# Patient Record
Sex: Female | Born: 1935 | Race: White | Hispanic: No | Smoking: Former smoker
Health system: Southern US, Community
[De-identification: ages and names within clinical notes are randomized; demographics above are authoritative.]

## PROBLEM LIST (undated history)

## (undated) DIAGNOSIS — N189 Chronic kidney disease, unspecified: Secondary | ICD-10-CM

## (undated) DIAGNOSIS — E0590 Thyrotoxicosis, unspecified without thyrotoxic crisis or storm: Secondary | ICD-10-CM

## (undated) DIAGNOSIS — J449 Chronic obstructive pulmonary disease, unspecified: Secondary | ICD-10-CM

## (undated) DIAGNOSIS — K275 Chronic or unspecified peptic ulcer, site unspecified, with perforation: Secondary | ICD-10-CM

## (undated) DIAGNOSIS — G4700 Insomnia, unspecified: Secondary | ICD-10-CM

## (undated) DIAGNOSIS — I10 Essential (primary) hypertension: Secondary | ICD-10-CM

## (undated) HISTORY — DX: Chronic or unspecified peptic ulcer, site unspecified, with perforation: K27.5

## (undated) HISTORY — DX: Chronic kidney disease, unspecified: N18.9

## (undated) HISTORY — PX: TUMOR REMOVAL: SHX12

---

## 2003-03-06 ENCOUNTER — Other Ambulatory Visit: Admission: RE | Admit: 2003-03-06 | Discharge: 2003-03-06 | Payer: Self-pay | Admitting: Family Medicine

## 2003-03-11 ENCOUNTER — Encounter: Payer: Self-pay | Admitting: Family Medicine

## 2003-03-11 ENCOUNTER — Encounter: Admission: RE | Admit: 2003-03-11 | Discharge: 2003-03-11 | Payer: Self-pay | Admitting: Family Medicine

## 2003-05-21 ENCOUNTER — Ambulatory Visit (HOSPITAL_COMMUNITY): Admission: RE | Admit: 2003-05-21 | Discharge: 2003-05-21 | Payer: Self-pay | Admitting: Gastroenterology

## 2003-05-21 ENCOUNTER — Encounter (INDEPENDENT_AMBULATORY_CARE_PROVIDER_SITE_OTHER): Payer: Self-pay

## 2004-07-25 ENCOUNTER — Encounter: Admission: RE | Admit: 2004-07-25 | Discharge: 2004-07-25 | Payer: Self-pay | Admitting: Neurology

## 2004-08-17 ENCOUNTER — Encounter: Admission: RE | Admit: 2004-08-17 | Discharge: 2004-08-17 | Payer: Self-pay | Admitting: Neurology

## 2004-11-06 DIAGNOSIS — D352 Benign neoplasm of pituitary gland: Secondary | ICD-10-CM | POA: Insufficient documentation

## 2004-11-08 ENCOUNTER — Ambulatory Visit: Payer: Self-pay | Admitting: *Deleted

## 2004-11-17 ENCOUNTER — Ambulatory Visit: Payer: Self-pay

## 2004-12-19 ENCOUNTER — Inpatient Hospital Stay (HOSPITAL_COMMUNITY): Admission: RE | Admit: 2004-12-19 | Discharge: 2004-12-23 | Payer: Self-pay | Admitting: Neurosurgery

## 2004-12-19 ENCOUNTER — Encounter (INDEPENDENT_AMBULATORY_CARE_PROVIDER_SITE_OTHER): Payer: Self-pay | Admitting: Specialist

## 2004-12-21 ENCOUNTER — Ambulatory Visit: Payer: Self-pay | Admitting: Internal Medicine

## 2004-12-21 ENCOUNTER — Encounter: Payer: Self-pay | Admitting: Cardiology

## 2005-03-08 ENCOUNTER — Ambulatory Visit (HOSPITAL_COMMUNITY): Admission: RE | Admit: 2005-03-08 | Discharge: 2005-03-08 | Payer: Self-pay | Admitting: Neurosurgery

## 2005-06-11 ENCOUNTER — Ambulatory Visit (HOSPITAL_COMMUNITY): Admission: RE | Admit: 2005-06-11 | Discharge: 2005-06-11 | Payer: Self-pay | Admitting: Neurosurgery

## 2005-08-18 ENCOUNTER — Other Ambulatory Visit: Admission: RE | Admit: 2005-08-18 | Discharge: 2005-08-18 | Payer: Self-pay | Admitting: Family Medicine

## 2005-12-12 ENCOUNTER — Ambulatory Visit (HOSPITAL_COMMUNITY): Admission: RE | Admit: 2005-12-12 | Discharge: 2005-12-12 | Payer: Self-pay | Admitting: Neurosurgery

## 2006-02-27 ENCOUNTER — Encounter: Admission: RE | Admit: 2006-02-27 | Discharge: 2006-02-27 | Payer: Self-pay | Admitting: Family Medicine

## 2006-08-25 ENCOUNTER — Encounter: Admission: RE | Admit: 2006-08-25 | Discharge: 2006-08-25 | Payer: Self-pay | Admitting: Neurosurgery

## 2006-12-11 ENCOUNTER — Encounter: Admission: RE | Admit: 2006-12-11 | Discharge: 2006-12-11 | Payer: Self-pay | Admitting: Endocrinology

## 2007-10-14 ENCOUNTER — Encounter: Admission: RE | Admit: 2007-10-14 | Discharge: 2007-10-14 | Payer: Self-pay | Admitting: Neurosurgery

## 2008-10-18 ENCOUNTER — Encounter: Admission: RE | Admit: 2008-10-18 | Discharge: 2008-10-18 | Payer: Self-pay | Admitting: Neurosurgery

## 2009-07-08 ENCOUNTER — Encounter: Admission: RE | Admit: 2009-07-08 | Discharge: 2009-10-06 | Payer: Self-pay | Admitting: Neurology

## 2010-02-26 ENCOUNTER — Encounter: Admission: RE | Admit: 2010-02-26 | Discharge: 2010-02-26 | Payer: Self-pay | Admitting: Neurosurgery

## 2010-08-30 ENCOUNTER — Encounter: Admission: RE | Admit: 2010-08-30 | Discharge: 2010-08-30 | Payer: Self-pay | Admitting: Neurosurgery

## 2010-11-28 ENCOUNTER — Encounter: Payer: Self-pay | Admitting: Family Medicine

## 2010-12-12 LAB — HEMOGLOBIN A1C: Hgb A1c MFr Bld: 5.3 % (ref 4.0–6.0)

## 2011-01-21 ENCOUNTER — Encounter: Payer: Self-pay | Admitting: Nurse Practitioner

## 2011-01-21 DIAGNOSIS — K635 Polyp of colon: Secondary | ICD-10-CM

## 2011-01-21 DIAGNOSIS — E210 Primary hyperparathyroidism: Secondary | ICD-10-CM

## 2011-01-21 DIAGNOSIS — D352 Benign neoplasm of pituitary gland: Secondary | ICD-10-CM

## 2011-01-21 DIAGNOSIS — E785 Hyperlipidemia, unspecified: Secondary | ICD-10-CM | POA: Insufficient documentation

## 2011-01-21 DIAGNOSIS — I10 Essential (primary) hypertension: Secondary | ICD-10-CM

## 2011-01-21 DIAGNOSIS — E119 Type 2 diabetes mellitus without complications: Secondary | ICD-10-CM | POA: Insufficient documentation

## 2011-03-24 NOTE — Op Note (Signed)
April [name], April [name]                           ACCOUNT NO.:  [id number]   MEDICAL RECORD NO.:  [id number]                   PATIENT TYPE:  AMB   LOCATION:  ENDO                                 FACILITY:  [facility]   PHYSICIAN:  Petra Kuba, M.D.                 DATE OF BIRTH:  04/28/1935   DATE OF PROCEDURE:  05/21/2003  DATE OF DISCHARGE:                                 OPERATIVE REPORT   PROCEDURE PERFORMED:  Colonoscopy.   INDICATIONS FOR PROCEDURE:  A patient with constipation due for colonic  screening.  Consent was signed after risks and benefits, methods and options  were thoroughly discussed in the office.   MEDICINES USED:  Demerol 50, Versed 5.   DESCRIPTION OF PROCEDURE:  The rectal inspection was pertinent for external  hemorrhoids, small.  Digital exam was negative.  The video pediatric  adjustable colonoscope was inserted.  There was some difficulty due to  looping.  It was able to be advanced to a level of the ileocecal valve.  This required rolling her on her back and some abdominal pressure.  Unfortunately, we could not advance around the ileocecal valve since it took  a sharp turn to the cecal pole.  We did try rolling her in all three  positions and various abdominal pressures.  At that point, we elected to  withdraw.  On slow withdrawal through the colon, the ascending, transverse  and descending and a majority of the sigmoid were all normal.  In the distal  sigmoid and rectum, some tiny hyperplastic-appearing polyps were seen and  they were cold biopsied.  Anorectum both forward and retroflexion confirmed  some hemorrhoids.  The air was suctioned.  The scope removed.  The patient  tolerated the procedure well.  There was no obvious immediate complication.   ENDOSCOPIC DIAGNOSES:  1. Internal and external hemorrhoids.  2. Hyperplastic-appearing rectal distal sigmoid polyp, cold biopsied.  3. Otherwise within normal limits to the ileocecal valve.  Unable to   advanced in the cecum.    PLAN:  Await pathology to determine future colonic screening.  Consider an  air-contrast barium enema p.r.n. or if repeat colonoscopy in the future is  due, is unsuccessful.  Will be happy to see back p.r.n. otherwise for her  chronic constipation, would leave the following suggestions with Dr. Reola Calkins to  include MiraLax, Crystallose or even a trial of Zelnorm and will be happy to  see back as above.                                                 Petra Kuba, M.D.    MEM/MEDQ  D:  05/21/2003  T:  05/21/2003  Job:  [id number]   cc:   Tawanna Cooler  Reola Calkins, MD  [phone] [address]  [place]  [place] [address]  Fax: [phone]

## 2011-03-24 NOTE — Consult Note (Signed)
NAMEOPLE, GIRGIS                 ACCOUNT NO.:  [id number]   MEDICAL RECORD NO.:  [id number]          PATIENT TYPE:  INP   LOCATION:  3113                         FACILITY:  [facility]   PHYSICIAN:  Arvilla Meres, M.D. [facility] OF BIRTH:  [birth year]   DATE OF CONSULTATION:  12/21/2004  DATE OF DISCHARGE:                                   CONSULTATION   CONSULTING PHYSICIAN:  Arvilla Meres, M.D. [facility]   REQUESTING PHYSICIAN:  Hewitt Shorts, M.D.   PRIMARY CARE PHYSICIAN:  Dr. Lindaann Pascal, P.A.C.   CARDIOLOGIST:  Cecil Cranker, M.D.   PATIENT IDENTIFICATION:  Ms. Duncombe is a very pleasant 76 year old smoker  with no known history of CAD who we are asked to evaluate for asymptomatic  bradycardia in the setting of a recent transsphenoidal resection of a  pituitary adenoma.   Mr. Stanard denies any history of coronary disease or conduction disorders.  She was seen in our office, in January 2006, for preoperative cardiac  clearance prior to her resection of her adenoma.  At that time she underwent  an adenosine Cardiolite which showed an EF of 77% with no evidence of  infarct or ischemia and thus she was cleared for surgery.  She underwent her  pituitary adenoma resection yesterday without complication.  Postoperatively, she developed significant bradycardia with heart rates in  the 40-50 range, dropping down to the mid 30s while she was sleeping.  These  were totally asymptomatic.  There was no evidence of associated apnea.  She  denies any previous history of syncope or presyncope.  She is not receiving  any A-V nodal blockers.   PAST MEDICAL HISTORY:  1.  Pituitary adenoma, status post resection on December 19, 2004.  2.  Hypertension.  3.  Hyperlipidemia.  4.  Gallstones, status post cholecystectomy.   CURRENT MEDICATIONS:  1.  Lisinopril 10 mg a day.  2.  Hydrochlorothiazide 12.5 mg a day.  3.  Advicor.  4.  Pepcid IV q.12h.  5.  Rocephin.  6.  IV fluids.   She  has no known drug allergies.   SOCIAL HISTORY:  She lives in [place] alone.  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  She has  a history of tobacco, one pack per day x 45 years, quit last week.  No  alcohol.   She does have a significant family history of coronary disease.   REVIEW OF SYSTEMS:  Essentially negative.  She does have occasional  headaches.  She does snore but denies any history of sleep apnea.   PHYSICAL EXAMINATION:  GENERAL:  She is lying in bed in no acute distress.  VITAL SIGNS:  Temperature is 97.3, blood pressure is 124/36, her heart rate  is 48 and ranges between about 37 and 70.  HEENT:  Sclerae are anicteric.  EOMI.  She has nasal packing.  NECK:  Supple.  There is no JVD.  She has soft carotid bruits bilaterally  which are likely radiated form her aortic valve.  CARDIAC:  She has a regular rate and rhythm with a soft 2/6 early peaking  murmur at the right sternal border.  Her ST is well preserved.  There is no  rub or gallop.  LUNGS:  Clear to auscultation.  ABDOMEN:  Soft, nontender, nondistended.  EXTREMITIES:  Warm with no cyanosis, clubbing, or edema.  Her distal pulses  are 1+ bilaterally.  MENTAL STATUS:  She is alert and oriented  x 3.  She is otherwise intact.   LABS:  Show a white count of 10.2, hemoglobin 13.6, hematocrit 40%,  platelets are 226.  Sodium is 141, potassium 4.4, chloride is 112, bicarb is  26, BUN is 10, creatinine is 1.0, and glucose is 162.  EKG, from November 08, 2004, shows a normal sinus rhythm at a rate of 64 with normal intervals.  There is no ST-T wave changes.   ASSESSMENT/PLAN:  Ms. Glendenning is a very pleasant 76 year old woman as above  with no known coronary disease or a history of conduction disorders.  She is  now experiencing some asymptomatic bradycardia with heart rates down into  the 30s, status post a recent resection of a pituitary adenoma.  At this  point, I think her bradycardia is likely benign.  However, I would watch  very  closely for other signs of increased intracranial pressure as this may  be an early warning sign.  Otherwise I would just continue to follow her  conservatively on telemetry.  I would be sure to hold all A-V nodal blockers  such as beta-blockers and calcium channel blockers.  I would also recommend  checking an echocardiogram as well as carotid Doppler studies to evaluate  her bruits and check a TSH.  There is no indication for a pacemaker at this  time.  Finally, I would keep her on telemetry and keep Zoll pads and  atropine at the bedside.   RECOMMENDATIONS:  1.  Continue to monitor on telemetry.  2.  Keep Zoll pads and atropine at the bedside.  3.  Watch closely for other signs of increased intracranial pressure.  4.  Check echocardiogram, carotid ultrasound, and TSH.  5.  Hold all A-V nodal blockers.  6.  We will continue to follow with you.      DB/MEDQ  D:  12/21/2004  T:  12/21/2004  Job:  [id number]   cc:   Hewitt Shorts, M.D.  [address]  [place]  [place] [address]  Fax: [phone]   Lindaann Pascal, P.A.C.   Cecil Cranker, M.D.

## 2011-03-24 NOTE — Op Note (Signed)
NAMELAVELLE, BERLAND                 ACCOUNT NO.:  [id number]   MEDICAL RECORD NO.:  [id number]          PATIENT TYPE:  INP   LOCATION:  2899                         FACILITY:  [facility]   PHYSICIAN:  Zola Button T. Lazarus Salines, M.D. DATE OF BIRTH:  Oct 12, 1935   DATE OF PROCEDURE:  12/19/2004  DATE OF DISCHARGE:                                 OPERATIVE REPORT   PREOPERATIVE DIAGNOSIS:  Pituitary tumor.   POSTOPERATIVE DIAGNOSIS:  Pituitary tumor.   PROCEDURE PERFORMED:  Otolaryngology: Transnasal, transseptal,  transsphenoidal approach to a pituitary tumor.   SURGEON:  Gloris Manchester. Lazarus Salines, M.D.   ANESTHESIA:  General orotracheal.   BLOOD LOSS:  Minimal.   COMPLICATIONS:  None.   FINDINGS:  An overall relatively small fleshy nose. Straight septum. A small  sphenoid sinus with a straight midline intrasinus septum.  An enlarged sella  with bone breakdown on the anterior and inferior faces of the sella on both  sides of the midline.   PROCEDURE:  With the patient in the comfortable supine position, general  orotracheal anesthesia was induced without difficulty.  At an appropriate  level, the table was turned 90 degrees. The patient placed a slight sitting  position. The patient was appropriate position and the C-arm apparatus  placed and aimed.   A saline moistened throat pack was placed. Nasal vibrissae were trimmed.  Cocaine crystals, 200 milligrams total were applied on cotton carriers to  the anterior ethmoid and sphenopalatine ganglion regions on both sides.  Cocaine solution, 160 milligrams total was applied on 1/2 x 3 inch  cottonoids to the nasal septum on both sides. 1% Xylocaine with 1:100,000  epinephrine, 12 cc total infiltrated into the anterior floor of the nose,  into the nasal spine region, into the membranous columella, and into the sub  mucoperichondrial plane of the septum on both sides. Several minutes were  allowed to this to take effect. A sterile preparation and draping  of the  midface, and the right lower quadrant of the abdomen were prepared.   The materials were removed from the nose and observed to be intact and  correct in number. Findings were as described above. A left-sided  hemitransfixion incision was sharply executed and carried down to a floor  tunnel. This was carried to the caudal ends of the quadrangular cartilage. A  small right floor incision was executed. Floor tunnels were generated on  both sides, brought back to the vomer and brought forward. The sub  mucoperichondrial plane of the left septum was elevated back onto the  perpendicular plate and brought down and communicated with the floor tunnel  and brought forward along the maxillary crest. The left septal flap was  elevated intact. The chondroethmoid junction was identified and opened with  the Cottle elevator and the opposite submucoperiosteal plane of the septum  was developed. The superior perpendicular plate was lysed with an open  Jansen-Middleton forceps. Inferior portion was submucosally dissected,  rocked free with a closed Jansen-Middleton forceps and delivered. At this  point, the septum was dislocated from the maxillary crest. The floor tunnel  on  the left from the right side was communicated with the septal  dislocation. Septum was replaced.   The columella was cleaned and using a preexisting skin wrinkle, an incision  was planned at the root of the columella and then sharply executed. This was  communicated in the left nasal vestibule with the floor incision and on the  right side with a hemitransfixion incision. The columella was amputated  across the lower lateral cartilages. The area in the nasal spine was  dissected and at this point the columella and septum were swung to the right  and the septal flap was swung to the left. Small amount of cautery rendered  this region hemostatic.   The short Hardy retractor was passed into the nose and the mucosa was   dissected away from the bony face of the sphenoid sinus. The natural ostia  were identified. Using mallet and osteotome, the rostrum of the sphenoid was  opened and delivered in one piece. It was saved for later use. The bony  aperture into the sphenoid sinus was enlarged using Kerrison forceps in  various directions. The mucosa was dissected off of the midline septum and  then back onto the face of the sella. The bone was dehiscent and the mucosa  was adherent to the dura. The midline intrasinus septum was broken down with  a pituitary forceps. Upon stripping the mucosa into the lateral recesses as  much as possible, the procedure was turned over to neurosurgery for actual  opening of the sella and evacuation of the tumor.   After completion of the neurosurgical portion of the procedure, including  packing of the sella with fat and securing this with a piece of septal  cartilage, the procedure was returned to otolaryngology. The sphenoid sinus  was suctioned clear and additional fat was packed into both sides. The  rostrum was replaced in the midline. Septal tunnels were suctioned clear.  The Hardy retractor was relaxed and removed. The septal flaps were brought  back down. The septum was secured to the nasal spine with a figure-of-eight  4-0 PDS suture.   The columella was carefully cleaned and the base was reapproximated with  buried 4-0 chromic sutures and finally 6-0 Ethilon sutures externally for  cosmetic purposes and 4-0 chromic sutures into the nasal vestibule. A small  incision was made in the posterior left septal flap to allow drainage of  small amounts of blood. At this point the intranasal incisions were closed  with interrupted 4-0 chromic suture. The septum was straight and in the  midline. Hemostasis was observed.   A triple thickness Telfa pack impregnated with bacitracin ointment and a 2-0 silk tag was placed high in the nose on each side between the middle  turbinate  and septum. The same material was used to make a larger pack  inferiorly on both sides.   Note that before placing the nasal packs, a piece of 0.040 reinforced  Silastic was fashioned and placed against the septum on both sides and  secured thereto with a 3-0 Ethilon stitch.   At this point the procedure was completed. The pharynx was suctioned free  and the throat pack was removed. The patient was returned to Anesthesia,  awakened, extubated, and transferred to recovery in stable condition.   COMMENT:  A 76 year old white female found to have a large pituitary tumor  while undergoing workup for possible early Parkinsons disease hence the  indication for today's procedure. Anticipate a routine postoperative  recovery  with attention to ice, elevation, analgesia, and antistaphylococcal  antibiosis. Will leave the sutures and nasal packs in place for several days  and the septal splints for 10 days.      KTW/MEDQ  D:  12/19/2004  T:  12/19/2004  Job:  [id number]   cc:   Hewitt Shorts, M.D.  [address]  [place]  [place] [address]  Fax: [phone]   Dr. Annamary Rummage, M.D.  [address]  [place]  [place] [address]  Fax: [phone]   Pramod P. Pearlean Brownie, MD  Fax: [phone]   Tyrone Apple. Karleen Hampshire, M.D.  [address]  [place]  [place] [address]  Fax: [phone]

## 2011-03-24 NOTE — Discharge Summary (Signed)
NAMEMARGHERITA, [name]                 ACCOUNT NO.:  [id number]   MEDICAL RECORD NO.:  [id number]          PATIENT TYPE:  INP   LOCATION:  3020                         FACILITY:  [facility]   PHYSICIAN:  Hewitt Shorts, M.D.DATE OF BIRTH:  12-Mar-1935   DATE OF ADMISSION:  12/19/2004  DATE OF DISCHARGE:  12/23/2004                                 DISCHARGE SUMMARY   Patient is a 76 year old woman who presents with a pituitary macroadenoma.  This was found while she was undergoing a work-up for early parkinsonism.  The lesion was 2 x 3 x 2 cm extending with mostly growth caudally, although  extending into the suprasellar cistern and abutted, but did not compress the  optic apparatus.  Patient was seen in new ophthalmologic evaluation by Dr.  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  Dr. Lucianne Muss evaluated  her and found on evidence of hypersecretion or hyposecretion.  She underwent  preoperative cardiology clearance with Dr. Graceann Congress and was cleared  for surgery from a cardiac perspective.  Her history is notable for  hypertension, hypercholesterolemia, parkinsonism, and her pituitary tumor.   PREOPERATIVE MEDICATIONS:  1.  Zestoretic 10/12.5 daily.  2.  Advicor 500/20 q.h.s.   PHYSICAL EXAMINATION:  GENERAL:  Unremarkable.  NEUROLOGIC:  Patient neurologically intact other than for some peripheral  visual field encroachment.   HOSPITAL COURSE:  Patient was admitted, underwent transsphenoidal resection  of pituitary tumor.  This procedure was performed in a combined fashion  between the neurosurgical and ENT services, specifically Dr. Newell Coral and  Dr. Lazarus Salines.  Procedure went well.  Postoperatively patient was cared for in  the intensive care unit.  She had no evidence of diabetes insipidus and her  electrolytes remained stable through the hospital course.  However, she did  have some asymptomatic bradycardia with heart rates into the 40s and [facility] was  reconsulted and she was seen by Dr. Gala Romney.  She underwent  a number of laboratories including bilateral carotid Dopplers which showed  no evidence of stenosis.  She had normal TSH.  She had a cardiac  echocardiogram which showed ejection fraction of 55-65 and it is felt that  she had asymptomatic bradycardia which particular slowing during sleep but  no further intervention was recommended by her cardiologist.  She has had  little in the way of drainage onto the nasal drip pad.  She has been up and  actively walking in the halls.  She is afebrile.  Dr. Lazarus Salines wants to  remove her packings on February 20, that is one week following surgery and  she has been instructed to return to his office that day for removal of the  packings.  He has asked that antibiotics be administered until the packings  were removed and she has been given a prescription for Keflex 500 mg t.i.d.  15 tablets and no refills.  She has little in the way of discomfort and we  have advised that she Tylenol or Advil for discomfort.  We have discussed  activities following discharge.   DISCHARGE DIAGNOSES:  1.  Pituitary macroadenoma.  2.  Asymptomatic bradycardia.      RWN/MEDQ  D:  12/23/2004  T:  12/23/2004  Job:  [id number]   cc:   Zola Button T. Lazarus Salines, M.D.  [address] [place]  [place] [address]  Fax: [phone]   Cecil Cranker, M.D.

## 2011-03-24 NOTE — H&P (Signed)
NAMEABBEY, VEITH                 ACCOUNT NO.:  [id number]   MEDICAL RECORD NO.:  [id number]          PATIENT TYPE:  INP   LOCATION:  NA                           FACILITY:  [facility]   PHYSICIAN:  Hewitt Shorts, M.D.DATE OF BIRTH:  [birth year]   DATE OF ADMISSION:  DATE OF DISCHARGE:                                HISTORY & PHYSICAL   HISTORY OF PRESENT ILLNESS:  The patient is a 76 year old right-handed white  female who was evaluated for a pituitary macroadenoma.  She had been  undergoing a workup for early parkinsonism, including MRI of the brain,  which revealed the pituitary macroadenoma.  A dedicated MRI of the sella was  then performed, which demonstrated a lesion larger than 2 x 3 x 2 cm.  Most  of the growth extended caudally, although it certainly extended into the  suprasellar cistern and abuts into the space of the optic apparatus,  especially on the left side.  The patient describes some visual blurring  that bothers her near vision more than her distant vision that has been  going on for a year or two.  She describes some headaches, which may be just  related to tension due to the recent diagnosis and need for medical  attention.  Her weight is stable.  She denies any breast discharge or cold  intolerance.   The patient has undergone evaluation with Dr. Lazarus Salines for preoperative ENT  evaluation as well as with Dr. Lucianne Muss for endocrinologic evaluation and has  undergone neurophthalmologic evaluation.  She is admitted now for surgical  resection of her tumor.   PAST MEDICAL HISTORY:  Notable for a history of hypertension and  hypercholesterolemia, parkinsonism and a pituitary tumor, but no history of  myocardial infarction, cancer, stroke, diabetes, peptic ulcer disease or  lung disease.   Previous surgery includes a cholecystectomy in 1985.   She denies allergies to medications.   CURRENT MEDICATIONS:  1.  Zestoretic 10/12.5 mg one tablet daily.  2.  Advicor  500/20 mg one q.h.s.   FAMILY HISTORY:  Her mother died at age 13.  She had a stroke, heart attack  and diabetes.  Father died in his 77s of heart attack.   SOCIAL HISTORY:  The patient is divorced.  She smokes a pack a day.  She has  been smoking over 25 years.  She does not drink alcohol.  She denies a  history of substance abuse.   REVIEW OF SYSTEMS:  Notable for those as described in history of present  illness and past medical history, but her 14-point review of system sheet is  otherwise unremarkable.   PHYSICAL EXAMINATION:  GENERAL:  The patient is a well-developed, well-  nourished white female in no acute distress.  VITAL SIGNS:  Her height is 5 feet, weight 155 pounds.  Temperature is 98.1,  pulse 69, blood pressure 122/72, respiratory rate 18.  CHEST:  Lungs are clear to auscultation.  She has symmetrical respiratory  excursion.  CARDIAC:  Regular rate and rhythm, S1, S2, no murmur.  ABDOMEN:  Soft, nondistended, bowel sounds are  present.  EXTREMITIES:  No clubbing, cyanosis, or edema.  NEUROLOGIC:  Mental status:  The patient is awake, alert, fully oriented.  Speech is fluent, and she has good comprehension.  Cranial nerves reveal her  pupils to be equal, round and reactive to light and about 2.5 mm  bilaterally.  Extraocular movements are intact.  Visual fields may have some  peripheral encroachment, more so on the right than the left.  Facial  movement is symmetrical.  Hearing is present bilaterally.  The palate  movement is symmetrical.  Tongue is midline.  Motor examination shows 5/5  strength.  There is no drift in the upper extremities.  She has normal gait.  She has moderate instability on tandem gait.  Sensation is intact to  pinprick throughout.  Reflexes are symmetrical.   DIAGNOSTIC EVALUATION:  Dr. Aura Camps, the ophthalmologist, found  superior quadrant anopsia.  Dr. Lucianne Muss found no evidence of hypersecretion or  hyposecretion.  Dr. Lazarus Salines felt the  patient was a good candidate for  transsphenoidal resection of the pituitary tumor.   IMPRESSION:  Large pituitary macroadenoma, which extends laterally toward  the cavernous sinus and inferiorly toward the floor of the sphenoid sinus  but superiorly as well into the suprasellar cistern and abutting and  compressing the optic apparatus, more on the left than the right.   PLAN:  The patient will be admitted for transsphenoidal resection of  pituitary tumor done in conjunction with Dr. Lazarus Salines from the ENT service.  She understands that this may well be a staged procedure depending on our  ability to resect the most superior aspect of the tumor and if significant  residual tumor remains in the suprasellar region, a transcranial approach  through a left pterional craniotomy may be necessary.  The patient has  undergone preoperative cardiology clearance with [facility].  We  discussed the nature of surgery, typical surgery, hospitalization, overall  recuperation and limitations postoperatively, the need for postoperative  care in the intensive care unit, and risks of surgery including risks of  infection, bleeding, the possible need of transfusion, the risk of  neurologic dysfunction with loss of vision, paralysis and coma, the risk of  endocrinopathy with possible need for hormone replacement, and the risk of  CSF leak and possible need for further surgery, including the possibility of  craniotomy, the possible need for adjuvant therapy with radiation therapy,  and anesthetic risks of myocardial infarction, stroke, pneumonia and death.   After discussing all risks, she would like to ahead with surgery and is  admitted for such.      RWN/MEDQ  D:  12/19/2004  T:  12/19/2004  Job:  [id number]   cc:   Zola Button T. Lazarus Salines, M.D.  [address] [place]  [place] [address]  Fax: [phone]

## 2011-03-24 NOTE — Op Note (Signed)
NAMEODEAN, MCELWAIN                 ACCOUNT NO.:  [id number]   MEDICAL RECORD NO.:  [id number]          PATIENT TYPE:  INP   LOCATION:  2899                         FACILITY:  [facility]   PHYSICIAN:  Hewitt Shorts, M.D.DATE OF BIRTH:  18-Nov-1934   DATE OF PROCEDURE:  12/19/2004  DATE OF DISCHARGE:                                 OPERATIVE REPORT   PREOPERATIVE DIAGNOSIS:  Pituitary adenoma.   POSTOPERATIVE DIAGNOSIS:  Pituitary adenoma.   PROCEDURE:  Transsphenoidal resection of pituitary tumor.   SURGEONS:  Hewitt Shorts, M.D.  Gloris Manchester. Lazarus Salines, M.D.   ANESTHESIA:  General endotracheal.   INDICATIONS:  This is a 76 year old woman who was found to have a large  pituitary macroadenoma, with tumor extending caudally into the pseudo sinus  rostrally into the suprasellar cistern, and then laterally into the  cavernous sinuses.  Decision made to proceed with elective transsphenoidal  resection of pituitary tumor.   DESCRIPTION OF PROCEDURE:  The procedure was done in the combined fashion  between the ENT and neurosurgical services; specifically, Drs. Flo Shanks  and Shirlean Kelly.  This is a dictation of the neurosurgical portion of  the procedure.   The patient was brought to the operating room and placed under general  endotracheal anesthesia.  The patient was positioned on a [place] horseshoe  headrest, with the head gently tilted to the left.  Dr. Lazarus Salines prepped the  face and nose, and the staff prepped the patient's abdomen with Betadine  scrubbing solution; all of this was draped in a sterile fashion.  Dr.  Lazarus Salines performed the exposure into the sphenoid sinus, and identified the  expanded sella.  This portion of the procedure is dictated by Dr. Lazarus Salines.  However, he found that the sella itself was markedly expanded and that only  wall of the sella had been eroded and the mucosa at points was adherent to  the dura, and could not be fully removed.   The  microscope had been draped and brought into the field, and provided  additional magnification, illumination and visualization.  Once the exposure  to the sella was completed, I took over as the Careers adviser.  We made a  horizontal incision in the right side of the abdomen, and then dissection  was carried down through the subcutaneous tissue.  Subcutaneous fat was  harvested, to later be used as a graft.  Then hemostasis was established  with the use of electrocautery and then the incision was closed with  interrupted inverted 2-0 Vicryl sutures, closing the subcutaneous tissues  and subcuticular layer.  Dermabond closed the skin edges.   We examined the wall of the sella, much of it had been eroded.  It appeared  that the dura, although expanded, was intact.  The dura was opened in a  cruciate fashion, and tumor immediately extruded into the field.  Specimens  of tumor were collected and sent to pathology for permanent examination.  Then, using a variety of small ring curets, the tumor was resected.  We  first removed tumor caudally to the floor of the sella,  and then gradually  removed tumor laterally to the lateral walls of the sella.  Lastly, removed  tumor superiorly.   It appeared that the thickened arachnoid or diaphragm sellae gently billowed  down, and it is felt that a gross total resection of the visible tumor had  been achieved.  On several occasions, we allowed the cranial pulsations to  gently push down tumor; as this was done the more complete resection of the  tumor was achieved.  We then used some Gelfoam soaked in thrombin to help  establish hemostasis within the tumor cavity.  The Gelfoam was then removed  and replaced some of the subcutaneous fat from the abdomen in the tumor  resection cavity.  Then, a small piece of nasal cartilage was placed inside  of the dural opening to help buttress the subcutaneous fat that had been  implanted within the tumor resection cavity.    Then, the surgical procedure was turned back over to Dr. Lazarus Salines to perform  the closure from this sphenoid sinus exteriorly.   The procedure was tolerated well.   ESTIMATED BLOOD LOSS:  200 cc.   COUNTS:  Sponge and instrument count correct.   DISPOSITION:  Following surgery, the patient was reversed from anesthetic,  extubated and transferred to the recovery room for further care; which she  was drowsy but following commands and moving all four extremities.   SPECIMENS:  Tumor sent to pathology for permanent examination.      RWN/MEDQ  D:  12/19/2004  T:  12/19/2004  Job:  [id number]

## 2012-03-22 ENCOUNTER — Other Ambulatory Visit: Payer: Self-pay | Admitting: Nephrology

## 2012-03-26 ENCOUNTER — Other Ambulatory Visit: Payer: Self-pay

## 2012-03-27 ENCOUNTER — Other Ambulatory Visit (HOSPITAL_COMMUNITY): Payer: Self-pay | Admitting: Nephrology

## 2012-03-27 DIAGNOSIS — D369 Benign neoplasm, unspecified site: Secondary | ICD-10-CM

## 2012-03-28 ENCOUNTER — Ambulatory Visit
Admission: RE | Admit: 2012-03-28 | Discharge: 2012-03-28 | Disposition: A | Discharge status: Home / Self Care | Payer: PRIVATE HEALTH INSURANCE | Source: Ambulatory Visit | Attending: Nephrology | Admitting: Nephrology

## 2012-04-04 ENCOUNTER — Ambulatory Visit (HOSPITAL_COMMUNITY): Payer: Self-pay

## 2012-04-04 ENCOUNTER — Encounter (HOSPITAL_COMMUNITY)
Admission: RE | Admit: 2012-04-04 | Discharge: 2012-04-04 | Disposition: A | Discharge status: Home / Self Care | Payer: PRIVATE HEALTH INSURANCE | Source: Ambulatory Visit | Attending: Nephrology | Admitting: Nephrology

## 2012-04-04 DIAGNOSIS — E213 Hyperparathyroidism, unspecified: Secondary | ICD-10-CM | POA: Insufficient documentation

## 2012-04-04 DIAGNOSIS — D369 Benign neoplasm, unspecified site: Secondary | ICD-10-CM

## 2012-04-04 MED ORDER — TECHNETIUM TC 99M SESTAMIBI - CARDIOLITE
25.0000 | Freq: Once | INTRAVENOUS | Status: AC | PRN
  Administered 2012-04-04: 25 via INTRAVENOUS

## 2012-06-10 ENCOUNTER — Emergency Department (HOSPITAL_COMMUNITY): Payer: PRIVATE HEALTH INSURANCE

## 2012-06-10 ENCOUNTER — Inpatient Hospital Stay (HOSPITAL_COMMUNITY)
Admission: EM | Admit: 2012-06-10 | Discharge: 2012-06-24 | DRG: 326 | Disposition: A | Discharge status: Skilled Nursing Facility | Payer: PRIVATE HEALTH INSURANCE | Attending: General Surgery | Admitting: General Surgery

## 2012-06-10 ENCOUNTER — Encounter (HOSPITAL_COMMUNITY): Payer: Self-pay | Admitting: Anesthesiology

## 2012-06-10 ENCOUNTER — Encounter (HOSPITAL_COMMUNITY): Admission: EM | Disposition: A | Discharge status: Skilled Nursing Facility | Payer: Self-pay | Source: Home / Self Care

## 2012-06-10 ENCOUNTER — Emergency Department (HOSPITAL_COMMUNITY): Payer: PRIVATE HEALTH INSURANCE | Admitting: Anesthesiology

## 2012-06-10 ENCOUNTER — Encounter (HOSPITAL_COMMUNITY): Payer: Self-pay

## 2012-06-10 DIAGNOSIS — R578 Other shock: Secondary | ICD-10-CM | POA: Diagnosis not present

## 2012-06-10 DIAGNOSIS — R911 Solitary pulmonary nodule: Secondary | ICD-10-CM | POA: Diagnosis present

## 2012-06-10 DIAGNOSIS — J449 Chronic obstructive pulmonary disease, unspecified: Secondary | ICD-10-CM

## 2012-06-10 DIAGNOSIS — D352 Benign neoplasm of pituitary gland: Secondary | ICD-10-CM | POA: Diagnosis present

## 2012-06-10 DIAGNOSIS — J9601 Acute respiratory failure with hypoxia: Secondary | ICD-10-CM

## 2012-06-10 DIAGNOSIS — J95821 Acute postprocedural respiratory failure: Secondary | ICD-10-CM | POA: Diagnosis not present

## 2012-06-10 DIAGNOSIS — N179 Acute kidney failure, unspecified: Secondary | ICD-10-CM | POA: Clinically undetermined

## 2012-06-10 DIAGNOSIS — E213 Hyperparathyroidism, unspecified: Secondary | ICD-10-CM | POA: Diagnosis present

## 2012-06-10 DIAGNOSIS — I10 Essential (primary) hypertension: Secondary | ICD-10-CM | POA: Diagnosis present

## 2012-06-10 DIAGNOSIS — D353 Benign neoplasm of craniopharyngeal duct: Secondary | ICD-10-CM | POA: Diagnosis present

## 2012-06-10 DIAGNOSIS — E46 Unspecified protein-calorie malnutrition: Secondary | ICD-10-CM | POA: Diagnosis not present

## 2012-06-10 DIAGNOSIS — E0590 Thyrotoxicosis, unspecified without thyrotoxic crisis or storm: Secondary | ICD-10-CM | POA: Diagnosis present

## 2012-06-10 DIAGNOSIS — G9340 Encephalopathy, unspecified: Secondary | ICD-10-CM

## 2012-06-10 DIAGNOSIS — G9349 Other encephalopathy: Secondary | ICD-10-CM | POA: Diagnosis not present

## 2012-06-10 DIAGNOSIS — J690 Pneumonitis due to inhalation of food and vomit: Secondary | ICD-10-CM

## 2012-06-10 DIAGNOSIS — N189 Chronic kidney disease, unspecified: Secondary | ICD-10-CM | POA: Diagnosis present

## 2012-06-10 DIAGNOSIS — D126 Benign neoplasm of colon, unspecified: Secondary | ICD-10-CM | POA: Diagnosis present

## 2012-06-10 DIAGNOSIS — D72829 Elevated white blood cell count, unspecified: Secondary | ICD-10-CM | POA: Diagnosis present

## 2012-06-10 DIAGNOSIS — K668 Other specified disorders of peritoneum: Secondary | ICD-10-CM | POA: Diagnosis present

## 2012-06-10 DIAGNOSIS — IMO0002 Reserved for concepts with insufficient information to code with codable children: Secondary | ICD-10-CM

## 2012-06-10 DIAGNOSIS — E119 Type 2 diabetes mellitus without complications: Secondary | ICD-10-CM | POA: Diagnosis present

## 2012-06-10 DIAGNOSIS — K659 Peritonitis, unspecified: Secondary | ICD-10-CM | POA: Diagnosis present

## 2012-06-10 DIAGNOSIS — D6489 Other specified anemias: Secondary | ICD-10-CM | POA: Diagnosis not present

## 2012-06-10 DIAGNOSIS — I519 Heart disease, unspecified: Secondary | ICD-10-CM | POA: Diagnosis present

## 2012-06-10 DIAGNOSIS — K251 Acute gastric ulcer with perforation: Secondary | ICD-10-CM

## 2012-06-10 DIAGNOSIS — E876 Hypokalemia: Secondary | ICD-10-CM | POA: Diagnosis not present

## 2012-06-10 DIAGNOSIS — N183 Chronic kidney disease, stage 3 unspecified: Secondary | ICD-10-CM | POA: Diagnosis present

## 2012-06-10 DIAGNOSIS — Z87891 Personal history of nicotine dependence: Secondary | ICD-10-CM | POA: Diagnosis present

## 2012-06-10 DIAGNOSIS — Z79899 Other long term (current) drug therapy: Secondary | ICD-10-CM

## 2012-06-10 DIAGNOSIS — J4489 Other specified chronic obstructive pulmonary disease: Secondary | ICD-10-CM | POA: Diagnosis present

## 2012-06-10 DIAGNOSIS — E860 Dehydration: Secondary | ICD-10-CM

## 2012-06-10 DIAGNOSIS — R0902 Hypoxemia: Secondary | ICD-10-CM | POA: Diagnosis not present

## 2012-06-10 DIAGNOSIS — I129 Hypertensive chronic kidney disease with stage 1 through stage 4 chronic kidney disease, or unspecified chronic kidney disease: Secondary | ICD-10-CM | POA: Diagnosis present

## 2012-06-10 DIAGNOSIS — I9589 Other hypotension: Secondary | ICD-10-CM | POA: Diagnosis not present

## 2012-06-10 DIAGNOSIS — K255 Chronic or unspecified gastric ulcer with perforation: Principal | ICD-10-CM | POA: Diagnosis present

## 2012-06-10 DIAGNOSIS — G8918 Other acute postprocedural pain: Secondary | ICD-10-CM | POA: Diagnosis not present

## 2012-06-10 DIAGNOSIS — E210 Primary hyperparathyroidism: Secondary | ICD-10-CM

## 2012-06-10 DIAGNOSIS — E785 Hyperlipidemia, unspecified: Secondary | ICD-10-CM | POA: Diagnosis present

## 2012-06-10 DIAGNOSIS — R5381 Other malaise: Secondary | ICD-10-CM | POA: Diagnosis not present

## 2012-06-10 HISTORY — DX: Thyrotoxicosis, unspecified without thyrotoxic crisis or storm: E05.90

## 2012-06-10 HISTORY — PX: LAPAROTOMY: SHX154

## 2012-06-10 HISTORY — DX: Essential (primary) hypertension: I10

## 2012-06-10 LAB — CBC WITH DIFFERENTIAL/PLATELET
Basophils Absolute: 0 10*3/uL (ref 0.0–0.1)
Basophils Relative: 0 % (ref 0–1)
MCHC: 33.8 g/dL (ref 30.0–36.0)
Monocytes Absolute: 0.5 10*3/uL (ref 0.1–1.0)
Neutro Abs: 5.1 10*3/uL (ref 1.7–7.7)
Neutrophils Relative %: 60 % (ref 43–77)
Platelets: 275 10*3/uL (ref 150–400)
RDW: 13.4 % (ref 11.5–15.5)

## 2012-06-10 LAB — URINALYSIS, ROUTINE W REFLEX MICROSCOPIC
Leukocytes, UA: NEGATIVE
Nitrite: NEGATIVE
Specific Gravity, Urine: 1.02 (ref 1.005–1.030)
pH: 5.5 (ref 5.0–8.0)

## 2012-06-10 LAB — POCT I-STAT, CHEM 8
BUN: 30 mg/dL — ABNORMAL HIGH (ref 6–23)
Calcium, Ion: 1.56 mmol/L — ABNORMAL HIGH (ref 1.13–1.30)
Creatinine, Ser: 1.7 mg/dL — ABNORMAL HIGH (ref 0.50–1.10)
TCO2: 21 mmol/L (ref 0–100)

## 2012-06-10 LAB — PROTIME-INR: INR: 0.93 (ref 0.00–1.49)

## 2012-06-10 LAB — GLUCOSE, CAPILLARY
Glucose-Capillary: 131 mg/dL — ABNORMAL HIGH (ref 70–99)
Glucose-Capillary: 189 mg/dL — ABNORMAL HIGH (ref 70–99)

## 2012-06-10 LAB — LACTIC ACID, PLASMA: Lactic Acid, Venous: 4 mmol/L — ABNORMAL HIGH (ref 0.5–2.2)

## 2012-06-10 LAB — COMPREHENSIVE METABOLIC PANEL
AST: 13 U/L (ref 0–37)
Albumin: 3.4 g/dL — ABNORMAL LOW (ref 3.5–5.2)
Chloride: 104 mEq/L (ref 96–112)
Creatinine, Ser: 1.95 mg/dL — ABNORMAL HIGH (ref 0.50–1.10)
Sodium: 140 mEq/L (ref 135–145)
Total Bilirubin: 0.3 mg/dL (ref 0.3–1.2)

## 2012-06-10 LAB — POCT I-STAT TROPONIN I: Troponin i, poc: 0.01 ng/mL (ref 0.00–0.08)

## 2012-06-10 SURGERY — LAPAROTOMY, EXPLORATORY
Anesthesia: General | Site: Abdomen | Wound class: Dirty or Infected

## 2012-06-10 MED ORDER — IOHEXOL 350 MG/ML SOLN
100.0000 mL | Freq: Once | INTRAVENOUS | Status: AC | PRN
Start: 2012-06-10 — End: 2012-06-10
  Administered 2012-06-10: 75 mL via INTRAVENOUS

## 2012-06-10 MED ORDER — GLYCOPYRROLATE 0.2 MG/ML IJ SOLN
INTRAMUSCULAR | Status: DC | PRN
  Administered 2012-06-10: .4 mg via INTRAVENOUS

## 2012-06-10 MED ORDER — SODIUM CHLORIDE 0.9 % IV SOLN
1000.0000 mL | Freq: Once | INTRAVENOUS | Status: AC
  Administered 2012-06-10: 1000 mL via INTRAVENOUS

## 2012-06-10 MED ORDER — PROMETHAZINE HCL 25 MG/ML IJ SOLN: 6.2500 mg | INTRAMUSCULAR | Status: DC | PRN

## 2012-06-10 MED ORDER — FENTANYL CITRATE 0.05 MG/ML IJ SOLN
INTRAMUSCULAR | Status: DC | PRN
  Administered 2012-06-10: 25 ug via INTRAVENOUS
  Administered 2012-06-10: 50 ug via INTRAVENOUS
  Administered 2012-06-10: 25 ug via INTRAVENOUS
  Administered 2012-06-10 (×4): 50 ug via INTRAVENOUS

## 2012-06-10 MED ORDER — ONDANSETRON HCL 4 MG/2ML IJ SOLN
INTRAMUSCULAR | Status: DC | PRN
  Administered 2012-06-10: 4 mg via INTRAVENOUS

## 2012-06-10 MED ORDER — CISATRACURIUM BESYLATE (PF) 10 MG/5ML IV SOLN
INTRAVENOUS | Status: DC | PRN
  Administered 2012-06-10: 4 mg via INTRAVENOUS

## 2012-06-10 MED ORDER — INSULIN ASPART 100 UNIT/ML ~~LOC~~ SOLN
0.0000 [IU] | SUBCUTANEOUS | Status: DC
  Administered 2012-06-11 – 2012-06-12 (×3): 2 [IU] via SUBCUTANEOUS
  Administered 2012-06-13: 3 [IU] via SUBCUTANEOUS
  Administered 2012-06-13: 2 [IU] via SUBCUTANEOUS
  Administered 2012-06-14: 5 [IU] via SUBCUTANEOUS
  Administered 2012-06-14: 3 [IU] via SUBCUTANEOUS
  Administered 2012-06-14: 2 [IU] via SUBCUTANEOUS
  Administered 2012-06-14: 3 [IU] via SUBCUTANEOUS
  Administered 2012-06-14: 2 [IU] via SUBCUTANEOUS
  Administered 2012-06-15 (×4): 5 [IU] via SUBCUTANEOUS
  Administered 2012-06-15: 3 [IU] via SUBCUTANEOUS
  Administered 2012-06-15 – 2012-06-16 (×2): 5 [IU] via SUBCUTANEOUS
  Administered 2012-06-16 (×2): 3 [IU] via SUBCUTANEOUS
  Administered 2012-06-16: 5 [IU] via SUBCUTANEOUS
  Administered 2012-06-16: 3 [IU] via SUBCUTANEOUS
  Administered 2012-06-16: 5 [IU] via SUBCUTANEOUS
  Administered 2012-06-17: 3 [IU] via SUBCUTANEOUS
  Administered 2012-06-17: 5 [IU] via SUBCUTANEOUS
  Administered 2012-06-17 (×3): 3 [IU] via SUBCUTANEOUS
  Administered 2012-06-17 – 2012-06-19 (×8): 2 [IU] via SUBCUTANEOUS
  Administered 2012-06-20 (×2): 3 [IU] via SUBCUTANEOUS
  Administered 2012-06-20 (×2): 2 [IU] via SUBCUTANEOUS
  Administered 2012-06-21: 3 [IU] via SUBCUTANEOUS
  Administered 2012-06-21: 1 [IU] via SUBCUTANEOUS
  Administered 2012-06-21 – 2012-06-23 (×7): 2 [IU] via SUBCUTANEOUS

## 2012-06-10 MED ORDER — NEOSTIGMINE METHYLSULFATE 1 MG/ML IJ SOLN
INTRAMUSCULAR | Status: DC | PRN
  Administered 2012-06-10: 3 mg via INTRAVENOUS

## 2012-06-10 MED ORDER — FENTANYL CITRATE 0.05 MG/ML IJ SOLN
INTRAMUSCULAR | Status: AC
  Filled 2012-06-10: qty 2

## 2012-06-10 MED ORDER — LACTATED RINGERS IV SOLN
INTRAVENOUS | Status: DC | PRN
  Administered 2012-06-10 (×2): via INTRAVENOUS

## 2012-06-10 MED ORDER — FENTANYL CITRATE 0.05 MG/ML IJ SOLN
50.0000 ug | Freq: Once | INTRAMUSCULAR | Status: AC
  Administered 2012-06-10: 50 ug via INTRAVENOUS
  Filled 2012-06-10: qty 2

## 2012-06-10 MED ORDER — EPHEDRINE SULFATE 50 MG/ML IJ SOLN
INTRAMUSCULAR | Status: DC | PRN
  Administered 2012-06-10: 10 mg via INTRAVENOUS

## 2012-06-10 MED ORDER — SUCCINYLCHOLINE CHLORIDE 20 MG/ML IJ SOLN
INTRAMUSCULAR | Status: DC | PRN
  Administered 2012-06-10: 100 mg via INTRAVENOUS

## 2012-06-10 MED ORDER — HYDROMORPHONE HCL PF 1 MG/ML IJ SOLN
0.2500 mg | INTRAMUSCULAR | Status: DC | PRN
  Administered 2012-06-11: 0.5 mg via INTRAVENOUS

## 2012-06-10 MED ORDER — SODIUM CHLORIDE 0.9 % IV SOLN
1000.0000 mL | INTRAVENOUS | Status: DC
  Administered 2012-06-10: 1000 mL via INTRAVENOUS

## 2012-06-10 MED ORDER — ETOMIDATE 2 MG/ML IV SOLN
INTRAVENOUS | Status: DC | PRN
  Administered 2012-06-10: 14 mg via INTRAVENOUS

## 2012-06-10 MED ORDER — SODIUM CHLORIDE 0.9 % IV SOLN
1.0000 g | Freq: Once | INTRAVENOUS | Status: AC
  Administered 2012-06-10: 1 g via INTRAVENOUS
  Filled 2012-06-10: qty 1

## 2012-06-10 SURGICAL SUPPLY — 44 items
APPLICATOR COTTON TIP 6IN STRL (MISCELLANEOUS) ×6 IMPLANT
BANDAGE GAUZE ELAST BULKY 4 IN (GAUZE/BANDAGES/DRESSINGS) ×2 IMPLANT
BLADE EXTENDED COATED 6.5IN (ELECTRODE) ×2 IMPLANT
BLADE HEX COATED 2.75 (ELECTRODE) ×2 IMPLANT
CANISTER SUCTION 2500CC (MISCELLANEOUS) ×2 IMPLANT
CLOTH BEACON ORANGE TIMEOUT ST (SAFETY) ×2 IMPLANT
COVER MAYO STAND STRL (DRAPES) IMPLANT
DRAIN CHANNEL 19F RND (DRAIN) ×2 IMPLANT
DRAPE LAPAROSCOPIC ABDOMINAL (DRAPES) ×2 IMPLANT
DRAPE UTILITY XL STRL (DRAPES) ×2 IMPLANT
DRAPE WARM FLUID 44X44 (DRAPE) IMPLANT
DRSG PAD ABDOMINAL 8X10 ST (GAUZE/BANDAGES/DRESSINGS) ×4 IMPLANT
ELECT REM PT RETURN 9FT ADLT (ELECTROSURGICAL) ×2
ELECTRODE REM PT RTRN 9FT ADLT (ELECTROSURGICAL) ×1 IMPLANT
EVACUATOR SILICONE 100CC (DRAIN) ×2 IMPLANT
GLOVE BIOGEL PI IND STRL 7.0 (GLOVE) ×1 IMPLANT
GLOVE BIOGEL PI INDICATOR 7.0 (GLOVE) ×1
GLOVE ECLIPSE 8.0 STRL XLNG CF (GLOVE) ×2 IMPLANT
GLOVE INDICATOR 8.0 STRL GRN (GLOVE) ×4 IMPLANT
GOWN STRL NON-REIN LRG LVL3 (GOWN DISPOSABLE) ×2 IMPLANT
GOWN STRL REIN XL XLG (GOWN DISPOSABLE) ×4 IMPLANT
KIT BASIN OR (CUSTOM PROCEDURE TRAY) ×2 IMPLANT
NS IRRIG 1000ML POUR BTL (IV SOLUTION) ×4 IMPLANT
PACK GENERAL/GYN (CUSTOM PROCEDURE TRAY) ×2 IMPLANT
SPONGE GAUZE 4X4 12PLY (GAUZE/BANDAGES/DRESSINGS) ×2 IMPLANT
SPONGE LAP 18X18 X RAY DECT (DISPOSABLE) ×4 IMPLANT
STAPLER VISISTAT 35W (STAPLE) ×2 IMPLANT
SUCTION POOLE TIP (SUCTIONS) ×2 IMPLANT
SUT ETHILON 3 0 PS 1 (SUTURE) ×2 IMPLANT
SUT NOVA NAB DX-16 0-1 5-0 T12 (SUTURE) ×4 IMPLANT
SUT PDS AB 1 CTX 36 (SUTURE) IMPLANT
SUT PDS AB 1 TP1 96 (SUTURE) ×6 IMPLANT
SUT SILK 2 0 (SUTURE) ×1
SUT SILK 2 0 SH CR/8 (SUTURE) ×2 IMPLANT
SUT SILK 2-0 18XBRD TIE 12 (SUTURE) ×1 IMPLANT
SUT SILK 3 0 (SUTURE) ×1
SUT SILK 3 0 SH CR/8 (SUTURE) ×2 IMPLANT
SUT SILK 3-0 18XBRD TIE 12 (SUTURE) ×1 IMPLANT
SUT VICRYL 2 0 18  UND BR (SUTURE)
SUT VICRYL 2 0 18 UND BR (SUTURE) IMPLANT
TAPE CLOTH SURG 6X10 WHT LF (GAUZE/BANDAGES/DRESSINGS) ×2 IMPLANT
TOWEL OR 17X26 10 PK STRL BLUE (TOWEL DISPOSABLE) ×4 IMPLANT
TRAY FOLEY CATH 14FRSI W/METER (CATHETERS) IMPLANT
YANKAUER SUCT BULB TIP NO VENT (SUCTIONS) ×2 IMPLANT

## 2012-06-10 NOTE — ED Notes (Signed)
EDP Dr. Alto Denver notified and EDP walking to pt's room

## 2012-06-10 NOTE — H&P (Signed)
April [name] is an 77 y.o. female.   Chief Complaint:   Severe abdominal pain HPI:   She has 2 weeks of abdominal pain. The pain began in the suprapubic region and radiated up to the epigastric region. She was having some sweating with this. She coughed today and had severe diffuse pain and was brought to the emergency department where she was noted to be hypotensive and hypothermic. Her blood pressure normalized with IV fluid resuscitation. A CT scan demonstrated pneumoperitoneum had a lot of free fluid and I was asked to see her because of this. She denies a history of diverticulosis or diverticulitis. She denies peptic ulcer disease.  Her son is at the bedside.  Past Medical History  Diagnosis Date  . Diabetes mellitus   . Hyperthyroidism   . Hypertension     Hypercalcemia   Hyperparathyroidism  Past Surgical History  Procedure Date  . Tumor removal     of her sinus cavity   Open cholecystectomy  No family history on file. Social History:  reports that she has never smoked. She does not have any smokeless tobacco history on file. She reports that she does not drink alcohol or use illicit drugs.  Allergies: No Known Allergies  Prior to Admission medications   Medication Sig Start Date End Date Taking? Authorizing Provider  aspirin 81 MG tablet Take 81 mg by mouth daily.     Yes Historical Provider, MD  atorvastatin (LIPITOR) 20 MG tablet Take 20 mg by mouth daily.     Yes Historical Provider, MD  sitaGLIPtan-metformin (JANUMET) 50-500 MG per tablet Take 1 tablet by mouth 2 (two) times daily with a meal.     Yes Historical Provider, MD    (Not in a hospital admission)  Results for orders placed during the hospital encounter of 06/10/12 (from the past 48 hour(s))  GLUCOSE, CAPILLARY     Status: Abnormal   Collection Time   06/10/12  5:34 PM      Component Value Range Comment   Glucose-Capillary 189 (*) 70 - 99 mg/dL   CBC WITH DIFFERENTIAL     Status: Abnormal   Collection Time     06/10/12  5:45 PM      Component Value Range Comment   WBC 8.4  4.0 - 10.5 K/uL    RBC 4.85  3.87 - 5.11 MIL/uL    Hemoglobin 15.1 (*) 12.0 - 15.0 g/dL    HCT 16.1  09.6 - 04.5 %    MCV 92.2  78.0 - 100.0 fL    MCH 31.1  26.0 - 34.0 pg    MCHC 33.8  30.0 - 36.0 g/dL    RDW 40.9  81.1 - 91.4 %    Platelets 275  150 - 400 K/uL    Neutrophils Relative 60  43 - 77 %    Neutro Abs 5.1  1.7 - 7.7 K/uL    Lymphocytes Relative 33  12 - 46 %    Lymphs Abs 2.8  0.7 - 4.0 K/uL    Monocytes Relative 6  3 - 12 %    Monocytes Absolute 0.5  0.1 - 1.0 K/uL    Eosinophils Relative 1  0 - 5 %    Eosinophils Absolute 0.1  0.0 - 0.7 K/uL    Basophils Relative 0  0 - 1 %    Basophils Absolute 0.0  0.0 - 0.1 K/uL   COMPREHENSIVE METABOLIC PANEL     Status: Abnormal  Collection Time   06/10/12  5:45 PM      Component Value Range Comment   Sodium 140  135 - 145 mEq/L    Potassium 3.4 (*) 3.5 - 5.1 mEq/L    Chloride 104  96 - 112 mEq/L    CO2 22  19 - 32 mEq/L    Glucose, Bld 279 (*) 70 - 99 mg/dL    BUN 27 (*) 6 - 23 mg/dL    Creatinine, Ser 1.61 (*) 0.50 - 1.10 mg/dL    Calcium 09.6 (*) 8.4 - 10.5 mg/dL    Total Protein 6.4  6.0 - 8.3 g/dL    Albumin 3.4 (*) 3.5 - 5.2 g/dL    AST 13  0 - 37 U/L    ALT 9  0 - 35 U/L    Alkaline Phosphatase 91  39 - 117 U/L    Total Bilirubin 0.3  0.3 - 1.2 mg/dL    GFR calc non Af Amer 24 (*) >90 mL/min    GFR calc Af Amer 28 (*) >90 mL/min   PROTIME-INR     Status: Normal   Collection Time   06/10/12  5:45 PM      Component Value Range Comment   Prothrombin Time 12.7  11.6 - 15.2 seconds    INR 0.93  0.00 - 1.49   URINALYSIS, ROUTINE W REFLEX MICROSCOPIC     Status: Abnormal   Collection Time   06/10/12  5:50 PM      Component Value Range Comment   Color, Urine YELLOW  YELLOW    APPearance CLOUDY (*) CLEAR    Specific Gravity, Urine 1.020  1.005 - 1.030    pH 5.5  5.0 - 8.0    Glucose, UA NEGATIVE  NEGATIVE mg/dL    Hgb urine dipstick NEGATIVE  NEGATIVE     Bilirubin Urine NEGATIVE  NEGATIVE    Ketones, ur NEGATIVE  NEGATIVE mg/dL    Protein, ur NEGATIVE  NEGATIVE mg/dL    Urobilinogen, UA 0.2  0.0 - 1.0 mg/dL    Nitrite NEGATIVE  NEGATIVE    Leukocytes, UA NEGATIVE  NEGATIVE MICROSCOPIC NOT DONE ON URINES WITH NEGATIVE PROTEIN, BLOOD, LEUKOCYTES, NITRITE, OR GLUCOSE <1000 mg/dL.  POCT I-STAT TROPONIN I     Status: Normal   Collection Time   06/10/12  5:53 PM      Component Value Range Comment   Troponin i, poc 0.01  0.00 - 0.08 ng/mL    Comment 3            POCT I-STAT, CHEM 8     Status: Abnormal   Collection Time   06/10/12  5:55 PM      Component Value Range Comment   Sodium 145  135 - 145 mEq/L    Potassium 3.5  3.5 - 5.1 mEq/L    Chloride 112  96 - 112 mEq/L    BUN 30 (*) 6 - 23 mg/dL    Creatinine, Ser 0.45 (*) 0.50 - 1.10 mg/dL    Glucose, Bld 409 (*) 70 - 99 mg/dL    Calcium, Ion 8.11 (*) 1.13 - 1.30 mmol/L    TCO2 21  0 - 100 mmol/L    Hemoglobin 15.3 (*) 12.0 - 15.0 g/dL    HCT 91.4  78.2 - 95.6 %   LACTIC ACID, PLASMA     Status: Abnormal   Collection Time   06/10/12  6:10 PM      Component Value Range  Comment   Lactic Acid, Venous 4.0 (*) 0.5 - 2.2 mmol/L    Dg Chest Port 1v Same Day  06/10/2012  *RADIOLOGY REPORT*  Clinical Data: Hypertension  PORTABLE CHEST - 1 VIEW SAME DAY  Comparison: 03/28/2012  Findings: Normal heart size and vascularity.  Low lung volumes accentuate the basilar vascular markings.  Negative for CHF or pneumonia.  No large effusion or pneumothorax.  Atherosclerosis of the aorta.  IMPRESSION: Low volume exam without acute process  Original Report Authenticated By: Judie Petit. Ruel Favors, M.D.   Ct Angio Chest Aortic Dissect W &/or W/o  06/10/2012  *RADIOLOGY REPORT*  Clinical Data:  Abdominal pain, hypertension  CT ANGIOGRAPHY CHEST, ABDOMEN AND PELVIS  Technique:  Multidetector CT imaging through the chest, abdomen and pelvis was performed using the standard protocol during bolus administration of intravenous  contrast.  Multiplanar reconstructed images including MIPs were obtained and reviewed to evaluate the vascular anatomy.  Contrast: 75mL OMNIPAQUE IOHEXOL 350 MG/ML SOLN  Comparison:   None.  CTA CHEST  Findings:  Diffuse atherosclerotic changes of the aorta, most pronounced of the transverse aorta.  Brachiocephalic, left common carotid, left subclavian arteries all remain patent.  Negative for of thoracic dissection or significant aneurysm.  No mediastinal hemorrhage or hematoma.  Normal heart size.  No pericardial or pleural effusion.  Negative for adenopathy.  Lung windows demonstrate hyperinflation with centrilobular emphysema diffusely.  Minimal basilar atelectasis.  Anterior left upper lobe 7 mm nodule noted, image 42.  This remains indeterminate.  Negative for acute pneumonia or airspace process. No interstitial change, collapse or consolidation.  Trachea central airways patent.  Diffuse degenerative changes of the spine with mild scoliosis.   Review of the MIP images confirms the above findings.  IMPRESSION: Negative for thoracic aortic dissection.  Thoracic atherosclerosis evident.  COPD/emphysema  7 mm anterior left upper lobe indeterminate nodule. If the patient is at high risk for bronchogenic carcinoma, follow-up chest CT at 6- 12 months is recommended.  If the patient is at low risk for bronchogenic carcinoma, follow-up chest CT at 12 months is recommended.  This recommendation follows the consensus statement: Guidelines for Management of Small Pulmonary Nodules Detected on CT Scans: A Statement from the Fleischner Society as published in Radiology 2005; 237:395-400.  CTA ABDOMEN AND PELVIS  Findings:  Scattered pneumoperitoneum throughout the upper abdomen. Free air present in the left upper quadrant and over the liver as well.  Locules of free air present anterior to the stomach. Diffuse wall thickening of the stomach with mucosal enhancement and surrounding edema.  Findings could be related to  perforated gastric ulcer.  Also, there is diffuse upper abdominal ascites, mesenteric congestion, and bowel wall thickening involving the transverse colon and several loops of small bowel in the lower abdomen.  No associated pneumatosis.  Appearance is nonspecific but consistent with diffuse enterocolitis which could be ischemic, infectious, or inflammatory.  No associated obstruction pattern, dilatation, or ileus.  Atherosclerotic changes of the aorta.  Aortic lumen opacification is limited but no gross abdominal aortic dissection.  No retroperitoneal hemorrhage.  Heavy calcification of the SMA origin. SMA appears to be patent.  Celiac artery is patent.  Prior cholecystectomy evident.  No biliary dilatation.  No focal hepatic abnormality.  Portal vein is patent.  Biliary system, pancreas, spleen, adrenal glands within normal limits.  Pelvis:  Retained stool throughout entire colon.  Pelvic ascites also noted.  Foley catheter decompresses the bladder. No pelvic hemorrhage, hematoma, adenopathy, inguinal abnormality, or hernia.  Review of the MIP images confirms the above findings.  IMPRESSION: Pneumoperitoneum with wall thickening and edema as well as mucosal enhancement the stomach.  Perforated gastric ulcer could have this appearance versus other perforated bowel.  Wall thickening mucosal enhancement of the distal small bowel and portions of the colon compatible with enterocolitis without obstruction or pneumatosis.  Diffuse mesenteric congestion with abdominal and pelvic ascites  Critical Value/emergent results were called by telephone at the time of interpretation on 06/10/2012 at 7:00 p.m. to Dr. Alto Denver, who verbally acknowledged these results.  Original Report Authenticated By: Judie Petit. Ruel Favors, M.D.   Ct Angio Abd/pel W/ And/or W/o  06/10/2012  *RADIOLOGY REPORT*  Clinical Data:  Abdominal pain, hypertension  CT ANGIOGRAPHY CHEST, ABDOMEN AND PELVIS  Technique:  Multidetector CT imaging through the chest,  abdomen and pelvis was performed using the standard protocol during bolus administration of intravenous contrast.  Multiplanar reconstructed images including MIPs were obtained and reviewed to evaluate the vascular anatomy.  Contrast: 75mL OMNIPAQUE IOHEXOL 350 MG/ML SOLN  Comparison:   None.  CTA CHEST  Findings:  Diffuse atherosclerotic changes of the aorta, most pronounced of the transverse aorta.  Brachiocephalic, left common carotid, left subclavian arteries all remain patent.  Negative for of thoracic dissection or significant aneurysm.  No mediastinal hemorrhage or hematoma.  Normal heart size.  No pericardial or pleural effusion.  Negative for adenopathy.  Lung windows demonstrate hyperinflation with centrilobular emphysema diffusely.  Minimal basilar atelectasis.  Anterior left upper lobe 7 mm nodule noted, image 42.  This remains indeterminate.  Negative for acute pneumonia or airspace process. No interstitial change, collapse or consolidation.  Trachea central airways patent.  Diffuse degenerative changes of the spine with mild scoliosis.   Review of the MIP images confirms the above findings.  IMPRESSION: Negative for thoracic aortic dissection.  Thoracic atherosclerosis evident.  COPD/emphysema  7 mm anterior left upper lobe indeterminate nodule. If the patient is at high risk for bronchogenic carcinoma, follow-up chest CT at 6- 12 months is recommended.  If the patient is at low risk for bronchogenic carcinoma, follow-up chest CT at 12 months is recommended.  This recommendation follows the consensus statement: Guidelines for Management of Small Pulmonary Nodules Detected on CT Scans: A Statement from the Fleischner Society as published in Radiology 2005; 237:395-400.  CTA ABDOMEN AND PELVIS  Findings:  Scattered pneumoperitoneum throughout the upper abdomen. Free air present in the left upper quadrant and over the liver as well.  Locules of free air present anterior to the stomach. Diffuse wall  thickening of the stomach with mucosal enhancement and surrounding edema.  Findings could be related to perforated gastric ulcer.  Also, there is diffuse upper abdominal ascites, mesenteric congestion, and bowel wall thickening involving the transverse colon and several loops of small bowel in the lower abdomen.  No associated pneumatosis.  Appearance is nonspecific but consistent with diffuse enterocolitis which could be ischemic, infectious, or inflammatory.  No associated obstruction pattern, dilatation, or ileus.  Atherosclerotic changes of the aorta.  Aortic lumen opacification is limited but no gross abdominal aortic dissection.  No retroperitoneal hemorrhage.  Heavy calcification of the SMA origin. SMA appears to be patent.  Celiac artery is patent.  Prior cholecystectomy evident.  No biliary dilatation.  No focal hepatic abnormality.  Portal vein is patent.  Biliary system, pancreas, spleen, adrenal glands within normal limits.  Pelvis:  Retained stool throughout entire colon.  Pelvic ascites also noted.  Foley catheter decompresses the bladder.  No pelvic hemorrhage, hematoma, adenopathy, inguinal abnormality, or hernia.   Review of the MIP images confirms the above findings.  IMPRESSION: Pneumoperitoneum with wall thickening and edema as well as mucosal enhancement the stomach.  Perforated gastric ulcer could have this appearance versus other perforated bowel.  Wall thickening mucosal enhancement of the distal small bowel and portions of the colon compatible with enterocolitis without obstruction or pneumatosis.  Diffuse mesenteric congestion with abdominal and pelvic ascites  Critical Value/emergent results were called by telephone at the time of interpretation on 06/10/2012 at 7:00 p.m. to Dr. Alto Denver, who verbally acknowledged these results.  Original Report Authenticated By: Judie Petit. Ruel Favors, M.D.    Review of Systems  Constitutional: Positive for chills and diaphoresis. Negative for fever.  HENT:  Negative.   Respiratory: Positive for cough.   Cardiovascular: Negative.   Gastrointestinal: Positive for nausea and abdominal pain. Negative for heartburn, vomiting, diarrhea, constipation and blood in stool.  Genitourinary: Positive for flank pain. Negative for hematuria.  Neurological: Negative for seizures.  Endo/Heme/Allergies: Does not bruise/bleed easily.    Blood pressure 93/35, pulse 77, temperature 95.3 F (35.2 C), temperature source Core (Comment), resp. rate 32, SpO2 97.00%. Physical Exam  Constitutional:       Elderly female in NAD.  HENT:  Head: Normocephalic and atraumatic.  Eyes: EOM are normal. No scleral icterus.  Neck: Neck supple.  Cardiovascular: Normal rate and regular rhythm.   Respiratory:       Distant breath sounds that are clear.  GI: She exhibits distension. There is tenderness. There is guarding.       Firm, distended and diffusely tender to palpation and percussion.  RUQ scar.  Genitourinary:       Foley in.  Musculoskeletal: She exhibits no edema.  Lymphadenopathy:    She has no cervical adenopathy.  Neurological: She is alert.  Skin: Skin is warm and dry. No rash noted.     Assessment/Plan Acute abdomen secondary to gastrointestinal perforation. Differential diagnosis includes perforated ulcer, perforated diverticulitis, small intestinal perforation, perforated appendix. She currently has peritonitis on exam.  Plan: Emergency exploratory laparotomy with repair of gastrointestinal perforation, possible intestinal resection and colostomy.  I have explained the procedure and risks of emergency abdominal surgery.  Risks include but are not limited to bleeding, infection, wound problems, anesthesia, anastomotic or repair leak, need for colostomy, injury to intraabominal organs (such as intestine, spleen, kidney, bladder, ureter, etc.), need for more operations, and death.  She and her son seem to understand and agree to proceed.  Mita Vallo  J 06/10/2012, 8:13 PM

## 2012-06-10 NOTE — ED Notes (Signed)
rn notified 

## 2012-06-10 NOTE — ED Notes (Signed)
Phlebotomy at bedside.

## 2012-06-10 NOTE — ED Notes (Signed)
Pt c/o severe abdominal pain, Dr. Alto Denver aware

## 2012-06-10 NOTE — Op Note (Signed)
Operative Note  CAMIELLE SIZER female 77 y.o. 06/10/2012  PREOPERATIVE DX:    Gastrointestinal perforation  POSTOPERATIVE DX:  Perforated prepyloric gastric ulcer  PROCEDURE:  Exploratory laparotomy, primary repair of perforated anterior gastric prepyloric ulcer and omental patching         Surgeon: Adolph Pollack   Assistants: WAKEFIELD, MATTHEW  Anesthesia: General endotracheal anesthesia  Indications:   tHIS IS A 4-YEAR-OLD FEMALE WITH A TWO-WEEK HISTORY OF abdominal pain that became acutely worse. She presented to the emergency department with hypotension and peritonitis. A CT scan was consistent with pneumoperitoneum and a lot of free fluid. She is now brought to the operating room for emergency exploratory laparotomy.    Procedure Detail:   She was brought to the operating room placed supine on the operating table and general anesthetic was administered. The abdominal wall was widely sterilely prepped and draped. A midline incision was made beginning in the midepigastrium extending down below the umbilicus. The fascia and peritoneum were incised and the peritoneal cavity was entered. A significant amount of thin bilious fluid was encountered and evacuated. Inspection of the stomach demonstrated a anterior ulceration in the immediate prepyloric area. There is no evidence of colonic perforation or small bowel perforation.  I primarily closed the perforation with interrupted 2-0 silk sutures that were left long. I then placed piece of omentum over the primary closure and used to primary sutures to anchor the omentum over the closure. I used interrupted silk sutures to further anchor the omentum in a circumferential fashion.  This provided for good closure and good patching with adequate overlap.  I then copiously irrigated out the abdominal cavity with warm saline solution and evacuated. I made stab incision in the right lower quadrant a 19 Blake drain was then placed into the  abdominal cavity.  The drain was positioned lying underneath the left lobe of the liver near the area of primary repair. The drain was anchored to the skin with 3-0 nylon suture.  Following this I then closed the fascia with a running #1 PDS suture and interrupted #1 Novafil sutures. Needle sponge and a straight Kocher reportedly correct. The subcutaneous tissue was left open and packed with saline moistened gauze followed by dry dressing. The drain was hooked up to bulb suction.  She tolerated the procedure fairly well without any apparent complications and was taken to the recovery room in satisfactory condition.    Findings: Perforated prepyloric gastric ulcer  Estimated Blood Loss:  200 mL         Drains: JACKSON-PRATT (JP)          Blood Given: none          Specimens: none        Complications:  * No complications entered in OR log *         Disposition: PACU - hemodynamically stable.         Condition: stable

## 2012-06-10 NOTE — ED Notes (Signed)
Per son, pt has not been feeling well x 1 week, felt better but then decided to come in today to the ED. C/o abd pain at LLQ, tender to palpate. BP 78/39 at Triage. Pt appeared pale, cool to touch, rectal temp 95.1,

## 2012-06-10 NOTE — ED Notes (Signed)
Patient transported to OR by OR tech. Pt POA accompanying patient and OR tech.

## 2012-06-10 NOTE — Transfer of Care (Signed)
Immediate Anesthesia Transfer of Care Note  Patient: April [name]  Procedure(s) Performed: Procedure(s) (LRB): EXPLORATORY LAPAROTOMY (N/A)  Patient Location: PACU  Anesthesia Type: General  Level of Consciousness: awake  Airway & Oxygen Therapy: Patient Spontanous Breathing and Patient connected to face mask oxygen  Post-op Assessment: Report given to PACU RN and Post -op Vital signs reviewed and stable  Post vital signs: Reviewed and stable  Complications: No apparent anesthesia complications

## 2012-06-10 NOTE — ED Notes (Signed)
RNs in room

## 2012-06-10 NOTE — Anesthesia Preprocedure Evaluation (Addendum)
Anesthesia Evaluation  Patient identified by MRN, date of birth, ID band Patient awake    Reviewed: Allergy & Precautions, H&P , NPO status , Patient's Chart, lab work & pertinent test results  Airway Mallampati: II TM Distance: >3 FB Neck ROM: Full    Dental No notable dental hx. (+) Edentulous Upper and Edentulous Lower   Pulmonary COPDCurrent Smoker,  COPD on chest CT scan. CXR with low volumes. breath sounds clear to auscultation  Pulmonary exam normal       Cardiovascular hypertension, Pt. on medications negative cardio ROS  Rhythm:Regular Rate:Normal  ECG: nsr, with diffuse minimal ST depression.   Neuro/Psych negative neurological ROS  negative psych ROS   GI/Hepatic negative GI ROS, Neg liver ROS,   Endo/Other  Type 2, Oral Hypoglycemic AgentsHyperthyroidism Hyperparathyroidism. Hypercalcemia.  Renal/GU Creatinine elevated.  negative genitourinary   Musculoskeletal negative musculoskeletal ROS (+)   Abdominal   Peds negative pediatric ROS (+)  Hematology negative hematology ROS (+)   Anesthesia Other Findings   Reproductive/Obstetrics negative OB ROS                         Anesthesia Physical Anesthesia Plan  ASA: III and Emergent  Anesthesia Plan: General   Post-op Pain Management:    Induction: Intravenous  Airway Management Planned: Oral ETT  Additional Equipment:   Intra-op Plan:   Post-operative Plan: Extubation in OR  Informed Consent: I have reviewed the patients History and Physical, chart, labs and discussed the procedure including the risks, benefits and alternatives for the proposed anesthesia with the patient or authorized representative who has indicated his/her understanding and acceptance.   Dental advisory given  Plan Discussed with: CRNA  Anesthesia Plan Comments:         Anesthesia Quick Evaluation

## 2012-06-10 NOTE — ED Notes (Signed)
Pt transported to CT scan by RN on cardiac monitor.

## 2012-06-10 NOTE — ED Notes (Signed)
Dr. Alto Denver at bedside. Xray at bedside

## 2012-06-10 NOTE — ED Provider Notes (Addendum)
History     CSN: [id number]  Arrival date & time 06/10/12  1530   First MD Initiated Contact with Patient 06/10/12 1734      No chief complaint on file.   (Consider location/radiation/quality/duration/timing/severity/associated sxs/prior treatment) HPI Patient is a 77 year old diabetic female with history of hypertension who presents today with hypotension as well as hypothermia and severe 10 out of 10 diffuse abdominal pain. Patient reports that she's had the symptoms on and off for 2 weeks. Patient has surgical history remarkable for cholecystectomy. She is alert and oriented x3. Patient is unable to get comfortable and is shifting around this and the bed. She denies any urinary symptoms as well as chest pain, shortness of breath, or fevers. She does have history of ulcer disease by her report her son denies this. Patient denies any blood in her stools. She is a full code though her son makes her medical decisions. She denies any cough, nausea, or vomiting. Patient's pain radiates into the left flank. There no other associated or modifying factors.  Past Medical History  Diagnosis Date  . Diabetes mellitus   . Hyperthyroidism   . Hypertension     Past Surgical History  Procedure Date  . 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  History  Substance Use Topics  . Smoking status: Never Smoker   . Smokeless tobacco: Not on file  . Alcohol Use: No    OB History    Grav Para Term Preterm Abortions TAB SAB Ect Mult Living                  Review of Systems  Constitutional: Negative.   HENT: Negative.   Eyes: Negative.   Respiratory: Negative.   Cardiovascular: Negative.   Gastrointestinal: Positive for abdominal pain.  Genitourinary: Positive for flank pain.  Musculoskeletal: Negative.   Skin: Negative.   Neurological: Negative.   Hematological: Negative.   Psychiatric/Behavioral: Negative.   All other systems reviewed and are  negative.    Allergies  Review of patient's allergies indicates no known allergies.  Home Medications   Current Outpatient Rx  Name Route Sig Dispense Refill  . ASPIRIN 81 MG PO TABS Oral Take 81 mg by mouth daily.      . ATORVASTATIN CALCIUM 20 MG PO TABS Oral Take 20 mg by mouth daily.      Marland Kitchen SITAGLIPTIN-METFORMIN HCL 50-500 MG PO TABS Oral Take 1 tablet by mouth 2 (two) times daily with a meal.        BP 93/35  Pulse 77  Temp 95.3 F (35.2 C) (Core (Comment))  Resp 32  SpO2 97%  Physical Exam  Nursing note and vitals reviewed. GEN: Well-developed, well-nourished female in moderate distress HEENT: Atraumatic, normocephalic. Oropharynx clear without erythema EYES: PERRLA BL, no scleral icterus. NECK: Trachea midline, no meningismus CV: regular rate and rhythm. No murmurs, rubs, or gallops PULM: No respiratory distress.  No crackles, wheezes, or rales. GI: soft, diffuse severe tenderness to palpation. Positive guarding, positive rebound. Patient with right upper quadrant open cholecystectomy scar. GU: deferred Neuro: cranial nerves grossly 2-12 intact, no abnormalities of strength or sensation, A and O x 3 MSK: Patient moves all 4 extremities symmetrically, no deformity, edema, or injury noted Skin: No rashes petechiae, purpura, or jaundice   ED Course  Procedures (including critical care time)  Indication: hypotension Please note this EKG was reviewed extemporaneously by myself.   Date:  06/10/2012 #1  Rate: 69  Rhythm: normal sinus rhythm  QRS Axis: normal  Intervals: normal  ST/T Wave abnormalities: ST depressions inferiorly and ST depressions laterally  Conduction Disutrbances:none  Narrative Interpretation: ST depression to 1 mm noted in leads 2,3, aVF and leads V4 through V6. This is a change compared to prior EKG   Old EKG Reviewed: changes noted  Indication: pre-op, no longer hypotensive Please note this EKG was reviewed extemporaneously by myself.    Date: 06/10/2012 #2  Rate: 81  Rhythm: normal sinus rhythm  QRS Axis: normal  Intervals: normal  ST/T Wave abnormalities: ST elevations inferiorly and ST elevations laterally  Conduction Disutrbances:none  Narrative Interpretation: ST depressions improving with decrease in magnitude now less than 1 mm in majority of previously noted leads.  Old EKG Reviewed: changes noted  CRITICAL CARE Performed by: Cyndra Numbers   Total critical care time: 45 minutes  Critical care time was exclusive of separately billable procedures and treating other patients.  Critical care was necessary to treat or prevent imminent or life-threatening deterioration.  Critical care was time spent personally by me on the following activities: development of treatment plan with patient and/or surrogate as well as nursing, discussions with consultants, evaluation of patient's response to treatment, examination of patient, obtaining history from patient or surrogate, ordering and performing treatments and interventions, ordering and review of laboratory studies, ordering and review of radiographic studies, pulse oximetry and re-evaluation of patient's condition.        Labs Reviewed  GLUCOSE, CAPILLARY - Abnormal; Notable for the following:    Glucose-Capillary 189 (*)     All other components within normal limits  CBC WITH DIFFERENTIAL - Abnormal; Notable for the following:    Hemoglobin 15.1 (*)     All other components within normal limits  COMPREHENSIVE METABOLIC PANEL - Abnormal; Notable for the following:    Potassium 3.4 (*)     Glucose, Bld 279 (*)     BUN 27 (*)     Creatinine, Ser 1.95 (*)     Calcium 11.9 (*)     Albumin 3.4 (*)     GFR calc non Af Amer 24 (*)     GFR calc Af Amer 28 (*)     All other components within normal limits  LACTIC ACID, PLASMA - Abnormal; Notable for the following:    Lactic Acid, Venous 4.0 (*)     All other components within normal limits  URINALYSIS, ROUTINE W  REFLEX MICROSCOPIC - Abnormal; Notable for the following:    APPearance CLOUDY (*)     All other components within normal limits  POCT I-STAT, CHEM 8 - Abnormal; Notable for the following:    BUN 30 (*)     Creatinine, Ser 1.70 (*)     Glucose, Bld 265 (*)     Calcium, Ion 1.56 (*)     Hemoglobin 15.3 (*)     All other components within normal limits  PROTIME-INR  POCT I-STAT TROPONIN I  CULTURE, BLOOD (ROUTINE X 2)  CULTURE, BLOOD (ROUTINE X 2)  URINE CULTURE   Dg Chest Port 1v Same Day  06/10/2012  *RADIOLOGY REPORT*  Clinical Data: Hypertension  PORTABLE CHEST - 1 VIEW SAME DAY  Comparison: 03/28/2012  Findings: Normal heart size and vascularity.  Low lung volumes accentuate the basilar vascular markings.  Negative for CHF or pneumonia.  No large effusion or pneumothorax.  Atherosclerosis of the aorta.  IMPRESSION: Low volume exam without acute  process  Original Report Authenticated By: Judie Petit. Ruel Favors, M.D.   Ct Angio Chest Aortic Dissect W &/or W/o  06/10/2012  *RADIOLOGY REPORT*  Clinical Data:  Abdominal pain, hypertension  CT ANGIOGRAPHY CHEST, ABDOMEN AND PELVIS  Technique:  Multidetector CT imaging through the chest, abdomen and pelvis was performed using the standard protocol during bolus administration of intravenous contrast.  Multiplanar reconstructed images including MIPs were obtained and reviewed to evaluate the vascular anatomy.  Contrast: 75mL OMNIPAQUE IOHEXOL 350 MG/ML SOLN  Comparison:   None.  CTA CHEST  Findings:  Diffuse atherosclerotic changes of the aorta, most pronounced of the transverse aorta.  Brachiocephalic, left common carotid, left subclavian arteries all remain patent.  Negative for of thoracic dissection or significant aneurysm.  No mediastinal hemorrhage or hematoma.  Normal heart size.  No pericardial or pleural effusion.  Negative for adenopathy.  Lung windows demonstrate hyperinflation with centrilobular emphysema diffusely.  Minimal basilar atelectasis.   Anterior left upper lobe 7 mm nodule noted, image 42.  This remains indeterminate.  Negative for acute pneumonia or airspace process. No interstitial change, collapse or consolidation.  Trachea central airways patent.  Diffuse degenerative changes of the spine with mild scoliosis.   Review of the MIP images confirms the above findings.  IMPRESSION: Negative for thoracic aortic dissection.  Thoracic atherosclerosis evident.  COPD/emphysema  7 mm anterior left upper lobe indeterminate nodule. If the patient is at high risk for bronchogenic carcinoma, follow-up chest CT at 6- 12 months is recommended.  If the patient is at low risk for bronchogenic carcinoma, follow-up chest CT at 12 months is recommended.  This recommendation follows the consensus statement: Guidelines for Management of Small Pulmonary Nodules Detected on CT Scans: A Statement from the Fleischner Society as published in Radiology 2005; 237:395-400.  CTA ABDOMEN AND PELVIS  Findings:  Scattered pneumoperitoneum throughout the upper abdomen. Free air present in the left upper quadrant and over the liver as well.  Locules of free air present anterior to the stomach. Diffuse wall thickening of the stomach with mucosal enhancement and surrounding edema.  Findings could be related to perforated gastric ulcer.  Also, there is diffuse upper abdominal ascites, mesenteric congestion, and bowel wall thickening involving the transverse colon and several loops of small bowel in the lower abdomen.  No associated pneumatosis.  Appearance is nonspecific but consistent with diffuse enterocolitis which could be ischemic, infectious, or inflammatory.  No associated obstruction pattern, dilatation, or ileus.  Atherosclerotic changes of the aorta.  Aortic lumen opacification is limited but no gross abdominal aortic dissection.  No retroperitoneal hemorrhage.  Heavy calcification of the SMA origin. SMA appears to be patent.  Celiac artery is patent.  Prior cholecystectomy  evident.  No biliary dilatation.  No focal hepatic abnormality.  Portal vein is patent.  Biliary system, pancreas, spleen, adrenal glands within normal limits.  Pelvis:  Retained stool throughout entire colon.  Pelvic ascites also noted.  Foley catheter decompresses the bladder. No pelvic hemorrhage, hematoma, adenopathy, inguinal abnormality, or hernia.   Review of the MIP images confirms the above findings.  IMPRESSION: Pneumoperitoneum with wall thickening and edema as well as mucosal enhancement the stomach.  Perforated gastric ulcer could have this appearance versus other perforated bowel.  Wall thickening mucosal enhancement of the distal small bowel and portions of the colon compatible with enterocolitis without obstruction or pneumatosis.  Diffuse mesenteric congestion with abdominal and pelvic ascites  Critical Value/emergent results were called by telephone at the time of  interpretation on 06/10/2012 at 7:00 p.m. to Dr. Alto Denver, who verbally acknowledged these results.  Original Report Authenticated By: Judie Petit. Ruel Favors, M.D.   Ct Angio Abd/pel W/ And/or W/o  06/10/2012  *RADIOLOGY REPORT*  Clinical Data:  Abdominal pain, hypertension  CT ANGIOGRAPHY CHEST, ABDOMEN AND PELVIS  Technique:  Multidetector CT imaging through the chest, abdomen and pelvis was performed using the standard protocol during bolus administration of intravenous contrast.  Multiplanar reconstructed images including MIPs were obtained and reviewed to evaluate the vascular anatomy.  Contrast: 75mL OMNIPAQUE IOHEXOL 350 MG/ML SOLN  Comparison:   None.  CTA CHEST  Findings:  Diffuse atherosclerotic changes of the aorta, most pronounced of the transverse aorta.  Brachiocephalic, left common carotid, left subclavian arteries all remain patent.  Negative for of thoracic dissection or significant aneurysm.  No mediastinal hemorrhage or hematoma.  Normal heart size.  No pericardial or pleural effusion.  Negative for adenopathy.  Lung windows  demonstrate hyperinflation with centrilobular emphysema diffusely.  Minimal basilar atelectasis.  Anterior left upper lobe 7 mm nodule noted, image 42.  This remains indeterminate.  Negative for acute pneumonia or airspace process. No interstitial change, collapse or consolidation.  Trachea central airways patent.  Diffuse degenerative changes of the spine with mild scoliosis.   Review of the MIP images confirms the above findings.  IMPRESSION: Negative for thoracic aortic dissection.  Thoracic atherosclerosis evident.  COPD/emphysema  7 mm anterior left upper lobe indeterminate nodule. If the patient is at high risk for bronchogenic carcinoma, follow-up chest CT at 6- 12 months is recommended.  If the patient is at low risk for bronchogenic carcinoma, follow-up chest CT at 12 months is recommended.  This recommendation follows the consensus statement: Guidelines for Management of Small Pulmonary Nodules Detected on CT Scans: A Statement from the Fleischner Society as published in Radiology 2005; 237:395-400.  CTA ABDOMEN AND PELVIS  Findings:  Scattered pneumoperitoneum throughout the upper abdomen. Free air present in the left upper quadrant and over the liver as well.  Locules of free air present anterior to the stomach. Diffuse wall thickening of the stomach with mucosal enhancement and surrounding edema.  Findings could be related to perforated gastric ulcer.  Also, there is diffuse upper abdominal ascites, mesenteric congestion, and bowel wall thickening involving the transverse colon and several loops of small bowel in the lower abdomen.  No associated pneumatosis.  Appearance is nonspecific but consistent with diffuse enterocolitis which could be ischemic, infectious, or inflammatory.  No associated obstruction pattern, dilatation, or ileus.  Atherosclerotic changes of the aorta.  Aortic lumen opacification is limited but no gross abdominal aortic dissection.  No retroperitoneal hemorrhage.  Heavy  calcification of the SMA origin. SMA appears to be patent.  Celiac artery is patent.  Prior cholecystectomy evident.  No biliary dilatation.  No focal hepatic abnormality.  Portal vein is patent.  Biliary system, pancreas, spleen, adrenal glands within normal limits.  Pelvis:  Retained stool throughout entire colon.  Pelvic ascites also noted.  Foley catheter decompresses the bladder. No pelvic hemorrhage, hematoma, adenopathy, inguinal abnormality, or hernia.   Review of the MIP images confirms the above findings.  IMPRESSION: Pneumoperitoneum with wall thickening and edema as well as mucosal enhancement the stomach.  Perforated gastric ulcer could have this appearance versus other perforated bowel.  Wall thickening mucosal enhancement of the distal small bowel and portions of the colon compatible with enterocolitis without obstruction or pneumatosis.  Diffuse mesenteric congestion with abdominal and pelvic ascites  Critical Value/emergent results were called by telephone at the time of interpretation on 06/10/2012 at 7:00 p.m. to Dr. Alto Denver, who verbally acknowledged these results.  Original Report Authenticated By: Judie Petit. Ruel Favors, M.D.     1. Pneumoperitoneum   2. Dehydration       MDM  Patient was evaluated by myself. Based on evaluation patient was noted to be hypotensive and was started on IV fluid boluses. Patient was hypothermic and bear hugger was placed. She was alert and oriented with good oxygen saturation as well as normal sinus. Patient complained of diffuse abdominal pain. Laboratory workup for possible sepsis was ordered. Patient was noted to have no leukocytosis. Patient noted pain in abdomen with radiation to the left flank and there was concern for aortic dissection. CT chest abdomen pelvis dissection protocol was performed. Prior to this a chest x-ray had been performed with no widening mediastinum and no free air noted in abdomen. Patient did have elevated creatinine but this was  discussed with radiologist Dr. Purcell Mouton who agreed with plan to complete scan given patient's significant illness. EKG showed ST depressions but troponin was not elevated. Patient no history of coronary artery disease. She denied any chest pain or shortness of breath. CT is revealed pneumoperitoneum. Appearance is consistent with possible perforated gastric ulcer. Patient lactate of 4. IV fluids have been slowed initially following improvement of blood pressure as well as patient having increasing probability of diagnosis of dissection in her differential. These were resumed following CT scan. Patient also was given fentanyl as needed for pain. I discussed the patient with her son who is the health care power of attorney. He was notified of patient's findings. Patient was discussed with general surgery. Dr. Purnell Shoemaker will admit the patient for further management.  He requested that the patient receive a gram of Invanz IV which was ordered. Patient had a repeat EKG performed by myself at this point which showed improvement compared to previous. Patient had had a negative troponin. Patient admitted for further management.        Cyndra Numbers, MD 06/10/12 1950  Cyndra Numbers, MD 06/10/12 2004

## 2012-06-10 NOTE — Preoperative (Signed)
Beta Blockers   Reason not to administer Beta Blockers:Not Applicable 

## 2012-06-10 NOTE — ED Notes (Signed)
Pt on barr hugger

## 2012-06-11 ENCOUNTER — Encounter (HOSPITAL_COMMUNITY): Payer: Self-pay | Admitting: *Deleted

## 2012-06-11 DIAGNOSIS — E860 Dehydration: Secondary | ICD-10-CM

## 2012-06-11 DIAGNOSIS — N189 Chronic kidney disease, unspecified: Secondary | ICD-10-CM | POA: Diagnosis present

## 2012-06-11 DIAGNOSIS — K255 Chronic or unspecified gastric ulcer with perforation: Secondary | ICD-10-CM | POA: Diagnosis present

## 2012-06-11 LAB — GLUCOSE, CAPILLARY
Glucose-Capillary: 108 mg/dL — ABNORMAL HIGH (ref 70–99)
Glucose-Capillary: 97 mg/dL (ref 70–99)

## 2012-06-11 LAB — BASIC METABOLIC PANEL
CO2: 19 mEq/L (ref 19–32)
CO2: 20 mEq/L (ref 19–32)
Calcium: 8.8 mg/dL (ref 8.4–10.5)
Chloride: 115 mEq/L — ABNORMAL HIGH (ref 96–112)
Chloride: 119 mEq/L — ABNORMAL HIGH (ref 96–112)
Creatinine, Ser: 2.01 mg/dL — ABNORMAL HIGH (ref 0.50–1.10)
Glucose, Bld: 97 mg/dL (ref 70–99)
Glucose, Bld: 96 mg/dL (ref 70–99)
Potassium: 3.8 mEq/L (ref 3.5–5.1)
Sodium: 143 mEq/L (ref 135–145)

## 2012-06-11 LAB — URINALYSIS, ROUTINE W REFLEX MICROSCOPIC
Glucose, UA: NEGATIVE mg/dL
Ketones, ur: NEGATIVE mg/dL
Protein, ur: 30 mg/dL — AB
Urobilinogen, UA: 1 mg/dL (ref 0.0–1.0)

## 2012-06-11 LAB — CBC
HCT: 39 % (ref 36.0–46.0)
Hemoglobin: 13.1 g/dL (ref 12.0–15.0)
Hemoglobin: 12.5 g/dL (ref 12.0–15.0)
MCH: 30.6 pg (ref 26.0–34.0)
MCH: 30.5 pg (ref 26.0–34.0)
MCV: 91.1 fL (ref 78.0–100.0)
MCV: 89.3 fL (ref 78.0–100.0)
RBC: 4.28 MIL/uL (ref 3.87–5.11)
RBC: 4.1 MIL/uL (ref 3.87–5.11)
WBC: 7 10*3/uL (ref 4.0–10.5)

## 2012-06-11 LAB — URINE MICROSCOPIC-ADD ON

## 2012-06-11 LAB — SODIUM, URINE, RANDOM: Sodium, Ur: 62 mEq/L

## 2012-06-11 LAB — CREATININE, URINE, RANDOM: Creatinine, Urine: 60.61 mg/dL

## 2012-06-11 MED ORDER — PIPERACILLIN-TAZOBACTAM 3.375 G IVPB
3.3750 g | Freq: Three times a day (TID) | INTRAVENOUS | Status: DC
  Administered 2012-06-11 (×2): 3.375 g via INTRAVENOUS
  Filled 2012-06-11 (×3): qty 50

## 2012-06-11 MED ORDER — PIPERACILLIN-TAZOBACTAM 3.375 G IVPB
3.3750 g | Freq: Three times a day (TID) | INTRAVENOUS | Status: DC
  Administered 2012-06-11 – 2012-06-22 (×33): 3.375 g via INTRAVENOUS
  Filled 2012-06-11 (×33): qty 50

## 2012-06-11 MED ORDER — BIOTENE DRY MOUTH MT LIQD
15.0000 mL | Freq: Two times a day (BID) | OROMUCOSAL | Status: DC
  Administered 2012-06-11 – 2012-06-12 (×4): 15 mL via OROMUCOSAL

## 2012-06-11 MED ORDER — ONDANSETRON HCL 4 MG/2ML IJ SOLN: 4.0000 mg | Freq: Four times a day (QID) | INTRAMUSCULAR | Status: DC | PRN

## 2012-06-11 MED ORDER — SODIUM CHLORIDE 0.9 % IV BOLUS (SEPSIS)
500.0000 mL | Freq: Once | INTRAVENOUS | Status: AC
  Administered 2012-06-11: 500 mL via INTRAVENOUS

## 2012-06-11 MED ORDER — HEPARIN SODIUM (PORCINE) 5000 UNIT/ML IJ SOLN
5000.0000 [IU] | Freq: Three times a day (TID) | INTRAMUSCULAR | Status: DC
  Administered 2012-06-11 – 2012-06-24 (×39): 5000 [IU] via SUBCUTANEOUS
  Filled 2012-06-11 (×42): qty 1

## 2012-06-11 MED ORDER — KCL IN DEXTROSE-NACL 20-5-0.9 MEQ/L-%-% IV SOLN
INTRAVENOUS | Status: DC
  Administered 2012-06-11: 02:00:00 via INTRAVENOUS
  Filled 2012-06-11 (×3): qty 1000

## 2012-06-11 MED ORDER — ACETAMINOPHEN 10 MG/ML IV SOLN
1000.0000 mg | Freq: Four times a day (QID) | INTRAVENOUS | Status: AC
  Administered 2012-06-11 (×4): 1000 mg via INTRAVENOUS
  Filled 2012-06-11 (×4): qty 100

## 2012-06-11 MED ORDER — DEXTROSE-NACL 5-0.9 % IV SOLN
INTRAVENOUS | Status: DC
  Administered 2012-06-11: 14:00:00 via INTRAVENOUS

## 2012-06-11 MED ORDER — PANTOPRAZOLE SODIUM 40 MG IV SOLR
40.0000 mg | Freq: Two times a day (BID) | INTRAVENOUS | Status: DC
  Administered 2012-06-11 – 2012-06-22 (×24): 40 mg via INTRAVENOUS
  Filled 2012-06-11 (×31): qty 40

## 2012-06-11 MED ORDER — HYDROMORPHONE HCL PF 1 MG/ML IJ SOLN
INTRAMUSCULAR | Status: AC
  Administered 2012-06-11: 01:00:00
  Filled 2012-06-11: qty 1

## 2012-06-11 MED ORDER — SODIUM CHLORIDE 0.9 % IV SOLN
INTRAVENOUS | Status: AC
  Administered 2012-06-11: 15:00:00 via INTRAVENOUS

## 2012-06-11 MED ORDER — DEXTROSE-NACL 5-0.9 % IV SOLN
INTRAVENOUS | Status: AC
  Administered 2012-06-11: 15:00:00 via INTRAVENOUS
  Administered 2012-06-12: 1000 mL via INTRAVENOUS

## 2012-06-11 MED ORDER — FENTANYL CITRATE 0.05 MG/ML IJ SOLN
12.5000 ug | INTRAMUSCULAR | Status: DC | PRN
  Administered 2012-06-11: 12.5 ug via INTRAVENOUS
  Administered 2012-06-11 – 2012-06-14 (×8): 25 ug via INTRAVENOUS
  Filled 2012-06-11 (×9): qty 2

## 2012-06-11 NOTE — Progress Notes (Signed)
Report to brooke rn stepdown

## 2012-06-11 NOTE — Progress Notes (Addendum)
General surgery attending note:  Patient interviewed and examined. I agree with the evaluation and treatment plan outlined by Mr. Marlyne Beards.  The patient is basically stable although probably somewhat volume depleted. Oliguric. Creatinine up a bit. We will get IV fluid boluses. There is no evidence of bleeding.  The patient has acute on chronic renal failure.  She does have peritonitis. She did not have a perforated appendix.  We will get [facility] hospitalist involved to help manage her medical problems.  Await H. Pylori titer.   Angelia Mould. Derrell Lolling, M.D., [facility] General and Minimally invasive Surgery Breast and Colorectal Surgery Office:   [phone] Pager:   [phone]

## 2012-06-11 NOTE — Progress Notes (Addendum)
1 Day Post-Op  Subjective: Awake, a little confused. Knows she's in the hospital. Says she hurts, but not as much as last night.  Objective: Vital signs in last 24 hours: Temp:  [92.4 F (33.6 C)-98 F (36.7 C)] 97.3 F (36.3 C) (08/06 0400) Pulse Rate:  [61-89] 80  (08/06 0030) Resp:  [16-32] 27  (08/06 0030) BP: (61-134)/(35-71) 97/49 mmHg (08/06 0541) SpO2:  [90 %-100 %] 97 % (08/06 0227) Weight:  [62.6 kg (138 lb 0.1 oz)] 62.6 kg (138 lb 0.1 oz) (08/06 0544) Last BM Date: 06/10/12  230 urine ouput, noted up to 0700, about 100 ml in bag now.  I/O= 3439/690  360 ML thru the drain blood loss BP 91/47 HR 88, creatinine up , WBC is low H/H is stable.  Intake/Output from previous day: 08/05 0701 - 08/06 0700 In: 3439.6 [I.V.:2889.6; IV Piggyback:550] Out: 690 [Urine:230; Drains:360; Blood:100] Intake/Output this shift:    General appearance: alert, cooperative, mild distress and mildly confused. Resp: clear to auscultation bilaterally GI: Lg dressing on abdomen, no bowel sounds, dark brown-black drainage from NG  Serous drainage from abdominal JP drain.  Lab Results:   Basename 06/11/12 0323 06/10/12 1755 06/10/12 1745  WBC 3.3* -- 8.4  HGB 13.1 15.3* --  HCT 39.0 45.0 --  PLT 188 -- 275    BMET  Basename 06/11/12 0323 06/10/12 1755 06/10/12 1745  NA 143 145 --  K 3.8 3.5 --  CL 115* 112 --  CO2 19 -- 22  GLUCOSE 97 265* --  BUN 28* 30* --  CREATININE 1.91* 1.70* --  CALCIUM 9.1 -- 11.9*   PT/INR  Basename 06/10/12 1745  LABPROT 12.7  INR 0.93     Lab 06/10/12 1745  AST 13  ALT 9  ALKPHOS 91  BILITOT 0.3  PROT 6.4  ALBUMIN 3.4*     Lipase  No results found for this basename: lipase     Studies/Results: Dg Chest Port 1v Same Day  06/10/2012  *RADIOLOGY REPORT*  Clinical Data: Hypertension  PORTABLE CHEST - 1 VIEW SAME DAY  Comparison: 03/28/2012  Findings: Normal heart size and vascularity.  Low lung volumes accentuate the basilar vascular  markings.  Negative for CHF or pneumonia.  No large effusion or pneumothorax.  Atherosclerosis of the aorta.  IMPRESSION: Low volume exam without acute process  Original Report Authenticated By: Judie Petit. Ruel Favors, M.D.   Ct Angio Chest Aortic Dissect W &/or W/o  06/10/2012  *RADIOLOGY REPORT*  Clinical Data:  Abdominal pain, hypertension  CT ANGIOGRAPHY CHEST, ABDOMEN AND PELVIS  Technique:  Multidetector CT imaging through the chest, abdomen and pelvis was performed using the standard protocol during bolus administration of intravenous contrast.  Multiplanar reconstructed images including MIPs were obtained and reviewed to evaluate the vascular anatomy.  Contrast: 75mL OMNIPAQUE IOHEXOL 350 MG/ML SOLN  Comparison:   None.  CTA CHEST  Findings:  Diffuse atherosclerotic changes of the aorta, most pronounced of the transverse aorta.  Brachiocephalic, left common carotid, left subclavian arteries all remain patent.  Negative for of thoracic dissection or significant aneurysm.  No mediastinal hemorrhage or hematoma.  Normal heart size.  No pericardial or pleural effusion.  Negative for adenopathy.  Lung windows demonstrate hyperinflation with centrilobular emphysema diffusely.  Minimal basilar atelectasis.  Anterior left upper lobe 7 mm nodule noted, image 42.  This remains indeterminate.  Negative for acute pneumonia or airspace process. No interstitial change, collapse or consolidation.  Trachea central airways patent.  Diffuse degenerative changes of the spine with mild scoliosis.   Review of the MIP images confirms the above findings.  IMPRESSION: Negative for thoracic aortic dissection.  Thoracic atherosclerosis evident.  COPD/emphysema  7 mm anterior left upper lobe indeterminate nodule. If the patient is at high risk for bronchogenic carcinoma, follow-up chest CT at 6- 12 months is recommended.  If the patient is at low risk for bronchogenic carcinoma, follow-up chest CT at 12 months is recommended.  This  recommendation follows the consensus statement: Guidelines for Management of Small Pulmonary Nodules Detected on CT Scans: A Statement from the Fleischner Society as published in Radiology 2005; 237:395-400.  CTA ABDOMEN AND PELVIS  Findings:  Scattered pneumoperitoneum throughout the upper abdomen. Free air present in the left upper quadrant and over the liver as well.  Locules of free air present anterior to the stomach. Diffuse wall thickening of the stomach with mucosal enhancement and surrounding edema.  Findings could be related to perforated gastric ulcer.  Also, there is diffuse upper abdominal ascites, mesenteric congestion, and bowel wall thickening involving the transverse colon and several loops of small bowel in the lower abdomen.  No associated pneumatosis.  Appearance is nonspecific but consistent with diffuse enterocolitis which could be ischemic, infectious, or inflammatory.  No associated obstruction pattern, dilatation, or ileus.  Atherosclerotic changes of the aorta.  Aortic lumen opacification is limited but no gross abdominal aortic dissection.  No retroperitoneal hemorrhage.  Heavy calcification of the SMA origin. SMA appears to be patent.  Celiac artery is patent.  Prior cholecystectomy evident.  No biliary dilatation.  No focal hepatic abnormality.  Portal vein is patent.  Biliary system, pancreas, spleen, adrenal glands within normal limits.  Pelvis:  Retained stool throughout entire colon.  Pelvic ascites also noted.  Foley catheter decompresses the bladder. No pelvic hemorrhage, hematoma, adenopathy, inguinal abnormality, or hernia.   Review of the MIP images confirms the above findings.  IMPRESSION: Pneumoperitoneum with wall thickening and edema as well as mucosal enhancement the stomach.  Perforated gastric ulcer could have this appearance versus other perforated bowel.  Wall thickening mucosal enhancement of the distal small bowel and portions of the colon compatible with  enterocolitis without obstruction or pneumatosis.  Diffuse mesenteric congestion with abdominal and pelvic ascites  Critical Value/emergent results were called by telephone at the time of interpretation on 06/10/2012 at 7:00 p.m. to Dr. Alto Denver, who verbally acknowledged these results.  Original Report Authenticated By: Judie Petit. Ruel Favors, M.D.   Ct Angio Abd/pel W/ And/or W/o  06/10/2012  *RADIOLOGY REPORT*  Clinical Data:  Abdominal pain, hypertension  CT ANGIOGRAPHY CHEST, ABDOMEN AND PELVIS  Technique:  Multidetector CT imaging through the chest, abdomen and pelvis was performed using the standard protocol during bolus administration of intravenous contrast.  Multiplanar reconstructed images including MIPs were obtained and reviewed to evaluate the vascular anatomy.  Contrast: 75mL OMNIPAQUE IOHEXOL 350 MG/ML SOLN  Comparison:   None.  CTA CHEST  Findings:  Diffuse atherosclerotic changes of the aorta, most pronounced of the transverse aorta.  Brachiocephalic, left common carotid, left subclavian arteries all remain patent.  Negative for of thoracic dissection or significant aneurysm.  No mediastinal hemorrhage or hematoma.  Normal heart size.  No pericardial or pleural effusion.  Negative for adenopathy.  Lung windows demonstrate hyperinflation with centrilobular emphysema diffusely.  Minimal basilar atelectasis.  Anterior left upper lobe 7 mm nodule noted, image 42.  This remains indeterminate.  Negative for acute pneumonia or airspace process. No  interstitial change, collapse or consolidation.  Trachea central airways patent.  Diffuse degenerative changes of the spine with mild scoliosis.   Review of the MIP images confirms the above findings.  IMPRESSION: Negative for thoracic aortic dissection.  Thoracic atherosclerosis evident.  COPD/emphysema  7 mm anterior left upper lobe indeterminate nodule. If the patient is at high risk for bronchogenic carcinoma, follow-up chest CT at 6- 12 months is recommended.  If  the patient is at low risk for bronchogenic carcinoma, follow-up chest CT at 12 months is recommended.  This recommendation follows the consensus statement: Guidelines for Management of Small Pulmonary Nodules Detected on CT Scans: A Statement from the Fleischner Society as published in Radiology 2005; 237:395-400.  CTA ABDOMEN AND PELVIS  Findings:  Scattered pneumoperitoneum throughout the upper abdomen. Free air present in the left upper quadrant and over the liver as well.  Locules of free air present anterior to the stomach. Diffuse wall thickening of the stomach with mucosal enhancement and surrounding edema.  Findings could be related to perforated gastric ulcer.  Also, there is diffuse upper abdominal ascites, mesenteric congestion, and bowel wall thickening involving the transverse colon and several loops of small bowel in the lower abdomen.  No associated pneumatosis.  Appearance is nonspecific but consistent with diffuse enterocolitis which could be ischemic, infectious, or inflammatory.  No associated obstruction pattern, dilatation, or ileus.  Atherosclerotic changes of the aorta.  Aortic lumen opacification is limited but no gross abdominal aortic dissection.  No retroperitoneal hemorrhage.  Heavy calcification of the SMA origin. SMA appears to be patent.  Celiac artery is patent.  Prior cholecystectomy evident.  No biliary dilatation.  No focal hepatic abnormality.  Portal vein is patent.  Biliary system, pancreas, spleen, adrenal glands within normal limits.  Pelvis:  Retained stool throughout entire colon.  Pelvic ascites also noted.  Foley catheter decompresses the bladder. No pelvic hemorrhage, hematoma, adenopathy, inguinal abnormality, or hernia.   Review of the MIP images confirms the above findings.  IMPRESSION: Pneumoperitoneum with wall thickening and edema as well as mucosal enhancement the stomach.  Perforated gastric ulcer could have this appearance versus other perforated bowel.  Wall  thickening mucosal enhancement of the distal small bowel and portions of the colon compatible with enterocolitis without obstruction or pneumatosis.  Diffuse mesenteric congestion with abdominal and pelvic ascites  Critical Value/emergent results were called by telephone at the time of interpretation on 06/10/2012 at 7:00 p.m. to Dr. Alto Denver, who verbally acknowledged these results.  Original Report Authenticated By: Judie Petit. Ruel Favors, M.D.    Medications:    . sodium chloride  1,000 mL Intravenous Once   Followed by  . sodium chloride  1,000 mL Intravenous Once  . antiseptic oral rinse  15 mL Mouth Rinse BID  . ertapenem  1 g Intravenous Once  . fentaNYL  50 mcg Intravenous Once  . fentaNYL  50 mcg Intravenous Once  . heparin  5,000 Units Subcutaneous Q8H  . HYDROmorphone      . insulin aspart  0-15 Units Subcutaneous Q4H  . pantoprazole (PROTONIX) IV  40 mg Intravenous Q12H  . piperacillin-tazobactam (ZOSYN)  IV  3.375 g Intravenous Q8H  . sodium chloride  500 mL Intravenous Once    Assessment/Plan Perforated prepyloric gastric ulcer s/p Exploratory laparotomy, primary repair of perforated anterior gastric prepyloric ulcer and omental patching ROSENBOWER,TODD J, 06/10/12 Renal Insuffiencey Hypotension - mild  Patient Active Problem List  Diagnosis  . HTN (hypertension)  . Hyperlipidemia  .  Benign tumor of pituitary gland, pituitary macroadenoma (asymptomatic Bradycardia during this hospitalization [facility]) 2006  . Polyp of colon  . Hyperparathyroidism, primary  . DM (diabetes mellitus)  S/p open cholecystectomy Hx of tobacco use  Plan: 500 ml NS bolus, recheck labs at noon, continue NPO, if urine output does not improve or creatinine continues to rise, will get medicine onboard to help. Protonix bid, and IV antibiotics, heparin for DVT prophylaxis. Start dressing changes tomorrow. IV tylenol for pain control    Repeat creatinine up to 2.01, U/o look a bit  better, BP improving.  I will give another bolus of NS.  Take the KCL out of IV and change to D5NS, sugars are still low.  I have ask medicine to see also.  Labs ordered for the AM.  LOS: 1 day    April [name] 06/11/2012

## 2012-06-11 NOTE — Progress Notes (Signed)
CARE MANAGEMENT NOTE 06/11/2012  Patient:  April [name], April [name]   Account Number:  [id number]  Date Initiated:  06/11/2012  Documentation initiated by:  Abhinav Mayorquin  Subjective/Objective Assessment:   perforated gastric ulcer with surgical closure, fld depletion and fld challenge     Action/Plan:   from home   Anticipated DC Date:  06/14/2012   Anticipated DC Plan:  HOME/SELF CARE  In-house referral  NA      DC Planning Services  NA      Behavioral [facility] Hospital Choice  NA   Choice offered to / List presented to:  NA   DME arranged  NA      DME agency  NA     HH arranged  NA      HH agency  NA   Status of service:  In process, will continue to follow Medicare Important Message given?  NA - LOS <3 / Initial given by admissions (If response is "NO", the following Medicare IM given date fields will be blank) Date Medicare IM given:   Date Additional Medicare IM given:    Discharge Disposition:    Per UR Regulation:  Reviewed for med. necessity/level of care/duration of stay  If discussed at Long Length of Stay Meetings, dates discussed:    Comments:  [id number]/Tameika Heckmann Earlene Plater, RN, BSN, CCM No discharge needs present at time of this review at the sdu/icu level. Case Management [phone]

## 2012-06-11 NOTE — Progress Notes (Signed)
ANTIBIOTIC CONSULT NOTE - INITIAL  Pharmacy Consult for Zosyn Indication:  Perforated prepyloric gastric ulcer  No Known Allergies  Patient Measurements:  Ht: 5\' 2"  Wt: 62.6 kg  Vital Signs: Temp: 97.5 F (36.4 C) (08/06 0105) Temp src: Core (Comment) (08/05 1918) BP: 90/45 mmHg (08/06 0105) Pulse Rate: 80  (08/06 0030) Intake/Output from previous day: 08/05 0701 - 08/06 0700 In: 2300 [I.V.:2300] Out: 510 [Urine:230; Drains:180; Blood:100] Intake/Output from this shift: Total I/O In: 2300 [I.V.:2300] Out: 510 [Urine:230; Drains:180; Blood:100]  Labs:  Basename 06/10/12 1755 06/10/12 1745  WBC -- 8.4  HGB 15.3* 15.1*  PLT -- 275  LABCREA -- --  CREATININE 1.70* 1.95*   CrCl is unknown because there is no height on file for the current visit. No results found for this basename: VANCOTROUGH:2,VANCOPEAK:2,VANCORANDOM:2,GENTTROUGH:2,GENTPEAK:2,GENTRANDOM:2,TOBRATROUGH:2,TOBRAPEAK:2,TOBRARND:2,AMIKACINPEAK:2,AMIKACINTROU:2,AMIKACIN:2, in the last 72 hours   Microbiology: No results found for this or any previous visit (from the past 720 hour(s)).  Medical History: Past Medical History  Diagnosis Date  . Diabetes mellitus   . Hyperthyroidism   . Hypertension     Medications:  Scheduled:    . sodium chloride  1,000 mL Intravenous Once   Followed by  . sodium chloride  1,000 mL Intravenous Once  . ertapenem  1 g Intravenous Once  . fentaNYL  50 mcg Intravenous Once  . fentaNYL  50 mcg Intravenous Once  . heparin  5,000 Units Subcutaneous Q8H  . HYDROmorphone      . insulin aspart  0-15 Units Subcutaneous Q4H  . pantoprazole (PROTONIX) IV  40 mg Intravenous Q12H  . piperacillin-tazobactam (ZOSYN)  IV  3.375 g Intravenous Q8H  . sodium chloride  500 mL Intravenous Once   Infusions:    . dextrose 5 % and 0.9 % NaCl with KCl 20 mEq/L 125 mL/hr at 06/11/12 0134  . DISCONTD: sodium chloride 1,000 mL (06/10/12 1930)   Assessment: 77 yo female c/o abdominal pain  x 2 weeks, s/p exploratory laparotomy which showed perforated prepyloric gastric ulcer.  MD ordering Zosyn post-op.  Goal of Therapy:  Treat infection  Plan:   Zosyn 3.375 Gm IV q8h.  EI infusion.  F/U SCr as needed.  Susanne Greenhouse R 06/11/2012,1:33 AM

## 2012-06-11 NOTE — Progress Notes (Signed)
Patient interviewed and examined. Agree with assessment and care plan outlined by Mr. Marlyne Beards.  [facility] are going to participate in management.  Probably will insert PICC and start TNA tomorrow.   Angelia Mould. Derrell Lolling, M.D., [facility] General and Minimally invasive Surgery Breast and Colorectal Surgery Office:   [phone] Pager:   [phone]

## 2012-06-11 NOTE — Anesthesia Postprocedure Evaluation (Signed)
  Anesthesia Post-op Note  Patient: April [name]  Procedure(s) Performed: Procedure(s) (LRB): EXPLORATORY LAPAROTOMY (N/A)  Patient Location: PACU  Anesthesia Type: General  Level of Consciousness: awake and alert   Airway and Oxygen Therapy: Patient Spontanous Breathing  Post-op Pain: mild  Post-op Assessment: Post-op Vital signs reviewed, Patient's Cardiovascular Status Stable, Respiratory Function Stable, Patent Airway and No signs of Nausea or vomiting  Post-op Vital Signs: stable  Complications: No apparent anesthesia complications

## 2012-06-11 NOTE — Clinical Documentation Improvement (Signed)
GENERIC DOCUMENTATION CLARIFICATION QUERY  THIS DOCUMENT IS NOT A PERMANENT PART OF THE MEDICAL RECORD  TO RESPOND TO THE THIS QUERY, FOLLOW THE INSTRUCTIONS BELOW:  1. If needed, update documentation for the patient's encounter via the notes activity.  2. Access this query again and click edit on the In [employer].  3. After updating, or not, click F2 to complete all highlighted (required) fields concerning your review. Select "additional documentation in the medical record" OR "no additional documentation provided".  4. Click Sign note button.  5. The deficiency will fall out of your In Basket *Please let us know if you are not able to complete this workflow by phone or e-mail (listed below).  Please update your documentation within the medical record to reflect your response to this query.                                                                                        06/11/12   Dear Dr. Angelia Mould [facility],  In a better effort to capture your patient's severity of illness, reflect appropriate length of stay and utilization of resources, a review of the patient medical record has revealed the following indicators.    Based on your clinical judgment, please clarify and document in a progress note and/or discharge summary the clinical condition associated with the following supporting information:  In responding to this query please exercise your independent judgment.  The fact that a query is asked, does not imply that any particular answer is desired or expected.  Pt admitted with gastrointestinal perforation.  According to H/P pt with several differential diagnoses.   Please clarify if "perforated appendix" and peritonitis can be further specified as one of the th diagnoses listed below and document in pn or d/c summary.    Possible Clinical Conditions?  Acute Peritonitis Appendicitis with perforation  _______Other Condition__________________ _______Cannot  Clinically Determine   Supporting Information:  Risk Factors:  Signs & Symptoms: HP Acute abdomen secondary to gastrointestinal perforation. Differential diagnosis includes perforated ulcer, perforated diverticulitis, small intestinal perforation, perforated appendix. She currently has peritonitis on exam.  Plan: Emergency exploratory laparotomy with repair of gastrointestinal perforation, possible intestinal resection and colostomy.  PN 06/11/12 Assessment/Plan Perforated prepyloric gastric ulcer s/p Exploratory laparotomy, primary repair of perforated anterior gastric prepyloric ulcer and omental patching ROSENBOWER,TODD J, 06/10/12 Renal Insuffiencey Hypotension - mild  Diagnostics:  Component     Latest Ref Rng 06/11/2012         3:23 AM  WBC     4.0 - 10.5 K/uL 3.3 (L)   Component     Latest Ref Rng 06/10/2012         6:10 PM  Lactic Acid, Venous     0.5 - 2.2 mmol/L 4.0 (H)   CT Angio IMPRESSION: Pneumoperitoneum with wall thickening and edema as well as mucosal enhancement the stomach.  Perforated gastric ulcer could have this appearance versus other perforated bowel.  Wall thickening mucosal enhancement of the distal small bowel and portions of the colon compatible with enterocolitis without obstruction or pneumatosis.  Diffuse mesenteric congestion with abdominal and pelvic ascites    Treatment  ertapenem (INVANZ) 1 g in sodium chloride 0.9 % 50 mL   HYDROmorphone (DILAUDID) 1 MG/ML injection    ondansetron (ZOFRAN) injection 4 mg [ piperacillin-tazobactam (ZOSYN)  You may use possible, probable, or suspect with inpatient documentation. possible, probable, suspected diagnoses MUST be documented at the time of discharge  Reviewed: additional documentation in the medical record  Thank You,  Enis Slipper RN, BSN, MSN/Inf, CCDS Clinical Documentation Specialist [facility] Pager: [phone] / E-mail: [email or web address]  Health Information  Management [place]

## 2012-06-11 NOTE — Clinical Documentation Improvement (Signed)
RENAL FAILURE DOCUMENTATION CLARIFICATION QUERY  THIS DOCUMENT IS NOT A PERMANENT PART OF THE MEDICAL RECORD  TO RESPOND TO THE THIS QUERY, FOLLOW THE INSTRUCTIONS BELOW:  1. If needed, update documentation for the patient's encounter via the notes activity.  2. Access this query again and click edit on the In [employer].  3. After updating, or not, click F2 to complete all highlighted (required) fields concerning your review. Select "additional documentation in the medical record" OR "no additional documentation provided".  4. Click Sign note button.  5. The deficiency will fall out of your In Basket *Please let us know if you are not able to complete this workflow by phone or e-mail (listed below).  Please update your documentation within the medical record to reflect your response to this query.                                                                                    06/11/12  Dear Dr. Ernestene Mention   In a better effort to capture your patient's severity of illness, reflect appropriate length of stay and utilization of resources, a review of the patient medical record has revealed the following indicators.    Based on your clinical judgment, please clarify and document in a progress note and/or discharge summary the clinical condition associated with the following supporting information:  In responding to this query please exercise your independent judgment.  The fact that a query is asked, does not imply that any particular answer is desired or expected.  Pt admitted w/ gastrointestinal perforation  According to pn 06/11/12 pt w/ renal insufficiency.    Please clarify if "renal insufficiency" in setting of oliguric and elevated creatinine can be further specified as one of the diagnoses listed below.     Possible Clinical Conditions?  _______Acute Renal Failure _______Acute Kidney Injury _______Acute Tubular Necrosis _______Acute on Chronic Renal  Failure  _______Other Condition_____________ _______Cannot Clinically Determine     Supporting Information:  Risk Factors: Gastrointestinal perforation, perforated ulcer, perforated diverticulitis, Small intestine perforation, peritonitis, hyperparathyroidism  Signs and Symptoms:  06/11/12 0849  somewhat volume depleted. Oliguric. Creatinine up a bit. We will get IV fluid boluses   Diagnostics: Component     Latest Ref Rng 06/10/2012         5:45 PM  BUN     6 - 23 mg/dL 27 (H)  Creat     1.47 - 1.10 mg/dL 8.29 (H)   Component     Latest Ref Rng 06/10/2012         5:55 PM  BUN     6 - 23 mg/dL 30 (H)  Creat     5.62 - 1.10 mg/dL 1.30 (H)   Component     Latest Ref Rng 06/11/2012         3:23 AM  BUN     6 - 23 mg/dL 28 (H)  Creat     8.65 - 1.10 mg/dL 7.84 (H)   Component     Latest Ref Rng 06/10/2012         5:45 PM  GFR calc non Af Amer     >90 mL/min  24 (L)  GFR calc Af Amer     >90 mL/min 28 (L)   Component     Latest Ref Rng 06/11/2012         3:23 AM  GFR calc non Af Amer     >90 mL/min 25 (L)  GFR calc Af Amer     >90 mL/min 29 (L)     Treatments: monitoring      0.9 % sodium chloride infusion     You may use possible, probable, or suspect with inpatient documentation. possible, probable, suspected diagnoses MUST be documented at the time of discharge  Reviewed: additional documentation in the medical record  Thank You,  Enis Slipper  RN, BSN, MSN/Inf, CCDS Clinical Documentation Specialist [facility] Pager: [phone] / E-mail: [email or web address]  Health Information Management [place]

## 2012-06-11 NOTE — Consult Note (Addendum)
[facility]   Patient Demographics  April [name], is a 77 y.o. female  CSN: [id number]  MRN: [id number]  DOB - [birth year]  Admit date - 06/10/2012  Admitting Physician Adolph Pollack, MD  Outpatient Primary MD for the patient is No primary provider on file.  LOS - 1   Consult requested in the Hospital by Adolph Pollack, MD, On 06/11/2012    Reason for consult DM,CKD-3 management    Assessment & Plan  1.Perf viscous (gast ulcer)  - post Exp lap, per surg, agree with Zosyn, IV PPI,  JP drain, NG and Bowel rest, Surg managing the primary problem. Patient is n.p.o. due to surgery, I have discussed this with Dr. Derrell Lolling surgeon following the patient, I have come indicated to him that patient might need a PICC line IJ area as she has CKD stage III and TPN and to strongly consider it if patient is not going to take oral diet for the next 5-6 days, he will address it.  If patient becomes hypotensive and show signs of sepsis consider critical care input.    2. CKD-3 - baseline creat 1.85 per Dr powell's office(her nephrologist) , her Creat is slightly higher due to above insult + IV contrast in the ER for CT given yesterday, continue IVF, BMP repeat in AM, will check UA, Ur lytes, if creat gets call [facility] who she follows with, avoid ACE-ARB-NSAIDs, maintain SBP>100, daily I&O and weights, making good urine today.    3.DM-2 - NPO, IVF, agree with q4hr sliding sclae.   CBG (last 3)   Basename 06/11/12 1114 06/11/12 0801 06/11/12 0525  GLUCAP 97 108* 144*     4.H/O Hyperparathyroidism - Calcium NML, outpt follow up with renal.    DVT Prophylaxis  SCDs    Please review my Orders  Family Communication - Plan of care including tests being ordered have been discussed with the patient and son who indicate understanding and agree with the plan.  Thank you for the consult, we will follow the patient with you in the  Hospital.    HPI:-  April [name] CSN:[id number],MRN:[id number] is a 77 y.o. female, with H/O DM-2, CKD 3 (creat 1.85 last per Primary Renal Office- Dr Lowell Guitar), Hyperparathyroidism, who was admitted yesterday by surgery for perforated viscous needing Exp lap, patient had CT Angio done in ER, today was noted to have high creat and I was called for a consult.   Review of Systems    In addition to the HPI above,   No Fever-chills, No Headache, No changes with Vision or hearing, No problems swallowing food or Liquids, No Chest pain, Cough or Shortness of Breath, +ve dull constant non radiating  Abdominal pain worse with movement better at rest, no assosiated symptoms ongoing for >24hrs,  No Nausea or Vommitting, not passing flatus No Blood in stool or Urine, No dysuria, No new skin rashes or bruises, No new joints pains-aches,  No new weakness, tingling, numbness in any extremity, No recent weight gain or loss, No polyuria, polydypsia or polyphagia, No significant Mental Stressors.  A full 10 point review of systems was obtained except as dictated above all other review of systems are negative.     Active Ambulatory Problems    Diagnosis Date Noted  . HTN (hypertension)   . Hyperlipidemia   . Benign tumor of pituitary gland 11/06/2004  . Polyp of colon 11/06/2002  . Hyperparathyroidism, primary 11/06/2005  . DM (diabetes mellitus)    Resolved Ambulatory  Problems    Diagnosis Date Noted  . No Resolved Ambulatory Problems   Past Medical History  Diagnosis Date  . Diabetes mellitus   . Hyperthyroidism   . Hypertension     Past Surgical History  Procedure Date  . 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 Laparotomy 06/10/2012    Procedure: EXPLORATORY LAPAROTOMY;  Surgeon: Adolph Pollack, MD;  Location: [facility];  Service: General;  Laterality: N/A;  closure of perforated gastric ulcer     Social History History  Substance Use Topics  . Smoking status: Never Smoker   .  Smokeless tobacco: Not on file  . Alcohol Use: No      Family History No ESRD,Stomach CA  Prior to Admission medications   Medication Sig Start Date End Date Taking? Authorizing Provider  aspirin 81 MG tablet Take 81 mg by mouth daily.     Yes Historical Provider, MD  atorvastatin (LIPITOR) 20 MG tablet Take 20 mg by mouth daily.     Yes Historical Provider, MD  sitaGLIPtan-metformin (JANUMET) 50-500 MG per tablet Take 1 tablet by mouth 2 (two) times daily with a meal.     Yes Historical Provider, MD    No Known Allergies   Physical Exam   Vitals  Blood pressure 106/51, pulse 82, temperature 98.1 F (36.7 C), temperature source Oral, resp. rate 25, height 5\' 2"  (1.575 m), weight 62.6 kg (138 lb 0.1 oz), SpO2 98.00%.   1. General frail elderly female lying in bed in NAD,  NG in place  2. Normal affect and insight, Not Suicidal or Homicidal, Awake Alert, Oriented X 3.  3. No F.N deficits, ALL C.Nerves Intact, Strength 5/5 all 4 extremities, Sensation intact all   4 extremities, Plantars down going.  4. Ears and Eyes appear Normal, Conjunctivae clear, PERRLA. Moist Oral Mucosa.  5. Supple Neck, No JVD, No cervical lymphadenopathy appriciated, No Carotid Bruits.  6. Symmetrical Chest wall movement, Good air movement bilaterally, CTAB.  7. RRR, No Gallops, Rubs or Murmurs, No Parasternal Heave.  8. Hypoactive Bowel Sounds, Abdomen Soft, generalized tenderness, No organomegaly appriciated, No rebound -guarding or rigidity, has a JP drain in place.  9.  No Cyanosis, Normal Skin Turgor, No Skin Rash or Bruise.  10. Good muscle tone,  joints appear normal , no effusions, Normal ROM.  11. No Palpable Lymph Nodes in Neck or Axillae   Data Review  CBC  Lab 06/11/12 1155 06/11/12 0323 06/10/12 1755 06/10/12 1745  WBC 7.0 3.3* -- 8.4  HGB 12.5 13.1 15.3* 15.1*  HCT 36.6 39.0 45.0 44.7  PLT 181 188 -- 275  MCV 89.3 91.1 -- 92.2  MCH 30.5 30.6 -- 31.1  MCHC 34.2 33.6 --  33.8  RDW 13.5 13.5 -- 13.4  LYMPHSABS -- -- -- 2.8  MONOABS -- -- -- 0.5  EOSABS -- -- -- 0.1  BASOSABS -- -- -- 0.0  BANDABS -- -- -- --    Chemistries   Lab 06/11/12 1155 06/11/12 0323 06/10/12 1755 06/10/12 1745  NA 145 143 145 140  K 4.6 3.8 3.5 3.4*  CL 119* 115* 112 104  CO2 20 19 -- 22  GLUCOSE 96 97 265* 279*  BUN 28* 28* 30* 27*  CREATININE 2.01* 1.91* 1.70* 1.95*  CALCIUM 8.8 9.1 -- 11.9*  MG 1.2* -- -- --  AST -- -- -- 13  ALT -- -- -- 9  ALKPHOS -- -- -- 91  BILITOT -- -- --  0.3   ------------------------------------------------------------------------------------------------------------------ estimated creatinine clearance is 21.4 ml/min (by C-G formula based on Cr of 2.01). ------------------------------------------------------------------------------------------------------------------ No results found for this basename: HGBA1C:2 in the last 72 hours ------------------------------------------------------------------------------------------------------------------ No results found for this basename: CHOL:2,HDL:2,LDLCALC:2,TRIG:2,CHOLHDL:2,LDLDIRECT:2 in the last 72 hours ------------------------------------------------------------------------------------------------------------------ No results found for this basename: TSH,T4TOTAL,FREET3,T3FREE,THYROIDAB in the last 72 hours ------------------------------------------------------------------------------------------------------------------ No results found for this basename: VITAMINB12:2,FOLATE:2,FERRITIN:2,TIBC:2,IRON:2,RETICCTPCT:2 in the last 72 hours  Coagulation profile  Lab 06/10/12 1745  INR 0.93  PROTIME --    No results found for this basename: DDIMER:2 in the last 72 hours  Cardiac Enzymes No results found for this basename: CK:3,CKMB:3,TROPONINI:3,MYOGLOBIN:3 in the last 168  hours ------------------------------------------------------------------------------------------------------------------ No components found with this basename: POCBNP:3  ------------------------------------------------------------------------------------------------------------------- Micro Results Recent Results (from the past 240 hour(s))  MRSA PCR SCREENING     Status: Normal   Collection Time   06/11/12  1:07 AM      Component Value Range Status Comment   MRSA by PCR NEGATIVE  NEGATIVE Final    ------------------------------------------------------------------------------------------------------------------ Radiology Reports Dg Chest Port 1v Same Day  06/10/2012  *RADIOLOGY REPORT*  Clinical Data: Hypertension  PORTABLE CHEST - 1 VIEW SAME DAY  Comparison: 03/28/2012  Findings: Normal heart size and vascularity.  Low lung volumes accentuate the basilar vascular markings.  Negative for CHF or pneumonia.  No large effusion or pneumothorax.  Atherosclerosis of the aorta.  IMPRESSION: Low volume exam without acute process  Original Report Authenticated By: Judie Petit. Ruel Favors, M.D.   Ct Angio Chest Aortic Dissect W &/or W/o  06/10/2012  *RADIOLOGY REPORT*  Clinical Data:  Abdominal pain, hypertension  CT ANGIOGRAPHY CHEST, ABDOMEN AND PELVIS  Technique:  Multidetector CT imaging through the chest, abdomen and pelvis was performed using the standard protocol during bolus administration of intravenous contrast.  Multiplanar reconstructed images including MIPs were obtained and reviewed to evaluate the vascular anatomy.  Contrast: 75mL OMNIPAQUE IOHEXOL 350 MG/ML SOLN  Comparison:   None.  CTA CHEST  Findings:  Diffuse atherosclerotic changes of the aorta, most pronounced of the transverse aorta.  Brachiocephalic, left common carotid, left subclavian arteries all remain patent.  Negative for of thoracic dissection or significant aneurysm.  No mediastinal hemorrhage or hematoma.  Normal heart size.  No  pericardial or pleural effusion.  Negative for adenopathy.  Lung windows demonstrate hyperinflation with centrilobular emphysema diffusely.  Minimal basilar atelectasis.  Anterior left upper lobe 7 mm nodule noted, image 42.  This remains indeterminate.  Negative for acute pneumonia or airspace process. No interstitial change, collapse or consolidation.  Trachea central airways patent.  Diffuse degenerative changes of the spine with mild scoliosis.   Review of the MIP images confirms the above findings.  IMPRESSION: Negative for thoracic aortic dissection.  Thoracic atherosclerosis evident.  COPD/emphysema  7 mm anterior left upper lobe indeterminate nodule. If the patient is at high risk for bronchogenic carcinoma, follow-up chest CT at 6- 12 months is recommended.  If the patient is at low risk for bronchogenic carcinoma, follow-up chest CT at 12 months is recommended.  This recommendation follows the consensus statement: Guidelines for Management of Small Pulmonary Nodules Detected on CT Scans: A Statement from the Fleischner Society as published in Radiology 2005; 237:395-400.  CTA ABDOMEN AND PELVIS  Findings:  Scattered pneumoperitoneum throughout the upper abdomen. Free air present in the left upper quadrant and over the liver as well.  Locules of free air present anterior to the stomach. Diffuse wall thickening of the stomach with mucosal enhancement and  surrounding edema.  Findings could be related to perforated gastric ulcer.  Also, there is diffuse upper abdominal ascites, mesenteric congestion, and bowel wall thickening involving the transverse colon and several loops of small bowel in the lower abdomen.  No associated pneumatosis.  Appearance is nonspecific but consistent with diffuse enterocolitis which could be ischemic, infectious, or inflammatory.  No associated obstruction pattern, dilatation, or ileus.  Atherosclerotic changes of the aorta.  Aortic lumen opacification is limited but no gross  abdominal aortic dissection.  No retroperitoneal hemorrhage.  Heavy calcification of the SMA origin. SMA appears to be patent.  Celiac artery is patent.  Prior cholecystectomy evident.  No biliary dilatation.  No focal hepatic abnormality.  Portal vein is patent.  Biliary system, pancreas, spleen, adrenal glands within normal limits.  Pelvis:  Retained stool throughout entire colon.  Pelvic ascites also noted.  Foley catheter decompresses the bladder. No pelvic hemorrhage, hematoma, adenopathy, inguinal abnormality, or hernia.   Review of the MIP images confirms the above findings.  IMPRESSION: Pneumoperitoneum with wall thickening and edema as well as mucosal enhancement the stomach.  Perforated gastric ulcer could have this appearance versus other perforated bowel.  Wall thickening mucosal enhancement of the distal small bowel and portions of the colon compatible with enterocolitis without obstruction or pneumatosis.  Diffuse mesenteric congestion with abdominal and pelvic ascites  Critical Value/emergent results were called by telephone at the time of interpretation on 06/10/2012 at 7:00 p.m. to Dr. Alto Denver, who verbally acknowledged these results.  Original Report Authenticated By: Judie Petit. Ruel Favors, M.D.   Ct Angio Abd/pel W/ And/or W/o  06/10/2012  *RADIOLOGY REPORT*  Clinical Data:  Abdominal pain, hypertension  CT ANGIOGRAPHY CHEST, ABDOMEN AND PELVIS  Technique:  Multidetector CT imaging through the chest, abdomen and pelvis was performed using the standard protocol during bolus administration of intravenous contrast.  Multiplanar reconstructed images including MIPs were obtained and reviewed to evaluate the vascular anatomy.  Contrast: 75mL OMNIPAQUE IOHEXOL 350 MG/ML SOLN  Comparison:   None.  CTA CHEST  Findings:  Diffuse atherosclerotic changes of the aorta, most pronounced of the transverse aorta.  Brachiocephalic, left common carotid, left subclavian arteries all remain patent.  Negative for of thoracic  dissection or significant aneurysm.  No mediastinal hemorrhage or hematoma.  Normal heart size.  No pericardial or pleural effusion.  Negative for adenopathy.  Lung windows demonstrate hyperinflation with centrilobular emphysema diffusely.  Minimal basilar atelectasis.  Anterior left upper lobe 7 mm nodule noted, image 42.  This remains indeterminate.  Negative for acute pneumonia or airspace process. No interstitial change, collapse or consolidation.  Trachea central airways patent.  Diffuse degenerative changes of the spine with mild scoliosis.   Review of the MIP images confirms the above findings.  IMPRESSION: Negative for thoracic aortic dissection.  Thoracic atherosclerosis evident.  COPD/emphysema  7 mm anterior left upper lobe indeterminate nodule. If the patient is at high risk for bronchogenic carcinoma, follow-up chest CT at 6- 12 months is recommended.  If the patient is at low risk for bronchogenic carcinoma, follow-up chest CT at 12 months is recommended.  This recommendation follows the consensus statement: Guidelines for Management of Small Pulmonary Nodules Detected on CT Scans: A Statement from the Fleischner Society as published in Radiology 2005; 237:395-400.  CTA ABDOMEN AND PELVIS  Findings:  Scattered pneumoperitoneum throughout the upper abdomen. Free air present in the left upper quadrant and over the liver as well.  Locules of free air present anterior to the  stomach. Diffuse wall thickening of the stomach with mucosal enhancement and surrounding edema.  Findings could be related to perforated gastric ulcer.  Also, there is diffuse upper abdominal ascites, mesenteric congestion, and bowel wall thickening involving the transverse colon and several loops of small bowel in the lower abdomen.  No associated pneumatosis.  Appearance is nonspecific but consistent with diffuse enterocolitis which could be ischemic, infectious, or inflammatory.  No associated obstruction pattern, dilatation, or  ileus.  Atherosclerotic changes of the aorta.  Aortic lumen opacification is limited but no gross abdominal aortic dissection.  No retroperitoneal hemorrhage.  Heavy calcification of the SMA origin. SMA appears to be patent.  Celiac artery is patent.  Prior cholecystectomy evident.  No biliary dilatation.  No focal hepatic abnormality.  Portal vein is patent.  Biliary system, pancreas, spleen, adrenal glands within normal limits.  Pelvis:  Retained stool throughout entire colon.  Pelvic ascites also noted.  Foley catheter decompresses the bladder. No pelvic hemorrhage, hematoma, adenopathy, inguinal abnormality, or hernia.   Review of the MIP images confirms the above findings.  IMPRESSION: Pneumoperitoneum with wall thickening and edema as well as mucosal enhancement the stomach.  Perforated gastric ulcer could have this appearance versus other perforated bowel.  Wall thickening mucosal enhancement of the distal small bowel and portions of the colon compatible with enterocolitis without obstruction or pneumatosis.  Diffuse mesenteric congestion with abdominal and pelvic ascites  Critical Value/emergent results were called by telephone at the time of interpretation on 06/10/2012 at 7:00 p.m. to Dr. Alto Denver, who verbally acknowledged these results.  Original Report Authenticated By: Judie Petit. Ruel Favors, M.D.    Scheduled Meds:   . sodium chloride  1,000 mL Intravenous Once   Followed by  . sodium chloride  1,000 mL Intravenous Once  . acetaminophen  1,000 mg Intravenous Q6H  . antiseptic oral rinse  15 mL Mouth Rinse BID  . ertapenem  1 g Intravenous Once  . fentaNYL  50 mcg Intravenous Once  . fentaNYL  50 mcg Intravenous Once  . heparin  5,000 Units Subcutaneous Q8H  . HYDROmorphone      . insulin aspart  0-15 Units Subcutaneous Q4H  . pantoprazole (PROTONIX) IV  40 mg Intravenous Q12H  . piperacillin-tazobactam (ZOSYN)  IV  3.375 g Intravenous Q8H  . sodium chloride  500 mL Intravenous Once  . sodium  chloride  500 mL Intravenous Once  . sodium chloride  500 mL Intravenous Once  . DISCONTD: piperacillin-tazobactam (ZOSYN)  IV  3.375 g Intravenous Q8H   Continuous Infusions:   . sodium chloride    . dextrose 5 % and 0.9% NaCl    . DISCONTD: sodium chloride 1,000 mL (06/10/12 1930)  . DISCONTD: dextrose 5 % and 0.9 % NaCl with KCl 20 mEq/L 125 mL/hr at 06/11/12 0134  . DISCONTD: dextrose 5 % and 0.9% NaCl 125 mL/hr at 06/11/12 1339   PRN Meds:.fentaNYL, iohexol, ondansetron, DISCONTD:  HYDROmorphone (DILAUDID) injection, DISCONTD: promethazine  Time Spent in minutes 35  Leroy Sea M.D on 06/11/2012 at 1:43 PM  [facility] Office  [phone]

## 2012-06-12 ENCOUNTER — Inpatient Hospital Stay (HOSPITAL_COMMUNITY): Payer: PRIVATE HEALTH INSURANCE

## 2012-06-12 DIAGNOSIS — J449 Chronic obstructive pulmonary disease, unspecified: Secondary | ICD-10-CM

## 2012-06-12 DIAGNOSIS — K255 Chronic or unspecified gastric ulcer with perforation: Principal | ICD-10-CM

## 2012-06-12 DIAGNOSIS — J9601 Acute respiratory failure with hypoxia: Secondary | ICD-10-CM

## 2012-06-12 DIAGNOSIS — R911 Solitary pulmonary nodule: Secondary | ICD-10-CM | POA: Diagnosis present

## 2012-06-12 DIAGNOSIS — E46 Unspecified protein-calorie malnutrition: Secondary | ICD-10-CM

## 2012-06-12 DIAGNOSIS — Z87891 Personal history of nicotine dependence: Secondary | ICD-10-CM | POA: Diagnosis present

## 2012-06-12 DIAGNOSIS — K668 Other specified disorders of peritoneum: Secondary | ICD-10-CM

## 2012-06-12 DIAGNOSIS — E119 Type 2 diabetes mellitus without complications: Secondary | ICD-10-CM

## 2012-06-12 DIAGNOSIS — J690 Pneumonitis due to inhalation of food and vomit: Secondary | ICD-10-CM

## 2012-06-12 DIAGNOSIS — D72829 Elevated white blood cell count, unspecified: Secondary | ICD-10-CM | POA: Diagnosis present

## 2012-06-12 DIAGNOSIS — G9340 Encephalopathy, unspecified: Secondary | ICD-10-CM

## 2012-06-12 DIAGNOSIS — J9600 Acute respiratory failure, unspecified whether with hypoxia or hypercapnia: Secondary | ICD-10-CM

## 2012-06-12 LAB — URINE CULTURE
Colony Count: NO GROWTH
Culture: NO GROWTH
Special Requests: NORMAL

## 2012-06-12 LAB — COMPREHENSIVE METABOLIC PANEL
ALT: 31 U/L (ref 0–35)
AST: 25 U/L (ref 0–37)
Alkaline Phosphatase: 62 U/L (ref 39–117)
CO2: 18 mEq/L — ABNORMAL LOW (ref 19–32)
Chloride: 118 mEq/L — ABNORMAL HIGH (ref 96–112)
GFR calc non Af Amer: 23 mL/min — ABNORMAL LOW (ref >90)
Potassium: 4.2 mEq/L (ref 3.5–5.1)
Sodium: 143 mEq/L (ref 135–145)
Total Bilirubin: 0.4 mg/dL (ref 0.3–1.2)

## 2012-06-12 LAB — GLUCOSE, CAPILLARY
Glucose-Capillary: 138 mg/dL — ABNORMAL HIGH (ref 70–99)
Glucose-Capillary: 90 mg/dL (ref 70–99)
Glucose-Capillary: 81 mg/dL (ref 70–99)

## 2012-06-12 LAB — CBC
Hemoglobin: 11 g/dL — ABNORMAL LOW (ref 12.0–15.0)
RBC: 3.62 MIL/uL — ABNORMAL LOW (ref 3.87–5.11)

## 2012-06-12 LAB — MAGNESIUM: Magnesium: 1.2 mg/dL — ABNORMAL LOW (ref 1.5–2.5)

## 2012-06-12 LAB — BLOOD GAS, ARTERIAL
Bicarbonate: 17.8 mEq/L — ABNORMAL LOW (ref 20.0–24.0)
MECHVT: 400 mL
O2 Saturation: 96.9 %
PEEP: 8 cmH2O
Patient temperature: 37

## 2012-06-12 LAB — TROPONIN I: Troponin I: <0.3 ng/mL (ref <0.30)

## 2012-06-12 MED ORDER — SUCCINYLCHOLINE CHLORIDE 20 MG/ML IJ SOLN
INTRAMUSCULAR | Status: AC
  Filled 2012-06-12: qty 10

## 2012-06-12 MED ORDER — LIDOCAINE HCL (CARDIAC) 20 MG/ML IV SOLN
INTRAVENOUS | Status: AC
  Filled 2012-06-12: qty 5

## 2012-06-12 MED ORDER — MAGNESIUM SULFATE 50 % IJ SOLN: 1.0000 g | Freq: Once | INTRAVENOUS | Status: DC

## 2012-06-12 MED ORDER — MAGNESIUM SULFATE 50 % IJ SOLN: 2.0000 g | Freq: Once | INTRAVENOUS | Status: DC

## 2012-06-12 MED ORDER — VANCOMYCIN HCL 1000 MG IV SOLR
750.0000 mg | Freq: Once | INTRAVENOUS | Status: AC
  Administered 2012-06-12: 750 mg via INTRAVENOUS
  Filled 2012-06-12: qty 750

## 2012-06-12 MED ORDER — ROCURONIUM BROMIDE 50 MG/5ML IV SOLN
INTRAVENOUS | Status: AC
  Filled 2012-06-12: qty 2

## 2012-06-12 MED ORDER — VANCOMYCIN HCL 500 MG IV SOLR
500.0000 mg | INTRAVENOUS | Status: DC
  Administered 2012-06-13 – 2012-06-14 (×2): 500 mg via INTRAVENOUS
  Filled 2012-06-12 (×2): qty 500

## 2012-06-12 MED ORDER — FUROSEMIDE 10 MG/ML IJ SOLN
40.0000 mg | Freq: Once | INTRAMUSCULAR | Status: DC
  Filled 2012-06-12: qty 4

## 2012-06-12 MED ORDER — MAGNESIUM SULFATE IN D5W 10-5 MG/ML-% IV SOLN
1.0000 g | Freq: Once | INTRAVENOUS | Status: AC
  Administered 2012-06-12: 1 g via INTRAVENOUS
  Filled 2012-06-12: qty 100

## 2012-06-12 MED ORDER — ETOMIDATE 2 MG/ML IV SOLN
INTRAVENOUS | Status: AC
  Administered 2012-06-12: 20 mg
  Filled 2012-06-12: qty 20

## 2012-06-12 MED ORDER — SODIUM CHLORIDE 0.9 % IJ SOLN
10.0000 mL | Freq: Two times a day (BID) | INTRAMUSCULAR | Status: DC
  Administered 2012-06-13 – 2012-06-19 (×8): 10 mL

## 2012-06-12 MED ORDER — BIOTENE DRY MOUTH MT LIQD
15.0000 mL | Freq: Four times a day (QID) | OROMUCOSAL | Status: DC
  Administered 2012-06-12: 15 mL via OROMUCOSAL

## 2012-06-12 MED ORDER — CHLORHEXIDINE GLUCONATE 0.12 % MT SOLN
15.0000 mL | Freq: Two times a day (BID) | OROMUCOSAL | Status: DC
  Administered 2012-06-12 – 2012-06-21 (×18): 15 mL via OROMUCOSAL
  Filled 2012-06-12 (×22): qty 15

## 2012-06-12 MED ORDER — IPRATROPIUM-ALBUTEROL 18-103 MCG/ACT IN AERO: 6.0000 | INHALATION_SPRAY | RESPIRATORY_TRACT | Status: DC | PRN

## 2012-06-12 MED ORDER — PROPOFOL 10 MG/ML IV EMUL
INTRAVENOUS | Status: AC
  Administered 2012-06-12: 1000 mg
  Filled 2012-06-12: qty 100

## 2012-06-12 MED ORDER — SODIUM CHLORIDE 0.9 % IJ SOLN
10.0000 mL | INTRAMUSCULAR | Status: DC | PRN
  Administered 2012-06-22: 10 mL

## 2012-06-12 MED ORDER — DEXTROSE-NACL 5-0.9 % IV SOLN
INTRAVENOUS | Status: DC
  Administered 2012-06-12: 18:00:00 via INTRAVENOUS

## 2012-06-12 MED ORDER — MAGNESIUM SULFATE 50 % IJ SOLN: 1.0000 g | Freq: Once | INTRAMUSCULAR | Status: DC

## 2012-06-12 MED ORDER — FAT EMULSION 20 % IV EMUL
250.0000 mL | INTRAVENOUS | Status: AC
  Administered 2012-06-12: 250 mL via INTRAVENOUS
  Filled 2012-06-12: qty 250

## 2012-06-12 MED ORDER — IPRATROPIUM-ALBUTEROL 18-103 MCG/ACT IN AERO
6.0000 | INHALATION_SPRAY | RESPIRATORY_TRACT | Status: DC
  Administered 2012-06-12 – 2012-06-14 (×10): 6 via RESPIRATORY_TRACT
  Administered 2012-06-14: 2 via RESPIRATORY_TRACT
  Filled 2012-06-12: qty 14.7

## 2012-06-12 MED ORDER — ZINC TRACE METAL 1 MG/ML IV SOLN
INTRAVENOUS | Status: AC
  Administered 2012-06-12: 19:00:00 via INTRAVENOUS
  Filled 2012-06-12: qty 1000

## 2012-06-12 MED ORDER — FUROSEMIDE 10 MG/ML IJ SOLN
40.0000 mg | Freq: Once | INTRAMUSCULAR | Status: AC
  Administered 2012-06-12: 40 mg via INTRAVENOUS
  Filled 2012-06-12: qty 4

## 2012-06-12 NOTE — Progress Notes (Signed)
Peripherally Inserted Central Catheter/Midline Placement  The IV Nurse has discussed with the patient and/or persons authorized to consent for the patient, the purpose of this procedure and the potential benefits and risks involved with this procedure.  The benefits include less needle sticks, lab draws from the catheter and patient may be discharged home with the catheter.  Risks include, but not limited to, infection, bleeding, blood clot (thrombus formation), and puncture of an artery; nerve damage and irregular heat beat.  Alternatives to this procedure were also discussed.  PICC/Midline Placement Documentation        Vevelyn Pat 06/12/2012, 2:01 PM

## 2012-06-12 NOTE — Progress Notes (Signed)
Spoke with pt's son Denesha Brouse). Informed consent obtained for PICC line placement via IV team. Son was given the risk and the necessity of the line placement. He verbalized understanding. Consent witnessed by Helmut Muster, RN.

## 2012-06-12 NOTE — Progress Notes (Signed)
Entered into pt room and pt was short of breath, O2 sats 77. Pt denied any chest pain. Audible crackles and congestion. Respiratory therapy notified and at the bedside. Pt has been on [place] today sats >92% 2-3L. No episodes of shortness of breath. Incentive spirometer in use and pt tolerating well. Lungs at morning assessment Diminished in bases, clear Upper lobes bil. Dr. Dwain Sarna notified of condition and orders given and carried out. Dr. Delford Field also notified along with family members of respiratory change. Will continue to closely monitor pt and carry out any further orders.

## 2012-06-12 NOTE — Progress Notes (Signed)
eLink Physician-Brief Progress Note Patient Name: April [name] DOB: Nov 11, 1934 MRN: [id number]  Date of Service  06/12/2012   HPI/Events of Note   Lab 06/12/12 0320 06/11/12 1155 06/11/12 0323 06/10/12 1755 06/10/12 1745  NA 143 145 143 145 140  K 4.2 4.6 -- -- --  CL 118* 119* 115* 112 104  CO2 18* 20 19 -- 22  GLUCOSE 136* 96 97 265* 279*  BUN 27* 28* 28* 30* 27*  CREATININE 2.04* 2.01* 1.91* 1.70* 1.95*  CALCIUM 8.3* 8.8 9.1 -- 11.9*  MG 1.2* 1.2* -- -- --  PHOS -- -- -- -- --      eICU Interventions  Replete mag with 1gm mag sulfate   Intervention Category Intermediate Interventions: Electrolyte abnormality - evaluation and management  Gamble Enderle 06/12/2012, 5:04 AM

## 2012-06-12 NOTE — Progress Notes (Signed)
Spoke w/Dr. Derrell Lolling regarding CR/BUN (27/2.04) and PICC line placement protocol. Per Dr. Derrell Lolling he is aware of her kidney functions and wishes for IV team to proceed with PICC line placement as pt needs the access for optimum care. IV team notified.

## 2012-06-12 NOTE — Progress Notes (Signed)
Spoke with Dr Delford Field regarding unsuccessful attempts with abg on left side.  Per Dr.  Delford Field, ok to stick on right, same side of PICC.

## 2012-06-12 NOTE — Progress Notes (Signed)
PARENTERAL NUTRITION CONSULT NOTE - INITIAL  Pharmacy Consult for TNA Indication: s/p gastric surgery, PCM.  No Known Allergies  Patient Measurements: Height: 5\' 2"  (157.5 cm) Weight: 138 lb 0.1 oz (62.6 kg) IBW/kg (Calculated) : 50.1   Vital Signs: Temp: 98.3 F (36.8 C) (08/07 0000) Temp src: Oral (08/07 0000) BP: 126/50 mmHg (08/07 0500) Pulse Rate: 76  (08/07 0500) Intake/Output from previous day: 08/06 0701 - 08/07 0700 In: 3041.7 [I.V.:2375; IV Piggyback:666.7] Out: 1615 [Urine:1145; Emesis/NG output:400; Drains:70] Intake/Output from this shift:    Labs:  Basename 06/12/12 0320 06/11/12 1155 06/11/12 0323 06/10/12 1745  WBC 13.3* 7.0 3.3* --  HGB 11.0* 12.5 13.1 --  HCT 32.5* 36.6 39.0 --  PLT 178 181 188 --  APTT -- -- -- --  INR -- -- -- 0.93    Basename 06/12/12 0320 06/11/12 1547 06/11/12 1155 06/11/12 0323 06/10/12 1745  NA 143 -- 145 143 --  K 4.2 -- 4.6 3.8 --  CL 118* -- 119* 115* --  CO2 18* -- 20 19 --  GLUCOSE 136* -- 96 97 --  BUN 27* -- 28* 28* --  CREATININE 2.04* -- 2.01* 1.91* --  LABCREA -- 60.61 -- -- --  CREAT24HRUR -- -- -- -- --  CALCIUM 8.3* -- 8.8 9.1 --  MG 1.2* -- 1.2* -- --  PHOS -- -- -- -- --  PROT 4.1* -- -- -- 6.4  ALBUMIN 1.7* -- -- -- 3.4*  AST 25 -- -- -- 13  ALT 31 -- -- -- 9  ALKPHOS 62 -- -- -- 91  BILITOT 0.4 -- -- -- 0.3  BILIDIR -- -- -- -- --  IBILI -- -- -- -- --  PREALBUMIN -- -- -- -- --  TRIG -- -- -- -- --  CHOLHDL -- -- -- -- --  CHOL -- -- -- -- --   Estimated Creatinine Clearance: 21 ml/min (by C-G formula based on Cr of 2.04).    Basename 06/12/12 0338 06/11/12 2325 06/11/12 1949  GLUCAP 138* 137* 110*    Medical History: Past Medical History  Diagnosis Date  . Diabetes mellitus   . Hyperthyroidism   . Hypertension     Medications:  Scheduled:    . acetaminophen  1,000 mg Intravenous Q6H  . antiseptic oral rinse  15 mL Mouth Rinse BID  . heparin  5,000 Units Subcutaneous Q8H  .  insulin aspart  0-15 Units Subcutaneous Q4H  . magnesium sulfate 1 - 4 g bolus IVPB  1 g Intravenous Once  . pantoprazole (PROTONIX) IV  40 mg Intravenous Q12H  . piperacillin-tazobactam (ZOSYN)  IV  3.375 g Intravenous Q8H  . sodium chloride  500 mL Intravenous Once  . sodium chloride  500 mL Intravenous Once  . DISCONTD: magnesium sulfate LVP 250-500 ml  1 g Intravenous Once  . DISCONTD: magnesium sulfate LVP 250-500 ml  1 g Intravenous Once  . DISCONTD: magnesium sulfate LVP 250-500 ml  2 g Intravenous Once  . DISCONTD: piperacillin-tazobactam (ZOSYN)  IV  3.375 g Intravenous Q8H   Nutritional Goals: 1600-1900Kcal/day and protein 80-100g/day. Clinimix E 5/15 at goal rate 59ml/hr + lipids at 5ml/hr on MWF will provide:  100/day protein and 1894Kcal/day MWF, 1414Kcal/day STTHS(Avg. 1620Kcal/day weekly).  Current nutrition: NPO  CBGs & Insulin requirements past 24 hours: Moderate SSI q4h.  IVF: D5NS at 142ml/hr  Labs:   Electrolytes - wnl except Mag low(bolus ordered). Corr Ca 10.1.   LFTs - wnl  TGs -  none this admission  Prealbumin - none this admission  Assessment: [name] s/p repair of perforated prepyloric ulcer on 06/10/12. TNA started d/t need for bowel rest.  Plan: At 1800 today:  Start Clinimix E 5/15 at 57ml/hr  Fat emulsion at 14ml/hr(MWF only due to ongoing shortage).  Plan to advance as tolerated to a goal rate of 51ml/hr + 45ml/hr to provide: 100g/day protein and 1894Kcal/day MWF, 1414Kcal/day STTHS(Avg. 1620Kcal/day weekly).  TNA to contain standard multivitamins and trace elements(MWF only due to ongoing shortage).  Reduce IVF to 36ml/hr.   TNA lab panels on Mondays & Thursdays.  F/u daily.  Charolotte Eke, PharmD, pager [phone]. 06/12/2012,7:46 AM.

## 2012-06-12 NOTE — Procedures (Addendum)
Name: [name] MRN: [id number] DOB: 04-Nov-1935   PROCEDURE NOTE  Procedure:  Endotracheal intubation.  Indication:  Acute respiratory failure  Consent:  Consent was implied due to the emergency nature of the procedure.  Anesthesia:  A total of 10 mg of Etomidate was given intravenously.  Procedure summary:  Appropriate equipment was assembled. The patient was identified as April [name] and safety timeout was performed. The patient was placed supine, with head in sniffing position. After adequate level of anesthesia was achieved, a Mac 3 blade was inserted into the oropharynx and the vocal cords were visualized. A 7.5 endotracheal tube was inserted withoout difficulty and visualized going through the vocal cords. The stylette was removed and cuff inflated. Colorimetric change was noted on the CO2 meter. Breath sounds were heard over both lung fields equally. Tube was secured @ 23 cm. Post procedure chest xray was ordered.  Complications:  No immediate complications were noted.  Hemodynamic parameters and oxygenation remained stable throughout the procedure.    Orlean Bradford, M.D. Pulmonary and Critical Care Medicine [facility] Cell: [phone] Pager: [phone]  06/12/2012, 9:30 PM     NOTE THE FILE TIME ON THIS NOTE IS INCORRECT> THIS PROCEDURE WAS DONE AT 830PM  NOT 830AM

## 2012-06-12 NOTE — Progress Notes (Signed)
INITIAL ADULT NUTRITION ASSESSMENT Date: 06/12/2012   Time: 10:52 AM Reason for Assessment: Consult for new TNA  ASSESSMENT: Female 77 y.o.  Dx: Perforated gastric ulcer  Hx:  Past Medical History  Diagnosis Date  . Diabetes mellitus   . Hyperthyroidism   . Hypertension     Related Meds:  Scheduled Meds:   . acetaminophen  1,000 mg Intravenous Q6H  . antiseptic oral rinse  15 mL Mouth Rinse BID  . heparin  5,000 Units Subcutaneous Q8H  . insulin aspart  0-15 Units Subcutaneous Q4H  . magnesium sulfate 1 - 4 g bolus IVPB  1 g Intravenous Once  . pantoprazole (PROTONIX) IV  40 mg Intravenous Q12H  . piperacillin-tazobactam (ZOSYN)  IV  3.375 g Intravenous Q8H  . sodium chloride  500 mL Intravenous Once  . DISCONTD: magnesium sulfate LVP 250-500 ml  1 g Intravenous Once  . DISCONTD: magnesium sulfate LVP 250-500 ml  1 g Intravenous Once  . DISCONTD: magnesium sulfate LVP 250-500 ml  2 g Intravenous Once  . DISCONTD: piperacillin-tazobactam (ZOSYN)  IV  3.375 g Intravenous Q8H   Continuous Infusions:   . sodium chloride 500 mL/hr at 06/11/12 1433  . dextrose 5 % and 0.9% NaCl 1,000 mL (06/12/12 0102)  . dextrose 5 % and 0.9% NaCl    . TPN (CLINIMIX) +/- additives     And  . fat emulsion    . DISCONTD: dextrose 5 % and 0.9 % NaCl with KCl 20 mEq/L 125 mL/hr at 06/11/12 0134  . DISCONTD: dextrose 5 % and 0.9% NaCl 125 mL/hr at 06/11/12 1339   PRN Meds:.fentaNYL, ondansetron   Ht: 5\' 2"  (157.5 cm)  Wt: 138 lb 0.1 oz (62.6 kg)  Ideal Wt: 50.1 kg  % Ideal Wt: 125.5% Wt Readings from Last 10 Encounters:  06/11/12 138 lb 0.1 oz (62.6 kg)  06/11/12 138 lb 0.1 oz (62.6 kg)    Body mass index is 25.24 kg/(m^2). (Overweight)  Food/Nutrition Related Hx: No family present at time of RD visit. Discussed patient in rounds. Noted pt with low Mag, per pharmacy bolus ordered. Patient to start TNA today. Patient on bowel rest. Noted pt disorientated this morning.   Labs:   CMP     Component Value Date/Time   NA 143 06/12/2012 0320   K 4.2 06/12/2012 0320   CL 118* 06/12/2012 0320   CO2 18* 06/12/2012 0320   GLUCOSE 136* 06/12/2012 0320   BUN 27* 06/12/2012 0320   CREATININE 2.04* 06/12/2012 0320   CALCIUM 8.3* 06/12/2012 0320   PROT 4.1* 06/12/2012 0320   ALBUMIN 1.7* 06/12/2012 0320   AST 25 06/12/2012 0320   ALT 31 06/12/2012 0320   ALKPHOS 62 06/12/2012 0320   BILITOT 0.4 06/12/2012 0320   GFRNONAA 23* 06/12/2012 0320   GFRAA 26* 06/12/2012 0320    Intake/Output Summary (Last 24 hours) at 06/12/12 1053 Last data filed at 06/12/12 0600  Gross per 24 hour  Intake 2566.67 ml  Output   1315 ml  Net 1251.67 ml      Diet Order: NPO  Supplements/Tube Feeding: none at this time  IVF:    sodium chloride Last Rate: 500 mL/hr at 06/11/12 1433  dextrose 5 % and 0.9% NaCl Last Rate: 1,000 mL (06/12/12 0102)  dextrose 5 % and 0.9% NaCl   TPN (CLINIMIX) +/- additives   And   fat emulsion   DISCONTD: dextrose 5 % and 0.9 % NaCl with KCl 20 mEq/L Last  Rate: 125 mL/hr at 06/11/12 0134  DISCONTD: dextrose 5 % and 0.9% NaCl Last Rate: 125 mL/hr at 06/11/12 1339    Estimated Nutritional Needs:   Kcal: 1250-1550 Protein: 69-81.5 grams Fluid: 1 ml per kcal intake  NUTRITION DIAGNOSIS: -Inadequate oral intake (NI-2.1).  Status: Ongoing  RELATED TO: inability to eat   AS EVIDENCE BY: NPO for bowel rest  MONITORING/EVALUATION(Goals): TNA per pharmacy, diet advancements/ tolerance, weights, labs, I/O's 1. Meet > 90% of estimated energy needs with nutrition support.  2. Diet advancements as medically able.   EDUCATION NEEDS: -No education needs identified at this time  INTERVENTION: 1. TNA per pharmacy. 2. RD to follow for nutrition plan of care.   Dietitian [phone]  DOCUMENTATION CODES Per approved criteria  -Not Applicable    Iven Finn [facility] 06/12/2012, 10:52 AM

## 2012-06-12 NOTE — Progress Notes (Signed)
2 Days Post-Op  Subjective: Alert. Pictures questions appropriately but is a little bit disoriented and think she is at home. Urine output better. Blood pressure better. Stable. No bleeding.  Appreciate  Advice and management by [facility] hospitalist.  Hemoglobin stable, 11.0. WBC 13,300. Creatinine 2.4, BUN 27. Magnesium 1.2, being treated and replaced.CBG's 75-138 range.  Objective: Vital signs in last 24 hours: Temp:  [97.4 F (36.3 C)-98.8 F (37.1 C)] 98.3 F (36.8 C) (08/07 0000) Pulse Rate:  [73-88] 76  (08/07 0500) Resp:  [21-29] 22  (08/07 0500) BP: (80-126)/(40-54) 126/50 mmHg (08/07 0500) SpO2:  [94 %-99 %] 94 % (08/07 0500) Last BM Date: 06/10/12  Intake/Output from previous day: 08/06 0701 - 08/07 0700 In: 3041.7 [I.V.:2375; IV Piggyback:666.7] Out: 1615 [Urine:1145; Emesis/NG output:400; Drains:70] Intake/Output this shift: Total I/O In: 1287.5 [I.V.:1125; IV Piggyback:162.5] Out: 1055 [Urine:585; Emesis/NG output:400; Drains:70]  General appearance: elderly. Appears deconditioned. Awake and alert. Mild confusion. No distress. Skin warm and dry. Resp: clear to auscultation bilaterally GI: abdomen is soft, appropriately sore. Midline wound with skin left open is clean. No bowel sounds. JP draining serosanguineous.  Lab Results:  Results for orders placed during the hospital encounter of 06/10/12 (from the past 24 hour(s))  GLUCOSE, CAPILLARY     Status: Abnormal   Collection Time   06/11/12  8:01 AM      Component Value Range   Glucose-Capillary 108 (*) 70 - 99 mg/dL   Comment 1 Documented in Chart     Comment 2 Notify RN    GLUCOSE, CAPILLARY     Status: Normal   Collection Time   06/11/12 11:14 AM      Component Value Range   Glucose-Capillary 97  70 - 99 mg/dL   Comment 1 Documented in Chart     Comment 2 Notify RN    CBC     Status: Normal   Collection Time   06/11/12 11:55 AM      Component Value Range   WBC 7.0  4.0 - 10.5 K/uL   RBC 4.10  3.87 - 5.11  MIL/uL   Hemoglobin 12.5  12.0 - 15.0 g/dL   HCT 16.1  09.6 - 04.5 %   MCV 89.3  78.0 - 100.0 fL   MCH 30.5  26.0 - 34.0 pg   MCHC 34.2  30.0 - 36.0 g/dL   RDW 40.9  81.1 - 91.4 %   Platelets 181  150 - 400 K/uL  BASIC METABOLIC PANEL     Status: Abnormal   Collection Time   06/11/12 11:55 AM      Component Value Range   Sodium 145  135 - 145 mEq/L   Potassium 4.6  3.5 - 5.1 mEq/L   Chloride 119 (*) 96 - 112 mEq/L   CO2 20  19 - 32 mEq/L   Glucose, Bld 96  70 - 99 mg/dL   BUN 28 (*) 6 - 23 mg/dL   Creatinine, Ser 7.82 (*) 0.50 - 1.10 mg/dL   Calcium 8.8  8.4 - 95.6 mg/dL   GFR calc non Af Amer 23 (*) >90 mL/min   GFR calc Af Amer 27 (*) >90 mL/min  MAGNESIUM     Status: Abnormal   Collection Time   06/11/12 11:55 AM      Component Value Range   Magnesium 1.2 (*) 1.5 - 2.5 mg/dL  URINALYSIS, ROUTINE W REFLEX MICROSCOPIC     Status: Abnormal   Collection Time   06/11/12  3:47 PM      Component Value Range   Color, Urine YELLOW  YELLOW   APPearance CLOUDY (*) CLEAR   Specific Gravity, Urine 1.033 (*) 1.005 - 1.030   pH 5.0  5.0 - 8.0   Glucose, UA NEGATIVE  NEGATIVE mg/dL   Hgb urine dipstick LARGE (*) NEGATIVE   Bilirubin Urine NEGATIVE  NEGATIVE   Ketones, ur NEGATIVE  NEGATIVE mg/dL   Protein, ur 30 (*) NEGATIVE mg/dL   Urobilinogen, UA 1.0  0.0 - 1.0 mg/dL   Nitrite NEGATIVE  NEGATIVE   Leukocytes, UA NEGATIVE  NEGATIVE  SODIUM, URINE, RANDOM     Status: Normal   Collection Time   06/11/12  3:47 PM      Component Value Range   Sodium, Ur 62    CREATININE, URINE, RANDOM     Status: Normal   Collection Time   06/11/12  3:47 PM      Component Value Range   Creatinine, Urine 60.61    URINE MICROSCOPIC-ADD ON     Status: Abnormal   Collection Time   06/11/12  3:47 PM      Component Value Range   Squamous Epithelial / LPF FEW (*) RARE   Bacteria, UA RARE  RARE   Casts GRANULAR CAST (*) NEGATIVE   Urine-Other AMORPHOUS URATES/PHOSPHATES    GLUCOSE, CAPILLARY     Status:  Normal   Collection Time   06/11/12  4:14 PM      Component Value Range   Glucose-Capillary 75  70 - 99 mg/dL   Comment 1 Documented in Chart     Comment 2 Notify RN    GLUCOSE, CAPILLARY     Status: Abnormal   Collection Time   06/11/12  7:49 PM      Component Value Range   Glucose-Capillary 110 (*) 70 - 99 mg/dL   Comment 1 Documented in Chart     Comment 2 Notify RN    GLUCOSE, CAPILLARY     Status: Abnormal   Collection Time   06/11/12 11:25 PM      Component Value Range   Glucose-Capillary 137 (*) 70 - 99 mg/dL   Comment 1 Notify RN    CBC     Status: Abnormal   Collection Time   06/12/12  3:20 AM      Component Value Range   WBC 13.3 (*) 4.0 - 10.5 K/uL   RBC 3.62 (*) 3.87 - 5.11 MIL/uL   Hemoglobin 11.0 (*) 12.0 - 15.0 g/dL   HCT 16.1 (*) 09.6 - 04.5 %   MCV 89.8  78.0 - 100.0 fL   MCH 30.4  26.0 - 34.0 pg   MCHC 33.8  30.0 - 36.0 g/dL   RDW 40.9  81.1 - 91.4 %   Platelets 178  150 - 400 K/uL  MAGNESIUM     Status: Abnormal   Collection Time   06/12/12  3:20 AM      Component Value Range   Magnesium 1.2 (*) 1.5 - 2.5 mg/dL  COMPREHENSIVE METABOLIC PANEL     Status: Abnormal   Collection Time   06/12/12  3:20 AM      Component Value Range   Sodium 143  135 - 145 mEq/L   Potassium 4.2  3.5 - 5.1 mEq/L   Chloride 118 (*) 96 - 112 mEq/L   CO2 18 (*) 19 - 32 mEq/L   Glucose, Bld 136 (*) 70 - 99 mg/dL   BUN  27 (*) 6 - 23 mg/dL   Creatinine, Ser 1.61 (*) 0.50 - 1.10 mg/dL   Calcium 8.3 (*) 8.4 - 10.5 mg/dL   Total Protein 4.1 (*) 6.0 - 8.3 g/dL   Albumin 1.7 (*) 3.5 - 5.2 g/dL   AST 25  0 - 37 U/L   ALT 31  0 - 35 U/L   Alkaline Phosphatase 62  39 - 117 U/L   Total Bilirubin 0.4  0.3 - 1.2 mg/dL   GFR calc non Af Amer 23 (*) >90 mL/min   GFR calc Af Amer 26 (*) >90 mL/min  GLUCOSE, CAPILLARY     Status: Abnormal   Collection Time   06/12/12  3:38 AM      Component Value Range   Glucose-Capillary 138 (*) 70 - 99 mg/dL   Comment 1 Notify RN        Studies/Results: @RISRSLT24 @     . acetaminophen  1,000 mg Intravenous Q6H  . antiseptic oral rinse  15 mL Mouth Rinse BID  . heparin  5,000 Units Subcutaneous Q8H  . insulin aspart  0-15 Units Subcutaneous Q4H  . magnesium sulfate 1 - 4 g bolus IVPB  1 g Intravenous Once  . pantoprazole (PROTONIX) IV  40 mg Intravenous Q12H  . piperacillin-tazobactam (ZOSYN)  IV  3.375 g Intravenous Q8H  . sodium chloride  500 mL Intravenous Once  . sodium chloride  500 mL Intravenous Once  . DISCONTD: magnesium sulfate LVP 250-500 ml  1 g Intravenous Once  . DISCONTD: magnesium sulfate LVP 250-500 ml  1 g Intravenous Once  . DISCONTD: magnesium sulfate LVP 250-500 ml  2 g Intravenous Once  . DISCONTD: piperacillin-tazobactam (ZOSYN)  IV  3.375 g Intravenous Q8H     Assessment/Plan: s/p Procedure(s): EXPLORATORY LAPAROTOMY   POD #2. Closure and patch of perforated prepyloric ulcer. Stable. Continue NG tube and bowel rest Insert PICC line and start TNA. Double dose proton pump inhibitors If remains stable, consider Gastrografin swallow on postop day #5 or 6. Check H. Pylori. Mobilize out of bed.  Protein calorie malnutrition. 2 start TNA today.  Diabetes mellitus. All sliding-scale insulin. Fairly well controlled.  Chronic kidney disease. Nonoliguric. Monitor.    LOS: 2 days    April [name] M. Derrell Lolling, M.D., [facility] General and Minimally invasive Surgery Breast and Colorectal Surgery Office:   [phone] Pager:   [phone]  06/12/2012  . .prob

## 2012-06-12 NOTE — Progress Notes (Signed)
Patient with increased wob, tachypnea and decreased sats not responsive to o2.  Abdomen as expected with serous fluid in drain.  Appreciate CCM consult and care.  Patient to be intubated.

## 2012-06-12 NOTE — Consult Note (Signed)
Name: April [name] MRN: [id number] DOB: Oct 31, 1935    LOS: 2  Referring Provider:  CCS Reason for Referral:  Acute respiratory failure  PULMONARY / CRITICAL CARE MEDICINE  The patient is encephalopathic and unable to provide history, which was obtained for available medical records.  HPI:  77 yo with COPD admitted on 8/5 with gastric ulcer perforation s/p exploratory lap with ulcer repair.  Developed respiratory distress on 8/7.  Intubated.  Past Medical History  Diagnosis Date  . Diabetes mellitus   . Hyperthyroidism   . Hypertension    Past Surgical History  Procedure Date  . 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 Laparotomy 06/10/2012    Procedure: EXPLORATORY LAPAROTOMY;  Surgeon: Adolph Pollack, MD;  Location: [facility] ORS;  Service: General;  Laterality: N/A;  closure of perforated gastric ulcer   Prior to Admission medications   Medication Sig Start Date End Date Taking? Authorizing Provider  aspirin 81 MG tablet Take 81 mg by mouth daily.     Yes Historical Provider, MD  atorvastatin (LIPITOR) 20 MG tablet Take 20 mg by mouth daily.     Yes Historical Provider, MD  sitaGLIPtan-metformin (JANUMET) 50-500 MG per tablet Take 1 tablet by mouth 2 (two) times daily with a meal.     Yes Historical Provider, MD   Allergies No Known Allergies  Family History History reviewed. No pertinent family history.  Social History  reports that she has never smoked. She does not have any smokeless tobacco history on file. She reports that she does not drink alcohol or use illicit drugs.  Review Of Systems:  Unable to provide  Brief patient description:  77 yo with COPD admitted on 8/5 with gastric ulcer perforation s/p exploratory lap with ulcer repair.  Developed respiratory distress on 8/7.  Intubated.  Events Since Admission: 8/5  Admitted with gastric ulcer perforation, exploratory lap with ulcer repair 8/7  Developed respiratory distress, intubated  Current Status:  Vital  Signs: Temp:  [97.8 F (36.6 C)-98.3 F (36.8 C)] 97.8 F (36.6 C) (08/07 1600) Pulse Rate:  [73-80] 77  (08/07 1100) Resp:  [21-29] 25  (08/07 1100) BP: (93-126)/(41-55) 117/52 mmHg (08/07 1100) SpO2:  [93 %-97 %] 95 % (08/07 1100)  Physical Examination: General:  Mechanically ventilated, synchronous Neuro:  Sedated HEENT:  NGT, OETT Neck:  No JVD Cardiovascular:  RRR, tachycardic, no murmurs Lungs:  Bilateral diminished air entry, scattered rhonchi / rales Abdomen:  Soft, bowel sounds absent, surgical dressing intact Musculoskeletal:  No edema Skin:  Intact  Principal Problem:  *Perforated gastric ulcer Active Problems:  HTN (hypertension)  DM (diabetes mellitus)  Hypercalcemia  CRF (chronic renal failure)  Lung nodule  Hypomagnesemia  Leukocytosis  COPD (chronic obstructive pulmonary disease)  Acute respiratory failure with hypoxia  Acute encephalopathy  Aspiration pneumonia  ASSESSMENT AND PLAN  PULMONARY No results found for this basename: PHART:5,PCO2:5,PCO2ART:5,PO2ART:5,HCO3:5,O2SAT:5 in the last 168 hours  Ventilator Settings:    CXR:  8/7 >>> Hardware good position, bibasilar airspace disease R>L ETT:  8/7 >>>  A:  Acute hypoxemic respiratory failure.  More likely secondary to aspiration pneumonia given history and infiltrate distribution.  Less likely pulmonary edema.  History of COPD, no clear evidence of exacerbation. P:   Full vent support ABG CXR Bronchodilators  CARDIOVASCULAR  Lab 06/10/12 1810  TROPONINI --  LATICACIDVEN 4.0*  PROBNP --   ECG:  8/6 >>> NSR, anterolateral ischemia? Lines: R PICC 8/6 >>>  A: Hemodynamically stable.  Question of anterolateral ischemia.  Elevated lactate on admission.  History of dyslipidemia. P:  Repeat ECG Trend Lactate Trend Troponin Repeat 12 lead ECG Holding preadmission ASA, Lipitor  RENAL  Lab 06/12/12 0320 06/11/12 1155 06/11/12 0323 06/10/12 1755 06/10/12 1745  NA 143 145 143 145 140    K 4.2 4.6 -- -- --  CL 118* 119* 115* 112 104  CO2 18* 20 19 -- 22  BUN 27* 28* 28* 30* 27*  CREATININE 2.04* 2.01* 1.91* 1.70* 1.95*  CALCIUM 8.3* 8.8 9.1 -- 11.9*  MG 1.2* 1.2* -- -- --  PHOS -- -- -- -- --   Intake/Output      08/07 0701 - 08/08 0700   I.V. (mL/kg) 1500 (24)   IV Piggyback    Total Intake(mL/kg) 1500 (24)   Urine (mL/kg/hr) 775 (0.9)   Emesis/NG output 225   Drains 75   Total Output 1075   Net +425        Foley:  8/5 >>>  A:  Suspected acute renal failure.  Hypomagnesemia.  Clinically, no evidence of fluid overload. P:   Magnesium replaced IVF KVO as suspected pulmonary edema Would not diurese at this time Trend BMP, Mg  GASTROINTESTINAL  Lab 06/12/12 0320 06/10/12 1745  AST 25 13  ALT 31 9  ALKPHOS 62 91  BILITOT 0.4 0.3  PROT 4.1* 6.4  ALBUMIN 1.7* 3.4*   A:  Perforated gastric ulcer s/p repair. P:   Per Surgery NPO NG to Sx TNA  HEMATOLOGIC  Lab 06/12/12 0320 06/11/12 1155 06/11/12 0323 06/10/12 1755 06/10/12 1745  HGB 11.0* 12.5 13.1 15.3* 15.1*  HCT 32.5* 36.6 39.0 45.0 44.7  PLT 178 181 188 -- 275  INR -- -- -- -- 0.93  APTT -- -- -- -- --   A:  Mild postop anemia. P:  Trend CBC  INFECTIOUS  Lab 06/12/12 0320 06/11/12 1155 06/11/12 0323 06/10/12 1745  WBC 13.3* 7.0 3.3* 8.4  PROCALCITON -- -- -- --   Cultures: 8/5  Blood >>> ntd 8/7  Respiratory >>>  Antibiotics: 8/6  Zosyn >>> 8/7  Vancomycin >>>  A:  Peritonitis.  Suspected aspiration pneumonia. P:   Add Vancomycin PCT  ENDOCRINE  Lab 06/12/12 1747 06/12/12 1128 06/12/12 0802 06/12/12 0338 06/11/12 2325  GLUCAP 81 90 89 138* 137*   A:  Normoglycemic.  DM.  History of hyperthyroidism, not on treatment. P:   Holding preadmission Janumet  NEUROLOGIC  A:  Acute encephalopathy (hypoxia, sedation).  Postop pain. P:   Propofol gtt for synchrony Fentanyl PRN postop pain  BEST PRACTICE / DISPOSITION Level of Care:  ICU Primary Service:   CCS Consultants:  PCCM Code Status:  Full Diet:  NPO / TNA DVT Px:  Heparin GI Px:  Protonix Skin Integrity:  Intact Social / Family:  Updated at bedside  The patient is critically ill with multiple organ systems failure and requires high complexity decision making for assessment and support, frequent evaluation and titration of therapies, application of advanced monitoring technologies and extensive interpretation of multiple databases. Critical Care Time devoted to patient care services described in this note is 45 minutes.  Lonia Farber, M.D. Pulmonary and Critical Care Medicine [facility] Pager: [phone]  06/12/2012, 8:45 PM

## 2012-06-12 NOTE — Progress Notes (Signed)
ANTIBIOTIC CONSULT NOTE - Follow Up Consult  Pharmacy Consult for:  Vancomycin Indication:   Suspected pneumonia  No Known Allergies  Patient Measurements: Height: 5\' 2"  (157.5 cm) Weight: 144 lb 13.5 oz (65.7 kg) IBW/kg (Calculated) : 50.1   Vital Signs: Temp: 97.8 F (36.6 C) (08/07 1600) Temp src: Oral (08/07 1600) BP: 110/58 mmHg (08/07 2200) Pulse Rate: 74  (08/07 2200)  Labs:  Basename 06/12/12 2138 06/12/12 0320 06/11/12 1155 06/11/12 0323 06/10/12 1745  HGB -- 11.0* 12.5 -- --  HCT -- 32.5* 36.6 39.0 --  PLT -- 178 181 188 --  APTT -- -- -- -- --  LABPROT -- -- -- -- 12.7  INR -- -- -- -- 0.93  HEPARINUNFRC -- -- -- -- --  CREATININE -- 2.04* 2.01* 1.91* --  CKTOTAL -- -- -- -- --  CKMB -- -- -- -- --  TROPONINI <0.30 -- -- -- --    Estimated Creatinine Clearance: 21.5 ml/min (by C-G formula based on Cr of 2.04).  Assessment:  Asked to assist with Vancomycin therapy for this 77 year-old female who is also receiving Zosyn.Marland Kitchen   Required intubation this evening due to acute respiratory failure.  Goal of Therapy:   Vancomycin trough levels 15-20 mcg/ml  Adjustment of dose for renal insufficiency  Eradication of infection   Plan:  Vancomycin 750 mg as initial dose, then 500mg  every 24 hours thereafter.  Polo Riley R.Ph. 06/12/2012,10:19 PM

## 2012-06-12 NOTE — Progress Notes (Signed)
eLink Physician-Brief Progress Note Patient Name: April [name] DOB: 1934-12-14 MRN: [id number]  Date of Service  06/12/2012   HPI/Events of Note   Pt in acute respiratory distress  eICU Interventions  Plan intubation. PCCM will consult   Intervention Category Major Interventions: Respiratory failure - evaluation and management  Shan Levans 06/12/2012, 8:14 PM

## 2012-06-12 NOTE — Progress Notes (Addendum)
Patient ID: April [name], female   DOB: [birth year], 77 y.o.   MRN: [id number]  [facility] PROGRESS NOTE  ULONDA KLOSOWSKI WUJ:[id number] DOB: 02/14/1935 DOA: 06/10/2012 PCP: No primary provider on file.  Brief narrative: Pt is 77 yo female who was admitted 08/05 by [facility] for acute onset of severe lower quadrants abdominal pain with vomiting. CT imaging showed perforated gastric ulcer and she is now status post exploratory laparotomy, post op day #2. Continues to be monitored in SDU, with NGT and JP drain in place.   Principal Problem:  *Perforated gastric ulcer - prepyloric gastric ulcer, pt is now status post ex lap, post op day #2 - per primary team - will continue to follow - H. Pylori results pending   Active Problems:  Hypomagnesemia - will continue to supplement and will check Mg level in AM   HTN (hypertension) - review of records indicate that pt is not on any home antihypertensive regimen - BP remain stable this AM and at target range  - will continue to monitor vitals per floor protocol   Left upper lobe nodule - pt will need CT chest follow up as was recommended on the imaging  - Imaging tests noted COPD and emphysema - pt is maintaining oxygen saturations > 93 % on 2 L  - will continue to monitor vitals per floor protocol   Leukocytosis - unclear etiology and 2 days post op - pt is afebrile but will continue to monitor vitals per floor protocol - UA obtained 08/06 and negative for an infectious process, urine culture pending  - will also check CBC in AM   DM (diabetes mellitus) - based on A1C of 5.3 this very well controlled - will continue to monitor CBG per floor protocol   Hypercalcemia - in the setting of primary hyperparathyroidism - calcium remain stable and is within normal limits - CBC in AM   CRF (chronic renal failure) - creatinine is up from yesterday, unclear etiology and could be pre renal in etiology  - this may improve as her hydration  status improves - will check urine sodium and urine creatinine - continue to monitor strict I's and O's - pt is maintaining good urine output - BMP in AM  Procedures/Studies: Dg Chest Port 1v Same Day 06/10/2012    IMPRESSION:  Low volume exam without acute process    Ct Angio Abd/pel W/ And/or W/o/Chest 06/10/2012  IMPRESSION:  Negative for thoracic aortic dissection.  Thoracic atherosclerosis evident.  COPD/emphysema  7 mm anterior left upper lobe indeterminate nodule. If the patient is at high risk for bronchogenic carcinoma, follow-up chest CT at 6- 12 months is recommended.   If the patient is at low risk for bronchogenic carcinoma, follow-up chest CT at 12 months is recommended.   IMPRESSION:  Pneumoperitoneum with wall thickening and edema as well as mucosal enhancement the stomach.   Perforated gastric ulcer could have this appearance versus other perforated bowel.   Wall thickening mucosal enhancement of the distal small bowel and portions of the colon compatible with enterocolitis without obstruction or pneumatosis.   Diffuse mesenteric congestion with abdominal and pelvic ascites    Antibiotics:  Zosyn 06/11/2012 -->  Blood culture 08/05 --> no growth to date  Urine Culture 08/05 --> pending  Code Status: Full Family Communication: Pt at bedside Disposition Plan: Continue to monitor in SDU.  HPI/Subjective: No events overnight. Pt reports feeling somewhat better but still reports abdominal pain.  Objective: Filed Vitals:  06/12/12 0500 06/12/12 0740 06/12/12 0800 06/12/12 1040  BP: 126/50 116/47  122/55  Pulse: 76 80  77  Temp:   97.8 F (36.6 C)   TempSrc:   Oral   Resp: 22 27  29   Height:      Weight:      SpO2: 94% 93%  95%    Intake/Output Summary (Last 24 hours) at 06/12/12 1157 Last data filed at 06/12/12 0800  Gross per 24 hour  Intake 2679.17 ml  Output   1585 ml  Net 1094.17 ml    Exam:   General:  Pt is awake but slightly confused this  AM, follows simple commands appropriately   Cardiovascular: Regular rate and rhythm, S1/S2, no murmurs, no rubs, no gallops  Respiratory: Slightly diminished breath sounds at bases, no wheezing, no rhonchi  Abdomen: Soft, tender mostly in epigastric area, JP drain in place with serosanguineous output , non distended, bowel sounds soft, no guarding, NGT in place  Extremities: No edema, pulses DP and PT palpable bilaterally  Data Reviewed: Basic Metabolic Panel:  Lab 06/12/12 1610 06/11/12 1155 06/11/12 0323 06/10/12 1755 06/10/12 1745  NA 143 145 143 145 140  K 4.2 4.6 3.8 3.5 3.4*  CL 118* 119* 115* 112 104  CO2 18* 20 19 -- 22  GLUCOSE 136* 96 97 265* 279*  BUN 27* 28* 28* 30* 27*  CREATININE 2.04* 2.01* 1.91* 1.70* 1.95*  CALCIUM 8.3* 8.8 9.1 -- 11.9*  MG 1.2* 1.2* -- -- --  PHOS -- -- -- -- --   Liver Function Tests:  Lab 06/12/12 0320 06/10/12 1745  AST 25 13  ALT 31 9  ALKPHOS 62 91  BILITOT 0.4 0.3  PROT 4.1* 6.4  ALBUMIN 1.7* 3.4*   CBC:  Lab 06/12/12 0320 06/11/12 1155 06/11/12 0323 06/10/12 1755 06/10/12 1745  WBC 13.3* 7.0 3.3* -- 8.4  NEUTROABS -- -- -- -- 5.1  HGB 11.0* 12.5 13.1 15.3* 15.1*  HCT 32.5* 36.6 39.0 45.0 44.7  MCV 89.8 89.3 91.1 -- 92.2  PLT 178 181 188 -- 275   CBG:  Lab 06/12/12 0338 06/11/12 2325 06/11/12 1949 06/11/12 1614 06/11/12 1114  GLUCAP 138* 137* 110* 75 97    Scheduled Meds:   . acetaminophen  1,000 mg Intravenous Q6H  . heparin  5,000 Units Subcutaneous Q8H  . insulin aspart  0-15 Units Subcutaneous Q4H  . magnesium sulfate 1 - 4 g bolus   1 g Intravenous Once  . pantoprazole (PROTONIX) IV  40 mg Intravenous Q12H  . ZOSYN  IV  3.375 g Intravenous Q8H   Continuous Infusions:   . sodium chloride 500 mL/hr at 06/11/12 1433  . dextrose 5 % and 0.9% NaCl 1,000 mL (06/12/12 0102)  . dextrose 5 % and 0.9% NaCl    . TPN (CLINIMIX) +/- additives    . fat emulsion       Debbora Presto, MD  [facility] Pager [phone]  If 7PM-7AM, please contact night-coverage [email or web address] Password [email or web address] 06/12/2012, 11:57 AM   LOS: 2 days

## 2012-06-13 ENCOUNTER — Inpatient Hospital Stay (HOSPITAL_COMMUNITY): Payer: PRIVATE HEALTH INSURANCE

## 2012-06-13 DIAGNOSIS — I10 Essential (primary) hypertension: Secondary | ICD-10-CM

## 2012-06-13 DIAGNOSIS — N189 Chronic kidney disease, unspecified: Secondary | ICD-10-CM

## 2012-06-13 DIAGNOSIS — J984 Other disorders of lung: Secondary | ICD-10-CM

## 2012-06-13 LAB — BASIC METABOLIC PANEL
BUN: 27 mg/dL — ABNORMAL HIGH (ref 6–23)
CO2: 19 mEq/L (ref 19–32)
Chloride: 116 mEq/L — ABNORMAL HIGH (ref 96–112)
GFR calc Af Amer: 26 mL/min — ABNORMAL LOW (ref >90)
Potassium: 3.5 mEq/L (ref 3.5–5.1)

## 2012-06-13 LAB — COMPREHENSIVE METABOLIC PANEL
AST: 19 U/L (ref 0–37)
AST: 19 U/L (ref 0–37)
Albumin: 1.7 g/dL — ABNORMAL LOW (ref 3.5–5.2)
Alkaline Phosphatase: 57 U/L (ref 39–117)
BUN: 26 mg/dL — ABNORMAL HIGH (ref 6–23)
CO2: 21 mEq/L (ref 19–32)
Calcium: 8.9 mg/dL (ref 8.4–10.5)
Chloride: 118 mEq/L — ABNORMAL HIGH (ref 96–112)
Creatinine, Ser: 2.19 mg/dL — ABNORMAL HIGH (ref 0.50–1.10)
GFR calc Af Amer: 24 mL/min — ABNORMAL LOW (ref >90)
GFR calc non Af Amer: 21 mL/min — ABNORMAL LOW (ref >90)
Potassium: 3.3 mEq/L — ABNORMAL LOW (ref 3.5–5.1)
Sodium: 146 mEq/L — ABNORMAL HIGH (ref 135–145)
Sodium: 147 mEq/L — ABNORMAL HIGH (ref 135–145)
Total Protein: 4.7 g/dL — ABNORMAL LOW (ref 6.0–8.3)
Total Protein: 4.6 g/dL — ABNORMAL LOW (ref 6.0–8.3)

## 2012-06-13 LAB — LACTIC ACID, PLASMA: Lactic Acid, Venous: 0.7 mmol/L (ref 0.5–2.2)

## 2012-06-13 LAB — TROPONIN I
Troponin I: <0.3 ng/mL (ref <0.30)
Troponin I: <0.3 ng/mL (ref <0.30)

## 2012-06-13 LAB — CBC
HCT: 31.4 % — ABNORMAL LOW (ref 36.0–46.0)
MCHC: 34.4 g/dL (ref 30.0–36.0)
Platelets: 169 10*3/uL (ref 150–400)
RDW: 13.9 % (ref 11.5–15.5)
WBC: 13.9 10*3/uL — ABNORMAL HIGH (ref 4.0–10.5)

## 2012-06-13 LAB — GLUCOSE, CAPILLARY
Glucose-Capillary: 107 mg/dL — ABNORMAL HIGH (ref 70–99)
Glucose-Capillary: 88 mg/dL (ref 70–99)
Glucose-Capillary: 102 mg/dL — ABNORMAL HIGH (ref 70–99)

## 2012-06-13 LAB — PREALBUMIN: Prealbumin: 10.3 mg/dL — ABNORMAL LOW (ref 17.0–34.0)

## 2012-06-13 LAB — DIFFERENTIAL
Basophils Absolute: 0 10*3/uL (ref 0.0–0.1)
Eosinophils Absolute: 0.1 10*3/uL (ref 0.0–0.7)
Eosinophils Relative: 0 % (ref 0–5)
Lymphocytes Relative: 8 % — ABNORMAL LOW (ref 12–46)
Neutrophils Relative %: 87 % — ABNORMAL HIGH (ref 43–77)

## 2012-06-13 LAB — CHOLESTEROL, TOTAL: Cholesterol: 54 mg/dL (ref 0–200)

## 2012-06-13 LAB — PRO B NATRIURETIC PEPTIDE: Pro B Natriuretic peptide (BNP): 463.8 pg/mL — ABNORMAL HIGH (ref 0–125)

## 2012-06-13 LAB — TRIGLYCERIDES: Triglycerides: 122 mg/dL (ref <150)

## 2012-06-13 MED ORDER — CLINIMIX E/DEXTROSE (5/20) 5 % IV SOLN
INTRAVENOUS | Status: AC
  Administered 2012-06-13: 18:00:00 via INTRAVENOUS
  Filled 2012-06-13: qty 1000

## 2012-06-13 MED ORDER — POTASSIUM CHLORIDE 10 MEQ/50ML IV SOLN
10.0000 meq | INTRAVENOUS | Status: AC
  Administered 2012-06-13 (×2): 10 meq via INTRAVENOUS
  Filled 2012-06-13 (×2): qty 50

## 2012-06-13 MED ORDER — PROPOFOL 10 MG/ML IV EMUL
5.0000 ug/kg/min | INTRAVENOUS | Status: DC
  Administered 2012-06-13: 30 ug/kg/min via INTRAVENOUS

## 2012-06-13 MED ORDER — BIOTENE DRY MOUTH MT LIQD
15.0000 mL | Freq: Four times a day (QID) | OROMUCOSAL | Status: DC
  Administered 2012-06-13 – 2012-06-24 (×34): 15 mL via OROMUCOSAL

## 2012-06-13 MED ORDER — PROPOFOL 10 MG/ML IV EMUL
INTRAVENOUS | Status: AC
  Filled 2012-06-13: qty 100

## 2012-06-13 NOTE — Progress Notes (Signed)
  Echocardiogram 2D Echocardiogram has been performed.  April [name] 06/13/2012, 11:49 AM

## 2012-06-13 NOTE — Progress Notes (Addendum)
3 Days Post-Op  Subjective: Patient apparently went into pulmonary edema last light and had to be intubated. She has been diuresed and now has been weaned down to FiO2 of 50% with SaO2 approximately 98%. She has been hemodynamically stable.assistance from Dr. Delford Field and critical care medicine service is greatly appreciated.  Currently sedated on Diprivan. Maximum temperature 100.2. Most recent blood pressure 110/51. Most recent heart rate 78.  Hemoglobin 10.8, WBC 13,900.potassium 3.3. Creatinine 2.1.  Objective: Vital signs in last 24 hours: Temp:  [97.8 F (36.6 C)-100.2 F (37.9 C)] 100.2 F (37.9 C) (08/08 0311) Pulse Rate:  [68-101] 78  (08/08 0311) Resp:  [25-33] 30  (08/08 0311) BP: (103-145)/(46-63) 110/51 mmHg (08/08 0311) SpO2:  [93 %-100 %] 95 % (08/08 0323) FiO2 (%):  [50 %-100 %] 50 % (08/08 0323) Weight:  [144 lb 13.5 oz (65.7 kg)] 144 lb 13.5 oz (65.7 kg) (08/07 2100) Last BM Date: 06/10/12  Intake/Output from previous day: 08/07 0701 - 08/08 0700 In: 2197.8 [I.V.:1562.8; IV Piggyback:285; TPN:350] Out: 2725 [Urine:2425; Emesis/NG output:225; Drains:75] Intake/Output this shift: Total I/O In: 697.8 [I.V.:62.8; IV Piggyback:285; TPN:350] Out: 1650 [Urine:1650]  General appearance: intubated. Does not respond to voice. Responds only to deep pain. Skin warm and dry. Resp: anterior exam of lungs reveals lungs are clear bilaterally. GI: abdomen is soft but tender to deep palpation. Midline wound clean and fascia intact no purulence. JP drainage is serous, nonpurulent. No enteric content.  Lab Results:  Results for orders placed during the hospital encounter of 06/10/12 (from the past 24 hour(s))  GLUCOSE, CAPILLARY     Status: Normal   Collection Time   06/12/12  8:02 AM      Component Value Range   Glucose-Capillary 89  70 - 99 mg/dL   Comment 1 Documented in Chart     Comment 2 Notify RN    GLUCOSE, CAPILLARY     Status: Normal   Collection Time   06/12/12 11:28  AM      Component Value Range   Glucose-Capillary 90  70 - 99 mg/dL   Comment 1 Documented in Chart     Comment 2 Notify RN    GLUCOSE, CAPILLARY     Status: Normal   Collection Time   06/12/12  5:47 PM      Component Value Range   Glucose-Capillary 81  70 - 99 mg/dL  CREATININE, URINE, RANDOM     Status: Normal   Collection Time   06/12/12  5:50 PM      Component Value Range   Creatinine, Urine 69.7    PROCALCITONIN     Status: Normal   Collection Time   06/12/12  9:37 PM      Component Value Range   Procalcitonin 60.31    TROPONIN I     Status: Normal   Collection Time   06/12/12  9:38 PM      Component Value Range   Troponin I <0.30  <0.30 ng/mL  LACTIC ACID, PLASMA     Status: Normal   Collection Time   06/12/12  9:38 PM      Component Value Range   Lactic Acid, Venous 0.7  0.5 - 2.2 mmol/L  BLOOD GAS, ARTERIAL     Status: Abnormal   Collection Time   06/12/12 10:04 PM      Component Value Range   FIO2 1.00     Delivery systems VENTILATOR     Mode PRESSURE REGULATED VOLUME CONTROL  VT 400     Rate 20     Peep/cpap 8.0     pH, Arterial 7.390  7.350 - 7.450   pCO2 arterial 30.1 (*) 35.0 - 45.0 mmHg   pO2, Arterial 89.0  80.0 - 100.0 mmHg   Bicarbonate 17.8 (*) 20.0 - 24.0 mEq/L   TCO2 16.0  0 - 100 mmol/L   Acid-base deficit 5.6 (*) 0.0 - 2.0 mmol/L   O2 Saturation 96.9     Patient temperature 37.0     Collection site RIGHT RADIAL     Drawn by [id number]     Sample type ARTERIAL DRAW     Allens test (pass/fail) PASS  PASS  CBC     Status: Abnormal   Collection Time   06/13/12  4:50 AM      Component Value Range   WBC 13.9 (*) 4.0 - 10.5 K/uL   RBC 3.50 (*) 3.87 - 5.11 MIL/uL   Hemoglobin 10.8 (*) 12.0 - 15.0 g/dL   HCT 04.5 (*) 40.9 - 81.1 %   MCV 89.7  78.0 - 100.0 fL   MCH 30.9  26.0 - 34.0 pg   MCHC 34.4  30.0 - 36.0 g/dL   RDW 91.4  78.2 - 95.6 %   Platelets 169  150 - 400 K/uL  COMPREHENSIVE METABOLIC PANEL     Status: Abnormal   Collection Time   06/13/12   4:50 AM      Component Value Range   Sodium 146 (*) 135 - 145 mEq/L   Potassium 3.3 (*) 3.5 - 5.1 mEq/L   Chloride 118 (*) 96 - 112 mEq/L   CO2 21  19 - 32 mEq/L   Glucose, Bld 79  70 - 99 mg/dL   BUN 26 (*) 6 - 23 mg/dL   Creatinine, Ser 2.13 (*) 0.50 - 1.10 mg/dL   Calcium 8.6  8.4 - 08.6 mg/dL   Total Protein 4.7 (*) 6.0 - 8.3 g/dL   Albumin 1.7 (*) 3.5 - 5.2 g/dL   AST 19  0 - 37 U/L   ALT 24  0 - 35 U/L   Alkaline Phosphatase 57  39 - 117 U/L   Total Bilirubin 0.3  0.3 - 1.2 mg/dL   GFR calc non Af Amer 22 (*) >90 mL/min   GFR calc Af Amer 26 (*) >90 mL/min  MAGNESIUM     Status: Normal   Collection Time   06/13/12  4:50 AM      Component Value Range   Magnesium 1.8  1.5 - 2.5 mg/dL  PHOSPHORUS     Status: Normal   Collection Time   06/13/12  4:50 AM      Component Value Range   Phosphorus 2.8  2.3 - 4.6 mg/dL  CHOLESTEROL, TOTAL     Status: Normal   Collection Time   06/13/12  4:50 AM      Component Value Range   Cholesterol 54  0 - 200 mg/dL  TRIGLYCERIDES     Status: Normal   Collection Time   06/13/12  4:50 AM      Component Value Range   Triglycerides 122  <150 mg/dL  DIFFERENTIAL     Status: Abnormal   Collection Time   06/13/12  4:50 AM      Component Value Range   Neutrophils Relative 87 (*) 43 - 77 %   Neutro Abs 12.0 (*) 1.7 - 7.7 K/uL   Lymphocytes Relative 8 (*) 12 - 46 %  Lymphs Abs 1.2  0.7 - 4.0 K/uL   Monocytes Relative 5  3 - 12 %   Monocytes Absolute 0.6  0.1 - 1.0 K/uL   Eosinophils Relative 0  0 - 5 %   Eosinophils Absolute 0.1  0.0 - 0.7 K/uL   Basophils Relative 0  0 - 1 %   Basophils Absolute 0.0  0.0 - 0.1 K/uL  TROPONIN I     Status: Normal   Collection Time   06/13/12  4:50 AM      Component Value Range   Troponin I <0.30  <0.30 ng/mL  LACTIC ACID, PLASMA     Status: Normal   Collection Time   06/13/12  4:50 AM      Component Value Range   Lactic Acid, Venous 0.7  0.5 - 2.2 mmol/L     Studies/Results: @RISRSLT24 @     .  albuterol-ipratropium  6 puff Inhalation Q4H  . antiseptic oral rinse  15 mL Mouth Rinse BID  . antiseptic oral rinse  15 mL Mouth Rinse QID  . chlorhexidine  15 mL Mouth Rinse BID  . etomidate      . furosemide  40 mg Intravenous Once  . heparin  5,000 Units Subcutaneous Q8H  . insulin aspart  0-15 Units Subcutaneous Q4H  . lidocaine (cardiac) 100 mg/2ml      . magnesium sulfate 1 - 4 g bolus IVPB  1 g Intravenous Once  . magnesium sulfate 1 - 4 g bolus IVPB  1 g Intravenous Once  . pantoprazole (PROTONIX) IV  40 mg Intravenous Q12H  . piperacillin-tazobactam (ZOSYN)  IV  3.375 g Intravenous Q8H  . propofol      . propofol      . rocuronium      . sodium chloride  10-40 mL Intracatheter Q12H  . succinylcholine      . vancomycin  500 mg Intravenous Q24H  . vancomycin  750 mg Intravenous Once  . DISCONTD: furosemide  40 mg Intravenous Once     Assessment/Plan: s/p Procedure(s): EXPLORATORY LAPAROTOMY   2 Days Post-Op  Subjective:  Alert. Pictures questions appropriately but is a little bit disoriented and think she is at home. Urine output better. Blood pressure better. Stable. No bleeding.  Appreciate Advice and management by [facility].  Hemoglobin stable, 11.0. WBC 13,300. Creatinine 2.4, BUN 27. Magnesium 1.2, being treated and replaced.CBG's 75-138 range.  Objective:  Vital signs in last 24 hours:  Temp: [97.4 F (36.3 C)-98.8 F (37.1 C)] 98.3 F (36.8 C) (08/07 0000)  Pulse Rate: [73-88] 76 (08/07 0500)  Resp: [21-29] 22 (08/07 0500)  BP: (80-126)/(40-54) 126/50 mmHg (08/07 0500)  SpO2: [94 %-99 %] 94 % (08/07 0500)  Last BM Date: 06/10/12  Intake/Output from previous day:  08/06 0701 - 08/07 0700  In: 3041.7 [I.V.:2375; IV Piggyback:666.7]  Out: 1615 [Urine:1145; Emesis/NG output:400; Drains:70]  Intake/Output this shift:  Total I/O  In: 1287.5 [I.V.:1125; IV Piggyback:162.5]  Out: 1055 [Urine:585; Emesis/NG output:400; Drains:70]  General  appearance: elderly. Appears deconditioned. Awake and alert. Mild confusion. No distress. Skin warm and dry.  Resp: clear to auscultation bilaterally  GI: abdomen is soft, appropriately sore. Midline wound with skin left open is clean. No bowel sounds. JP draining serosanguineous.  Lab Results:    Results for orders placed during the hospital encounter of 06/10/12 (from the past 24 hour(s))    GLUCOSE, CAPILLARY Status: Abnormal     Collection Time     06/11/12  8:01 AM    Component  Value  Range     Glucose-Capillary  108 (*)  70 - 99 mg/dL     Comment 1  Documented in Chart      Comment 2  Notify RN     GLUCOSE, CAPILLARY Status: Normal     Collection Time     06/11/12 11:14 AM    Component  Value  Range     Glucose-Capillary  97  70 - 99 mg/dL     Comment 1  Documented in Chart      Comment 2  Notify RN     CBC Status: Normal     Collection Time     06/11/12 11:55 AM    Component  Value  Range     WBC  7.0  4.0 - 10.5 K/uL     RBC  4.10  3.87 - 5.11 MIL/uL     Hemoglobin  12.5  12.0 - 15.0 g/dL     HCT  16.1  09.6 - 04.5 %     MCV  89.3  78.0 - 100.0 fL     MCH  30.5  26.0 - 34.0 pg     MCHC  34.2  30.0 - 36.0 g/dL     RDW  40.9  81.1 - 91.4 %     Platelets  181  150 - 400 K/uL    BASIC METABOLIC PANEL Status: Abnormal     Collection Time     06/11/12 11:55 AM    Component  Value  Range     Sodium  145  135 - 145 mEq/L     Potassium  4.6  3.5 - 5.1 mEq/L     Chloride  119 (*)  96 - 112 mEq/L     CO2  20  19 - 32 mEq/L     Glucose, Bld  96  70 - 99 mg/dL     BUN  28 (*)  6 - 23 mg/dL     Creatinine, Ser  7.82 (*)  0.50 - 1.10 mg/dL     Calcium  8.8  8.4 - 10.5 mg/dL     GFR calc non Af Amer  23 (*)  >90 mL/min     GFR calc Af Amer  27 (*)  >90 mL/min    MAGNESIUM Status: Abnormal     Collection Time     06/11/12 11:55 AM    Component  Value  Range     Magnesium  1.2 (*)  1.5 - 2.5 mg/dL    URINALYSIS, ROUTINE W REFLEX MICROSCOPIC Status: Abnormal     Collection Time      06/11/12 3:47 PM    Component  Value  Range     Color, Urine  YELLOW  YELLOW     APPearance  CLOUDY (*)  CLEAR     Specific Gravity, Urine  1.033 (*)  1.005 - 1.030     pH  5.0  5.0 - 8.0     Glucose, UA  NEGATIVE  NEGATIVE mg/dL     Hgb urine dipstick  LARGE (*)  NEGATIVE     Bilirubin Urine  NEGATIVE  NEGATIVE     Ketones, ur  NEGATIVE  NEGATIVE mg/dL     Protein, ur  30 (*)  NEGATIVE mg/dL     Urobilinogen, UA  1.0  0.0 - 1.0 mg/dL     Nitrite  NEGATIVE  NEGATIVE     Leukocytes, UA  NEGATIVE  NEGATIVE    SODIUM, URINE, RANDOM Status: Normal     Collection Time     06/11/12 3:47 PM    Component  Value  Range     Sodium, Ur  62     CREATININE, URINE, RANDOM Status: Normal     Collection Time     06/11/12 3:47 PM    Component  Value  Range     Creatinine, Urine  60.61     URINE MICROSCOPIC-ADD ON Status: Abnormal     Collection Time     06/11/12 3:47 PM    Component  Value  Range     Squamous Epithelial / LPF  FEW (*)  RARE     Bacteria, UA  RARE  RARE     Casts  GRANULAR CAST (*)  NEGATIVE     Urine-Other  AMORPHOUS URATES/PHOSPHATES     GLUCOSE, CAPILLARY Status: Normal     Collection Time     06/11/12 4:14 PM    Component  Value  Range     Glucose-Capillary  75  70 - 99 mg/dL     Comment 1  Documented in Chart      Comment 2  Notify RN     GLUCOSE, CAPILLARY Status: Abnormal     Collection Time     06/11/12 7:49 PM    Component  Value  Range     Glucose-Capillary  110 (*)  70 - 99 mg/dL     Comment 1  Documented in Chart      Comment 2  Notify RN     GLUCOSE, CAPILLARY Status: Abnormal     Collection Time     06/11/12 11:25 PM    Component  Value  Range     Glucose-Capillary  137 (*)  70 - 99 mg/dL     Comment 1  Notify RN     CBC Status: Abnormal     Collection Time     06/12/12 3:20 AM    Component  Value  Range     WBC  13.3 (*)  4.0 - 10.5 K/uL     RBC  3.62 (*)  3.87 - 5.11 MIL/uL     Hemoglobin  11.0 (*)  12.0 - 15.0 g/dL     HCT  16.1 (*)  09.6 - 46.0 %     MCV   89.8  78.0 - 100.0 fL     MCH  30.4  26.0 - 34.0 pg     MCHC  33.8  30.0 - 36.0 g/dL     RDW  04.5  40.9 - 81.1 %     Platelets  178  150 - 400 K/uL    MAGNESIUM Status: Abnormal     Collection Time     06/12/12 3:20 AM    Component  Value  Range     Magnesium  1.2 (*)  1.5 - 2.5 mg/dL    COMPREHENSIVE METABOLIC PANEL Status: Abnormal     Collection Time     06/12/12 3:20 AM    Component  Value  Range     Sodium  143  135 - 145 mEq/L     Potassium  4.2  3.5 - 5.1 mEq/L     Chloride  118 (*)  96 - 112 mEq/L     CO2  18 (*)  19 - 32 mEq/L     Glucose, Bld  136 (*)  70 - 99 mg/dL  BUN  27 (*)  6 - 23 mg/dL     Creatinine, Ser  1.61 (*)  0.50 - 1.10 mg/dL     Calcium  8.3 (*)  8.4 - 10.5 mg/dL     Total Protein  4.1 (*)  6.0 - 8.3 g/dL     Albumin  1.7 (*)  3.5 - 5.2 g/dL     AST  25  0 - 37 U/L     ALT  31  0 - 35 U/L     Alkaline Phosphatase  62  39 - 117 U/L     Total Bilirubin  0.4  0.3 - 1.2 mg/dL     GFR calc non Af Amer  23 (*)  >90 mL/min     GFR calc Af Amer  26 (*)  >90 mL/min    GLUCOSE, CAPILLARY Status: Abnormal     Collection Time     06/12/12 3:38 AM    Component  Value  Range     Glucose-Capillary  138 (*)  70 - 99 mg/dL     Comment 1  Notify RN      Studies/Results:  @RISRSLT24 @     .  acetaminophen  1,000 mg  Intravenous  Q6H    .  antiseptic oral rinse  15 mL  Mouth Rinse  BID    .  heparin  5,000 Units  Subcutaneous  Q8H    .  insulin aspart  0-15 Units  Subcutaneous  Q4H    .  magnesium sulfate 1 - 4 g bolus IVPB  1 g  Intravenous  Once    .  pantoprazole (PROTONIX) IV  40 mg  Intravenous  Q12H    .  piperacillin-tazobactam (ZOSYN) IV  3.375 g  Intravenous  Q8H    .  sodium chloride  500 mL  Intravenous  Once    .  sodium chloride  500 mL  Intravenous  Once    .  DISCONTD: magnesium sulfate LVP 250-500 ml  1 g  Intravenous  Once    .  DISCONTD: magnesium sulfate LVP 250-500 ml  1 g  Intravenous  Once    .  DISCONTD: magnesium sulfate LVP 250-500 ml  2  g  Intravenous  Once    .  DISCONTD: piperacillin-tazobactam (ZOSYN) IV  3.375 g  Intravenous  Q8H     Assessment/Plan:  s/p Procedure(s):      POD #3. Closure and omental patch of perforated prepyloric ulcer. Continue NG tube and bowel rest. PICC line has been inserted and TNA has been started. Continue double dose proton pump inhibitors and intravenous antibiotics. Check H. Pylori. Plan Gastrografin upper GI in a few days before removing the NG tube.  Ventilator-dependent respiratory failure. Sounds like sudden onset pulmonary edema which has improved with diuresis. Hopefully she will be weanable in the next 24-48 hours.  Protein calorie malnutrition. Started on TNA  Diabetes mellitus. On sliding scale insulin  Chronic kidney disease, nonoliguric. Stable.    LOS: 3 days    Korie Brabson M. Derrell Lolling, M.D., [facility] General and Minimally invasive Surgery Breast and Colorectal Surgery Office:   [phone] Pager:   [phone]  06/13/2012  . .prob

## 2012-06-13 NOTE — Progress Notes (Signed)
Will not remove foley due to the need to strictly monitor urinary output and perioperative dx.

## 2012-06-13 NOTE — Progress Notes (Signed)
Name: April [name] MRN: [id number] DOB: 15-Jun-1935    LOS: 3  Referring Provider:  CCS Reason for Referral:  Acute respiratory failure  PULMONARY / CRITICAL CARE MEDICINE   Brief patient description:  77 yo with prob COPD admitted on 8/5 with gastric ulcer perforation s/p exploratory lap with ulcer repair.  Developed respiratory distress on 8/7.  Intubated.  Events Since Admission: 8/5  Admitted with gastric ulcer perforation, exploratory lap with ulcer repair 8/7  Developed respiratory distress, intubated  Current Status: Looks comfortable on SBT, no distress.   Vital Signs: Temp:  [97.8 F (36.6 C)-100.2 F (37.9 C)] 99.9 F (37.7 C) (08/08 0900) Pulse Rate:  [68-101] 88  (08/08 0900) Resp:  [25-33] 32  (08/08 0900) BP: (103-145)/(46-63) 122/48 mmHg (08/08 0900) SpO2:  [95 %-100 %] 97 % (08/08 0900) FiO2 (%):  [40 %-100 %] 40 % (08/08 1005) Weight:  [65.7 kg (144 lb 13.5 oz)] 65.7 kg (144 lb 13.5 oz) (08/07 2100)  Intake/Output Summary (Last 24 hours) at 06/13/12 1142 Last data filed at 06/13/12 1100  Gross per 24 hour  Intake 2395.8 ml  Output   3245 ml  Net -849.2 ml    Physical Examination: General:  Mechanically ventilated, synchronous Neuro:  Sedated, opens eyes, follows commands HEENT:  NGT, OETT Neck:  No JVD Cardiovascular:  RRR, tachycardic, no murmurs Lungs:  Bilateral diminished air entry, scattered rhonchi / rales Abdomen:  Soft, bowel sounds absent, surgical dressing intact, drain intact Musculoskeletal:  No edema Skin:  Intact  Principal Problem:  *Perforated gastric ulcer Active Problems:  HTN (hypertension)  DM (diabetes mellitus)  Hypercalcemia  CRF (chronic renal failure)  Lung nodule  Hypomagnesemia  Leukocytosis  COPD (chronic obstructive pulmonary disease)  Acute respiratory failure with hypoxia  Acute encephalopathy  Aspiration pneumonia  ASSESSMENT AND PLAN  PULMONARY  Lab 06/12/12 2204  PHART 7.390  PCO2ART 30.1*    PO2ART 89.0  HCO3 17.8*  O2SAT 96.9    Ventilator Settings: Vent Mode:  [-] CPAP;PSV FiO2 (%):  [40 %-100 %] 40 % Set Rate:  [20 bmp] 20 bmp Vt Set:  [400 mL] 400 mL PEEP:  [5 cmH20-8 cmH20] 5 cmH20 Plateau Pressure:  [21 cmH20-24 cmH20] 21 cmH20  CXR:  8/7 >>> Hardware good position, bibasilar airspace disease R>L 8/8 w/out sig change in basilar R>L airspace disease/effusion ETT:  8/7 >>>  A:  Acute hypoxemic respiratory failure. Post-op and in setting of R>L infiltrates. Diff dx: negative pressure pulmonary edema vs aspiration vs basilar atx. C/b prob element  COPD, no clear evidence of exacerbation. Got lasix during PM hours 8/7. Vent mechanics are reassuring, initially passed SBT but then developed some paradoxical efforts.  P:   Cont PSV, eval CXR Work towards extubation, goal for 8/8 Scheduled BDs Check BNP and echo Check CVP  CARDIOVASCULAR  Lab 06/13/12 0857 06/13/12 0856 06/13/12 0450 06/12/12 2138 06/10/12 1810  TROPONINI -- <0.30 <0.30 <0.30 --  LATICACIDVEN 0.8 -- 0.7 0.7 4.0*  PROBNP -- -- -- -- --   ECG:  8/6 >>> NSR, anterolateral ischemia? Lines: R PICC 8/6 >>> Echo 8/8>>>  A: Hemodynamically stable.    Elevated lactate on admission.  CEs all negative. History of dyslipidemia. P:  Holding preadmission ASA, Lipitor 2 d echo  RENAL  Lab 06/13/12 0450 06/12/12 0320 06/11/12 1155 06/11/12 0323 06/10/12 1755 06/10/12 1745  NA 146* 143 145 143 145 --  K 3.3* 4.2 -- -- -- --  CL 118* 118* 119*  115* 112 --  CO2 21 18* 20 19 -- 22  BUN 26* 27* 28* 28* 30* --  CREATININE 2.10* 2.04* 2.01* 1.91* 1.70* --  CALCIUM 8.6 8.3* 8.8 9.1 -- 11.9*  MG 1.8 1.2* 1.2* -- -- --  PHOS 2.8 -- -- -- -- --   Intake/Output      08/07 0701 - 08/08 0700 08/08 0701 - 08/09 0700   I.V. (mL/kg) 1594.4 (24.3) 3.9 (0.1)   IV Piggyback 297.5    TPN 550 100   Total Intake(mL/kg) 2441.9 (37.2) 103.9 (1.6)   Urine (mL/kg/hr) 2850 (1.8) 175   Emesis/NG output 225 200   Drains  95 20   Total Output 3170 395   Net -728.1 -291.1         Foley:  8/5 >>>  A:  Suspected acute on chronic renal failure. Had been seeing nephrology in out-pt setting. Got lasix during the evening 8/7. Not sure what BL Scr is, looks like it is in the 1.9 range.  P: Check chemistry  Keep even at this point  GASTROINTESTINAL  Lab 06/13/12 0450 06/12/12 0320 06/10/12 1745  AST 19 25 13   ALT 24 31 9   ALKPHOS 57 62 91  BILITOT 0.3 0.4 0.3  PROT 4.7* 4.1* 6.4  ALBUMIN 1.7* 1.7* 3.4*   A:  Perforated gastric ulcer s/p repair. P:   Per Surgery NPO NG to Sx TNA  HEMATOLOGIC  Lab 06/13/12 0450 06/12/12 0320 06/11/12 1155 06/11/12 0323 06/10/12 1755 06/10/12 1745  HGB 10.8* 11.0* 12.5 13.1 15.3* --  HCT 31.4* 32.5* 36.6 39.0 45.0 --  PLT 169 178 181 188 -- 275  INR -- -- -- -- -- 0.93  APTT -- -- -- -- -- --   A:  Mild postop anemia. P:  Trend CBC  INFECTIOUS  Lab 06/13/12 0450 06/12/12 2137 06/12/12 0320 06/11/12 1155 06/11/12 0323 06/10/12 1745  WBC 13.9* -- 13.3* 7.0 3.3* 8.4  PROCALCITON -- 60.31 -- -- -- --   Cultures: 8/5  Blood >>>  8/7  Respiratory >>>  Antibiotics: 8/6  Zosyn >>> 8/7  Vancomycin >>>  A:  Peritonitis.  Suspected aspiration pneumonia. P:   Added Vancomycin 8/7 PCT  ENDOCRINE  Lab 06/13/12 0801 06/13/12 0328 06/12/12 2348 06/12/12 1747 06/12/12 1128  GLUCAP 107* 82 88 81 90   A:  Normoglycemic.  DM.  History of hyperthyroidism, not on treatment. P:   Holding preadmission Janumet  NEUROLOGIC  A:  Acute encephalopathy (hypoxia, sedation).  Postop pain. P:   PRN analgesia Stop diprivan if we extubate.    BEST PRACTICE / DISPOSITION Level of Care:  ICU Primary Service:  CCS Consultants:  PCCM Code Status:  Full Diet:  NPO / TNA DVT Px:  Heparin GI Px:  Protonix Skin Integrity:  Intact Social / Family:  Updated at bedside  The patient is critically ill with multiple organ systems failure and requires high complexity  decision making for assessment and support, frequent evaluation and titration of therapies, application of advanced monitoring technologies and extensive interpretation of multiple databases. Critical Care Time devoted to patient care services described in this note is 45 minutes.  BABCOCK,PETE, 06/13/2012, 10:37 AM   Attending Addendum:  I have seen the patient, discussed the issues, test results and plans with Kreg Shropshire, NP. I agree with the Assessment and Plans as ammended above.   Levy Pupa, MD, PhD 06/13/2012, 11:46 AM [facility] [phone] or if no answer [phone]

## 2012-06-13 NOTE — Progress Notes (Signed)
PARENTERAL NUTRITION CONSULT NOTE - Follow up  Pharmacy Consult for TNA Indication: s/p gastric surgery, PCM.  No Known Allergies  Patient Measurements: Height: 5\' 2"  (157.5 cm) Weight: 144 lb 13.5 oz (65.7 kg) IBW/kg (Calculated) : 50.1   Vital Signs: Temp: 100 F (37.8 C) (08/08 0600) Temp src: Oral (08/08 0000) BP: 120/53 mmHg (08/08 0600) Pulse Rate: 72  (08/08 0600) Intake/Output from previous day: 08/07 0701 - 08/08 0700 In: 2268.2 [I.V.:1570.7; IV Piggyback:297.5; TPN:400] Out: 3020 [Urine:2700; Emesis/NG output:225; Drains:95] Intake/Output from this shift:    Labs:  [facility] 06/13/12 0450 06/12/12 0320 06/11/12 1155 06/10/12 1745  WBC 13.9* 13.3* 7.0 --  HGB 10.8* 11.0* 12.5 --  HCT 31.4* 32.5* 36.6 --  PLT 169 178 181 --  APTT -- -- -- --  INR -- -- -- 0.93    Basename 06/13/12 0450 06/12/12 1750 06/12/12 0320 06/11/12 1547 06/11/12 1155 06/10/12 1745  NA 146* -- 143 -- 145 --  K 3.3* -- 4.2 -- 4.6 --  CL 118* -- 118* -- 119* --  CO2 21 -- 18* -- 20 --  GLUCOSE 79 -- 136* -- 96 --  BUN 26* -- 27* -- 28* --  CREATININE 2.10* -- 2.04* -- 2.01* --  LABCREA -- 69.7 -- 60.61 -- --  CREAT24HRUR -- -- -- -- -- --  CALCIUM 8.6 -- 8.3* -- 8.8 --  MG 1.8 -- 1.2* -- 1.2* --  PHOS 2.8 -- -- -- -- --  PROT 4.7* -- 4.1* -- -- 6.4  ALBUMIN 1.7* -- 1.7* -- -- 3.4*  AST 19 -- 25 -- -- 13  ALT 24 -- 31 -- -- 9  ALKPHOS 57 -- 62 -- -- 91  BILITOT 0.3 -- 0.4 -- -- 0.3  BILIDIR -- -- -- -- -- --  IBILI -- -- -- -- -- --  PREALBUMIN -- -- -- -- -- --  TRIG 122 -- -- -- -- --  CHOLHDL -- -- -- -- -- --  CHOL 54 -- -- -- -- --   Estimated Creatinine Clearance: 20.9 ml/min (by C-G formula based on Cr of 2.1).    Basename 06/12/12 1747 06/12/12 1128 06/12/12 0802  GLUCAP 81 90 89    Medical History: Past Medical History  Diagnosis Date  . Diabetes mellitus   . Hyperthyroidism   . Hypertension     Medications:  Scheduled:     . albuterol-ipratropium  6  puff Inhalation Q4H  . antiseptic oral rinse  15 mL Mouth Rinse BID  . antiseptic oral rinse  15 mL Mouth Rinse QID  . chlorhexidine  15 mL Mouth Rinse BID  . etomidate      . furosemide  40 mg Intravenous Once  . heparin  5,000 Units Subcutaneous Q8H  . insulin aspart  0-15 Units Subcutaneous Q4H  . lidocaine (cardiac) 100 mg/34ml      . magnesium sulfate 1 - 4 g bolus IVPB  1 g Intravenous Once  . magnesium sulfate 1 - 4 g bolus IVPB  1 g Intravenous Once  . pantoprazole (PROTONIX) IV  40 mg Intravenous Q12H  . piperacillin-tazobactam (ZOSYN)  IV  3.375 g Intravenous Q8H  . propofol      . propofol      . rocuronium      . sodium chloride  10-40 mL Intracatheter Q12H  . succinylcholine      . vancomycin  500 mg Intravenous Q24H  . vancomycin  750 mg Intravenous Once  .  DISCONTD: furosemide  40 mg Intravenous Once   Nutritional Goals: per RD - 1250-1550Kcal/d and protein 69-82g/d. Clinimix E 5/20 at goal rate 40ml/hr + lipids at 32ml/hr on MWF will provide:  72/day protein and 1746Kcal/day MWF, 1267Kcal/day STTHS(Avg. 1473Kcal/day weekly).  Current nutrition: NPO  CBGs & Insulin requirements past 24 hours: 79-90. Moderate SSI q4h.  IVF: D5NS at [facility].  Labs:   Electrolytes - Potassium low. Sodium slightly high. Phos wnl. Mag improved to nml after 2g yesterday. Corr Ca 10.4.   SCr remains elevated.   LFTs - wnl  TGs - wnl  Prealbumin - in process  Assessment:  [name] s/p repair of perforated prepyloric ulcer on 06/10/12. TNA started d/t need for bowel rest.  Required intubation 06/12/12 d/t pulmonary edema. Sedated with propofol(provides 1.1 Kcal/ml), currently at ~23ml/hr(209Kcal/d).  Nutritional goals adjusted for renal insufficiency and to provide less fluid.  Plan:  Change to Clinimix E 5/20, keep at 32ml/hr for now.  Fat emulsion at 72ml/hr(MWF only due to ongoing shortage). Monitor propofol requirements.  Plan to advance as tolerated to a goal rate of 34ml/hr +  39ml/hr to provide.  TNA to contain standard multivitamins and trace elements(MWF only due to ongoing shortage).  Give KCl IV today. Caution with elevated SCr.   TNA lab panels on Mondays & Thursdays.  Check cmet, mag, phos in am.  Charolotte Eke, PharmD, pager [phone]. 06/13/2012,7:21 AM.

## 2012-06-14 ENCOUNTER — Inpatient Hospital Stay (HOSPITAL_COMMUNITY): Payer: PRIVATE HEALTH INSURANCE

## 2012-06-14 ENCOUNTER — Encounter (HOSPITAL_COMMUNITY): Payer: Self-pay | Admitting: Anesthesiology

## 2012-06-14 LAB — COMPREHENSIVE METABOLIC PANEL
Alkaline Phosphatase: 106 U/L (ref 39–117)
BUN: 27 mg/dL — ABNORMAL HIGH (ref 6–23)
Calcium: 9.3 mg/dL (ref 8.4–10.5)
GFR calc Af Amer: 28 mL/min — ABNORMAL LOW (ref >90)
Glucose, Bld: 138 mg/dL — ABNORMAL HIGH (ref 70–99)
Total Protein: 4.7 g/dL — ABNORMAL LOW (ref 6.0–8.3)

## 2012-06-14 LAB — GLUCOSE, CAPILLARY
Glucose-Capillary: 134 mg/dL — ABNORMAL HIGH (ref 70–99)
Glucose-Capillary: 168 mg/dL — ABNORMAL HIGH (ref 70–99)

## 2012-06-14 LAB — CBC
MCH: 30.4 pg (ref 26.0–34.0)
Platelets: 153 10*3/uL (ref 150–400)
RBC: 3.13 MIL/uL — ABNORMAL LOW (ref 3.87–5.11)
RDW: 14.4 % (ref 11.5–15.5)

## 2012-06-14 LAB — MAGNESIUM: Magnesium: 1.7 mg/dL (ref 1.5–2.5)

## 2012-06-14 LAB — PROCALCITONIN: Procalcitonin: 25.6 ng/mL

## 2012-06-14 MED ORDER — FAT EMULSION 20 % IV EMUL
240.0000 mL | INTRAVENOUS | Status: AC
  Administered 2012-06-14: 240 mL via INTRAVENOUS
  Filled 2012-06-14: qty 250

## 2012-06-14 MED ORDER — FUROSEMIDE 10 MG/ML IJ SOLN
40.0000 mg | Freq: Once | INTRAMUSCULAR | Status: AC
  Administered 2012-06-14: 40 mg via INTRAVENOUS
  Filled 2012-06-14: qty 4

## 2012-06-14 MED ORDER — SUCCINYLCHOLINE CHLORIDE 20 MG/ML IJ SOLN
INTRAMUSCULAR | Status: AC
  Filled 2012-06-14: qty 10

## 2012-06-14 MED ORDER — SUCCINYLCHOLINE CHLORIDE 20 MG/ML IJ SOLN
INTRAMUSCULAR | Status: DC | PRN
  Administered 2012-06-14: 80 mg via INTRAVENOUS

## 2012-06-14 MED ORDER — ETOMIDATE 2 MG/ML IV SOLN
INTRAVENOUS | Status: AC
  Filled 2012-06-14: qty 20

## 2012-06-14 MED ORDER — LIDOCAINE HCL (CARDIAC) 20 MG/ML IV SOLN
INTRAVENOUS | Status: AC
  Filled 2012-06-14: qty 5

## 2012-06-14 MED ORDER — MIDAZOLAM BOLUS VIA INFUSION
1.0000 mg | INTRAVENOUS | Status: DC | PRN
  Filled 2012-06-14: qty 2

## 2012-06-14 MED ORDER — FENTANYL CITRATE 0.05 MG/ML IJ SOLN
12.5000 ug | INTRAMUSCULAR | Status: DC | PRN
  Administered 2012-06-14 (×3): 25 ug via INTRAVENOUS
  Filled 2012-06-14 (×3): qty 2

## 2012-06-14 MED ORDER — MIDAZOLAM HCL 5 MG/ML IJ SOLN
2.0000 mg/h | INTRAMUSCULAR | Status: DC
  Administered 2012-06-14 – 2012-06-15 (×2): 2 mg/h via INTRAVENOUS
  Administered 2012-06-16: 3 mg/h via INTRAVENOUS
  Filled 2012-06-14 (×4): qty 10

## 2012-06-14 MED ORDER — PROPOFOL 10 MG/ML IV BOLUS
INTRAVENOUS | Status: DC | PRN
  Administered 2012-06-14: 100 mg via INTRAVENOUS

## 2012-06-14 MED ORDER — FENTANYL BOLUS VIA INFUSION
50.0000 ug | Freq: Four times a day (QID) | INTRAVENOUS | Status: DC | PRN
  Filled 2012-06-14: qty 100

## 2012-06-14 MED ORDER — ZINC TRACE METAL 1 MG/ML IV SOLN
INTRAVENOUS | Status: AC
  Administered 2012-06-14: 18:00:00 via INTRAVENOUS
  Filled 2012-06-14: qty 2000

## 2012-06-14 MED ORDER — ROCURONIUM BROMIDE 50 MG/5ML IV SOLN
INTRAVENOUS | Status: AC
  Filled 2012-06-14: qty 2

## 2012-06-14 MED ORDER — ALBUTEROL SULFATE (5 MG/ML) 0.5% IN NEBU
2.5000 mg | INHALATION_SOLUTION | RESPIRATORY_TRACT | Status: DC
  Administered 2012-06-14 – 2012-06-15 (×5): 2.5 mg via RESPIRATORY_TRACT
  Filled 2012-06-14 (×5): qty 0.5

## 2012-06-14 MED ORDER — IPRATROPIUM BROMIDE 0.02 % IN SOLN
0.5000 mg | RESPIRATORY_TRACT | Status: DC
  Administered 2012-06-14 – 2012-06-15 (×5): 0.5 mg via RESPIRATORY_TRACT
  Filled 2012-06-14 (×5): qty 2.5

## 2012-06-14 MED ORDER — SODIUM CHLORIDE 0.9 % IV SOLN
50.0000 ug/h | INTRAVENOUS | Status: DC
  Administered 2012-06-14: 50 ug/h via INTRAVENOUS
  Filled 2012-06-14: qty 50

## 2012-06-14 MED ORDER — POTASSIUM CHLORIDE 10 MEQ/100ML IV SOLN
10.0000 meq | INTRAVENOUS | Status: AC
  Administered 2012-06-14 (×6): 10 meq via INTRAVENOUS
  Filled 2012-06-14 (×6): qty 100

## 2012-06-14 NOTE — Progress Notes (Signed)
4 Days Post-Op  Subjective:  Remains intubated, but has made some progress in weaning. Sedation now mostly withdrawn the patient is arousable and follows simple commands.  NG tube remains in good position on the chest x-ray. Total NG drainage 500 cc, bilious. JP drainage is scant and serous and only 30 cc in 24 hours.  Chest x-ray shows bibasilar haziness question whether this is aspiration versus pulmonary edema.  Objective: Vital signs in last 24 hours: Temp:  [99.7 F (37.6 C)-101.3 F (38.5 C)] 99.7 F (37.6 C) (08/09 0500) Pulse Rate:  [67-90] 90  (08/09 0500) Resp:  [14-32] 29  (08/09 0500) BP: (108-129)/(45-61) 123/61 mmHg (08/09 0500) SpO2:  [94 %-99 %] 94 % (08/09 0500) FiO2 (%):  [40 %] 40 % (08/09 0400) Weight:  [138 lb 14.2 oz (63 kg)] 138 lb 14.2 oz (63 kg) (08/09 0300) Last BM Date: 06/10/12  Intake/Output from previous day: 08/08 0701 - 08/09 0700 In: 1508.9 [I.V.:383.9; IV Piggyback:265; TPN:860] Out: 1900 [Urine:1370; Emesis/NG output:500; Drains:30] Intake/Output this shift: Total I/O In: 645 [I.V.:160; IV Piggyback:165; TPN:320] Out: 545 [Urine:545]  General appearance: lethargic. Arousable. Follows simple commands. Skin warm and dry. Not agitated. Intubated. Resp: diminished breath sounds at bases. Moving air fairly well anteriorly. GI: abdomen is soft. Rare bowel sounds. Not distended.Midline wound packed open, clean, fascia intact. JP drainage serous, odorless.  Lab Results:  Results for orders placed during the hospital encounter of 06/10/12 (from the past 24 hour(s))  GLUCOSE, CAPILLARY     Status: Abnormal   Collection Time   06/13/12  8:01 AM      Component Value Range   Glucose-Capillary 107 (*) 70 - 99 mg/dL   Comment 1 Documented in Chart     Comment 2 Notify RN    TROPONIN I     Status: Normal   Collection Time   06/13/12  8:56 AM      Component Value Range   Troponin I <0.30  <0.30 ng/mL  LACTIC ACID, PLASMA     Status: Normal   Collection  Time   06/13/12  8:57 AM      Component Value Range   Lactic Acid, Venous 0.8  0.5 - 2.2 mmol/L  COMPREHENSIVE METABOLIC PANEL     Status: Abnormal   Collection Time   06/13/12  9:00 AM      Component Value Range   Sodium 147 (*) 135 - 145 mEq/L   Potassium 3.5  3.5 - 5.1 mEq/L   Chloride 118 (*) 96 - 112 mEq/L   CO2 21  19 - 32 mEq/L   Glucose, Bld 98  70 - 99 mg/dL   BUN 27 (*) 6 - 23 mg/dL   Creatinine, Ser 1.47 (*) 0.50 - 1.10 mg/dL   Calcium 8.9  8.4 - 82.9 mg/dL   Total Protein 4.6 (*) 6.0 - 8.3 g/dL   Albumin 1.7 (*) 3.5 - 5.2 g/dL   AST 19  0 - 37 U/L   ALT 21  0 - 35 U/L   Alkaline Phosphatase 54  39 - 117 U/L   Total Bilirubin 0.3  0.3 - 1.2 mg/dL   GFR calc non Af Amer 21 (*) >90 mL/min   GFR calc Af Amer 24 (*) >90 mL/min  PRO B NATRIURETIC PEPTIDE     Status: Abnormal   Collection Time   06/13/12 11:15 AM      Component Value Range   Pro B Natriuretic peptide (BNP) 463.8 (*) 0 -  125 pg/mL  GLUCOSE, CAPILLARY     Status: Abnormal   Collection Time   06/13/12 12:54 PM      Component Value Range   Glucose-Capillary 102 (*) 70 - 99 mg/dL   Comment 1 Documented in Chart     Comment 2 Notify RN    BASIC METABOLIC PANEL     Status: Abnormal   Collection Time   06/13/12  2:50 PM      Component Value Range   Sodium 145  135 - 145 mEq/L   Potassium 3.5  3.5 - 5.1 mEq/L   Chloride 116 (*) 96 - 112 mEq/L   CO2 19  19 - 32 mEq/L   Glucose, Bld 117 (*) 70 - 99 mg/dL   BUN 27 (*) 6 - 23 mg/dL   Creatinine, Ser 3.08 (*) 0.50 - 1.10 mg/dL   Calcium 8.9  8.4 - 65.7 mg/dL   GFR calc non Af Amer 22 (*) >90 mL/min   GFR calc Af Amer 26 (*) >90 mL/min  GLUCOSE, CAPILLARY     Status: Abnormal   Collection Time   06/13/12  5:20 PM      Component Value Range   Glucose-Capillary 128 (*) 70 - 99 mg/dL  GLUCOSE, CAPILLARY     Status: Abnormal   Collection Time   06/13/12  7:58 PM      Component Value Range   Glucose-Capillary 156 (*) 70 - 99 mg/dL  GLUCOSE, CAPILLARY     Status:  Abnormal   Collection Time   06/13/12 11:52 PM      Component Value Range   Glucose-Capillary 134 (*) 70 - 99 mg/dL   Comment 1 Notify RN     Comment 2 Documented in Chart    GLUCOSE, CAPILLARY     Status: Abnormal   Collection Time   06/14/12  4:00 AM      Component Value Range   Glucose-Capillary 144 (*) 70 - 99 mg/dL     Studies/Results: @RISRSLT24 @     . albuterol-ipratropium  6 puff Inhalation Q4H  . antiseptic oral rinse  15 mL Mouth Rinse QID  . chlorhexidine  15 mL Mouth Rinse BID  . heparin  5,000 Units Subcutaneous Q8H  . insulin aspart  0-15 Units Subcutaneous Q4H  . lidocaine (cardiac) 100 mg/50ml      . pantoprazole (PROTONIX) IV  40 mg Intravenous Q12H  . piperacillin-tazobactam (ZOSYN)  IV  3.375 g Intravenous Q8H  . potassium chloride  10 mEq Intravenous Q1 Hr x 2  . propofol      . rocuronium      . sodium chloride  10-40 mL Intracatheter Q12H  . succinylcholine      . vancomycin  500 mg Intravenous Q24H  . DISCONTD: antiseptic oral rinse  15 mL Mouth Rinse BID  . DISCONTD: antiseptic oral rinse  15 mL Mouth Rinse QID     Assessment/Plan: s/p Procedure(s): EXPLORATORY LAPAROTOMY  POD #4. Closure and omental patch of perforated prepyloric ulcer.  Continue NG tube and bowel rest.  TNA.  Continue double dose proton pump inhibitors and intravenous antibiotics.  Check H. Pylori.  Plan Gastrografin upper GI in a few days before removing the NG tube.  Check labs tomorrow.  Ventilator-dependent respiratory failure. tpulmonary edema which has improved with diuresis vs. spiration.   Hopefully she will be weanable in the next 24-48 hours.    Protein calorie malnutrition. Started on TNA   Diabetes mellitus. On sliding scale insulin  Chronic kidney disease, nonoliguric. Stable.      LOS: 4 days    Izell Labat M. Derrell Lolling, M.D., [facility] General and Minimally invasive Surgery Breast and Colorectal Surgery Office:    [phone] Pager:   [phone]  06/14/2012  . .prob

## 2012-06-14 NOTE — Progress Notes (Signed)
eLink Physician-Brief Progress Note Patient Name: April [name] DOB: 27-Apr-1935 MRN: [id number]  Date of Service  06/14/2012   HPI/Events of Note   Pt has failed Bipap.  eICU Interventions  I have called anesthesia to intubate this pt Orders for vent sent   Intervention Category Major Interventions: Respiratory failure - evaluation and management  Shan Levans 06/14/2012, 8:55 PM

## 2012-06-14 NOTE — Progress Notes (Signed)
Name: April [name] MRN: [id number] DOB: [birth year]    LOS: 4  Referring Provider:  CCS Reason for Referral:  Acute respiratory failure  PULMONARY / CRITICAL CARE MEDICINE   Brief patient description:  77 yo with prob COPD admitted on 8/5 with gastric ulcer perforation s/p exploratory lap with ulcer repair.  Developed respiratory distress on 8/7.  Intubated.  Events Since Admission: 8/5  Admitted with gastric ulcer perforation, exploratory lap with ulcer repair 8/7  Developed respiratory distress, intubated  Current Status: Looks comfortable on SBT, no distress.   Vital Signs: Temp:  [99.7 F (37.6 C)-101.3 F (38.5 C)] 100 F (37.8 C) (08/09 0900) Pulse Rate:  [67-90] 87  (08/09 0900) Resp:  [14-32] 28  (08/09 0900) BP: (108-129)/(45-61) 120/50 mmHg (08/09 0900) SpO2:  [94 %-99 %] 96 % (08/09 0900) FiO2 (%):  [40 %] 40 % (08/09 0751) Weight:  [63 kg (138 lb 14.2 oz)] 63 kg (138 lb 14.2 oz) (08/09 0300)  Intake/Output Summary (Last 24 hours) at 06/14/12 1005 Last data filed at 06/14/12 0900  Gross per 24 hour  Intake   1655 ml  Output   2015 ml  Net   -360 ml    Physical Examination: General:  Mechanically ventilated, synchronous Neuro:  Sedated, opens eyes, follows commands HEENT:  NGT, OETT Neck:  No JVD Cardiovascular:  RRR, tachycardic, no murmurs Lungs:  Bilateral diminished air entry, occ rhonchi Abdomen:  Soft, bowel sounds absent, surgical dressing intact, drain intact Musculoskeletal:  No edema Skin:  Intact  Principal Problem:  *Perforated gastric ulcer Active Problems:  HTN (hypertension)  DM (diabetes mellitus)  Hypercalcemia  CRF (chronic renal failure)  Lung nodule  Hypomagnesemia  Leukocytosis  COPD (chronic obstructive pulmonary disease)  Acute respiratory failure with hypoxia  Acute encephalopathy  Aspiration pneumonia  ASSESSMENT AND PLAN  PULMONARY  Lab 06/12/12 2204  PHART 7.390  PCO2ART 30.1*  PO2ART 89.0  HCO3 17.8*    O2SAT 96.9    Ventilator Settings: Vent Mode:  [-] CPAP FiO2 (%):  [40 %] 40 % Set Rate:  [20 bmp] 20 bmp Vt Set:  [400 mL] 400 mL PEEP:  [5 cmH20] 5 cmH20 Pressure Support:  [8 cmH20-20 cmH20] 10 cmH20 Plateau Pressure:  [15 cmH20] 15 cmH20  CXR:  8/7 >>> Hardware good position, bibasilar airspace disease R>L 8/8 w/out sig change in basilar R>L airspace disease/effusion, aeration a little worse. ETT was deep on am film ETT:  8/7 >>>  A:  Acute hypoxemic respiratory failure. Post-op and in setting of R>L infiltrates. Suspect this is atx, ? Effusion. Consider also PNA Diff dx: negative pressure pulmonary edema vs aspiration vs basilar atx. C/b prob element  COPD, no clear evidence of exacerbation.  Vent mechanics are reassuring.  P:   Cont PSV/ daily assessment for SBT, goal extubation 8/9 Scheduled BDs Lasix as BUN and creatinine allow  See ID section  CARDIOVASCULAR  Lab 06/13/12 1115 06/13/12 0857 06/13/12 0856 06/13/12 0450 06/12/12 2138 06/10/12 1810  TROPONINI -- -- <0.30 <0.30 <0.30 --  LATICACIDVEN -- 0.8 -- 0.7 0.7 4.0*  PROBNP 463.8* -- -- -- -- --   ECG:  8/6 >>> NSR, anterolateral ischemia? Lines: R PICC 8/6 >>> 2 d echo 8/8: ejection fraction was in the range of 55% to 60%. Wall motion was normal; there were no regional wall motion abnormalities. Doppler parameters are consistent with abnormal left ventricular relaxation (grade 1 diastolic dysfunction).  A: Hemodynamically stable.  CEs all negative. History  of dyslipidemia. P:  Holding preadmission ASA, Lipitor; restart ASA when [facility] w CCS  RENAL  Lab 06/14/12 0500 06/13/12 1450 06/13/12 0900 06/13/12 0450 06/12/12 0320 06/11/12 1155  NA 144 145 147* 146* 143 --  K 3.2* 3.5 -- -- -- --  CL 115* 116* 118* 118* 118* --  CO2 21 19 21 21  18* --  BUN 27* 27* 27* 26* 27* --  CREATININE 1.94* 2.08* 2.19* 2.10* 2.04* --  CALCIUM 9.3 8.9 8.9 8.6 8.3* --  MG 1.7 -- -- 1.8 1.2* 1.2*  PHOS 2.9 -- -- 2.8 -- --    Intake/Output      08/08 0701 - 08/09 0700 08/09 0701 - 08/10 0700   I.V. (mL/kg) 463.9 (7.4) 40 (0.6)   IV Piggyback 265    TPN 1020 80   Total Intake(mL/kg) 1748.9 (27.8) 120 (1.9)   Urine (mL/kg/hr) 1400 (0.9) 60   Emesis/NG output 900    Drains 50    Total Output 2350 60   Net -601.1 +60         Foley:  8/5 >>>  A:  Suspected acute on chronic renal failure.      Hypokalemia  Had been seeing nephrology in out-pt setting. Got lasix during the evening 8/7. Not sure what BL Scr is, looks like it is in the 1.9 range.  P: Replace K F/u am chemistry w/diuresis  GASTROINTESTINAL  Lab 06/14/12 0500 06/13/12 0900 06/13/12 0450 06/12/12 0320 06/10/12 1745  AST 21 19 19 25 13   ALT 19 21 24 31 9   ALKPHOS 106 54 57 62 91  BILITOT 0.4 0.3 0.3 0.4 0.3  PROT 4.7* 4.6* 4.7* 4.1* 6.4  ALBUMIN 1.6* 1.7* 1.7* 1.7* 3.4*   A:  Perforated gastric ulcer s/p repair. P:   Per Surgery NPO NG to Sx TNA  HEMATOLOGIC  Lab 06/14/12 0500 06/13/12 0450 06/12/12 0320 06/11/12 1155 06/11/12 0323 06/10/12 1745  HGB 9.5* 10.8* 11.0* 12.5 13.1 --  HCT 27.8* 31.4* 32.5* 36.6 39.0 --  PLT 153 169 178 181 188 --  INR -- -- -- -- -- 0.93  APTT -- -- -- -- -- --   A:  Mild postop anemia. P:  Trend CBC [place] heparin  INFECTIOUS  Lab 06/14/12 0500 06/13/12 0450 06/12/12 2137 06/12/12 0320 06/11/12 1155 06/11/12 0323  WBC 9.7 13.9* -- 13.3* 7.0 3.3*  PROCALCITON 25.60 -- 60.31 -- -- --   Cultures: 8/5  Blood >>>  8/7  Respiratory: rare GPC in pairs>>>  Antibiotics: 8/6  Zosyn >>> 8/7  Vancomycin >>>  A:  Peritonitis.   PCT trending down  P:   Added Vancomycin 8/7   ENDOCRINE  Lab 06/14/12 0801 06/14/12 0400 06/13/12 2352 06/13/12 1958 06/13/12 1720  GLUCAP 168* 144* 134* 156* 128*   A:  DM.  History of hyperthyroidism, not on treatment. P:   Holding preadmission Janumet ssi protocol  NEUROLOGIC  A:  Acute encephalopathy (hypoxia, sedation).  Postop pain. P:   PRN  analgesia Stop diprivan if we extubate.    BEST PRACTICE / DISPOSITION Level of Care:  ICU Primary Service:  CCS Consultants:  PCCM Code Status:  Full Diet:  NPO / TNA DVT Px:  Heparin GI Px:  Protonix Skin Integrity:  Intact Social / Family:  Updated at bedside  Levy Pupa, MD, PhD 06/14/2012, 10:39 AM [facility] [phone] or if no answer [phone]

## 2012-06-14 NOTE — Progress Notes (Signed)
eLink Physician-Brief Progress Note Patient Name: April [name] DOB: 1935-02-20 MRN: [id number]  Date of Service  06/14/2012   HPI/Events of Note   PT worse over evening with desat and increase WOB  eICU Interventions  Give lasix, place on NIMV and chk CXR High risk need for vent again   Intervention Category Major Interventions: Respiratory failure - evaluation and management  Shan Levans 06/14/2012, 8:05 PM

## 2012-06-14 NOTE — Progress Notes (Signed)
Extubated to 4l nasal cannula. Incentive spirometer instructed.

## 2012-06-14 NOTE — Progress Notes (Signed)
PARENTERAL NUTRITION CONSULT NOTE - Follow up  Pharmacy Consult for TNA Indication: s/p gastric surgery, PCM.  No Known Allergies  Patient Measurements: Height: 5\' 2"  (157.5 cm) Weight: 138 lb 14.2 oz (63 kg) IBW/kg (Calculated) : 50.1   Vital Signs: Temp: 99.7 F (37.6 C) (08/09 0600) Temp src: Core (Comment) (08/08 1900) BP: 123/54 mmHg (08/09 0600) Pulse Rate: 87  (08/09 0600) Intake/Output from previous day: 08/08 0701 - 08/09 0700 In: 1688.9 [I.V.:443.9; IV Piggyback:265; TPN:980] Out: 2350 [Urine:1400; Emesis/NG output:900; Drains:50] Intake/Output from this shift: Total I/O In: 825 [I.V.:220; IV Piggyback:165; TPN:440] Out: 995 [Urine:575; Emesis/NG output:400; Drains:20]  Labs:  [facility] 06/14/12 0500 06/13/12 0450 06/12/12 0320  WBC 9.7 13.9* 13.3*  HGB 9.5* 10.8* 11.0*  HCT 27.8* 31.4* 32.5*  PLT 153 169 178  APTT -- -- --  INR -- -- --    Basename 06/14/12 0500 06/13/12 1450 06/13/12 0900 06/13/12 0450 06/12/12 1750 06/12/12 0320 06/11/12 1547  NA 144 145 147* -- -- -- --  K 3.2* 3.5 3.5 -- -- -- --  CL 115* 116* 118* -- -- -- --  CO2 21 19 21  -- -- -- --  GLUCOSE 138* 117* 98 -- -- -- --  BUN 27* 27* 27* -- -- -- --  CREATININE 1.94* 2.08* 2.19* -- -- -- --  LABCREA -- -- -- -- 69.7 -- 60.61  CREAT24HRUR -- -- -- -- -- -- --  CALCIUM 9.3 8.9 8.9 -- -- -- --  MG 1.7 -- -- 1.8 -- 1.2* --  PHOS 2.9 -- -- 2.8 -- -- --  PROT 4.7* -- 4.6* 4.7* -- -- --  ALBUMIN 1.6* -- 1.7* 1.7* -- -- --  AST 21 -- 19 19 -- -- --  ALT 19 -- 21 24 -- -- --  ALKPHOS 106 -- 54 57 -- -- --  BILITOT 0.4 -- 0.3 0.3 -- -- --  BILIDIR -- -- -- -- -- -- --  IBILI -- -- -- -- -- -- --  PREALBUMIN -- -- -- 10.3* -- -- --  TRIG -- -- -- 122 -- -- --  CHOLHDL -- -- -- -- -- -- --  CHOL -- -- -- 54 -- -- --   Estimated Creatinine Clearance: 22.2 ml/min (by C-G formula based on Cr of 1.94).    Basename 06/14/12 0400 06/13/12 2352 06/13/12 1958  GLUCAP 144* 134* 156*     Medical History: Past Medical History  Diagnosis Date  . Diabetes mellitus   . Hyperthyroidism   . Hypertension     Medications:  Scheduled:     . albuterol-ipratropium  6 puff Inhalation Q4H  . antiseptic oral rinse  15 mL Mouth Rinse QID  . chlorhexidine  15 mL Mouth Rinse BID  . heparin  5,000 Units Subcutaneous Q8H  . insulin aspart  0-15 Units Subcutaneous Q4H  . lidocaine (cardiac) 100 mg/19ml      . pantoprazole (PROTONIX) IV  40 mg Intravenous Q12H  . piperacillin-tazobactam (ZOSYN)  IV  3.375 g Intravenous Q8H  . potassium chloride  10 mEq Intravenous Q1 Hr x 2  . propofol      . rocuronium      . sodium chloride  10-40 mL Intracatheter Q12H  . succinylcholine      . vancomycin  500 mg Intravenous Q24H  . DISCONTD: antiseptic oral rinse  15 mL Mouth Rinse BID  . DISCONTD: antiseptic oral rinse  15 mL Mouth Rinse QID   Nutritional Goals: per  RD - 1250-1550Kcal/d and protein 69-82g/d. Clinimix E 5/20 at goal rate 36ml/hr + lipids at 76ml/hr on MWF will provide:  72/day protein and 1746Kcal/day MWF, 1267Kcal/day STTHS(Avg. 1473Kcal/day weekly).  Current nutrition: NPO  CBGs & Insulin requirements past 24 hours: 128-156. Moderate SSI q4h.  [facility]F: [facility]NS at [facility].  Labs:   Electrolytes - Potassium low. Phos and Mg wnl. Mag improved to nml after 2g yesterday. Corr Ca 11.2.   SCr remains elevated.   LFTs - wnl  TGs - wnl (8/8)  Prealbumin 10.3 (8/8)  Assessment:  [name] s/p repair of perforated prepyloric ulcer on 06/10/12. TNA started 8/7 d/t need for bowel rest.  Required intubation 06/12/12 d/t pulmonary edema. Weaned off of propofol 8/8 (provided 1.1 Kcal/ml)  Nutritional goals adjusted for renal insufficiency and to provide less fluid.  Plan:  Per policy, must defer K replacement to MD given CrCl <11ml/min  Continue Clinimix E 5/20, increase to 39ml/hr tonight @ 1800 when new bag hung. Elevated Scr may prevent reaching goal rate (may have to limit  protein) -TNA @ goal rate to provide 1.1g protein/kg  Fat emulsion at 79ml/hr(MWF only due to ongoing shortage).   TNA to contain standard multivitamins and trace elements(MWF only due to ongoing shortage).  TNA lab panels on Mondays & Thursdays.  AM BMET  [name] PharmD  [phone] 06/14/2012 7:20 AM

## 2012-06-15 ENCOUNTER — Inpatient Hospital Stay (HOSPITAL_COMMUNITY): Payer: PRIVATE HEALTH INSURANCE

## 2012-06-15 DIAGNOSIS — E876 Hypokalemia: Secondary | ICD-10-CM

## 2012-06-15 LAB — GLUCOSE, CAPILLARY
Glucose-Capillary: 215 mg/dL — ABNORMAL HIGH (ref 70–99)
Glucose-Capillary: 201 mg/dL — ABNORMAL HIGH (ref 70–99)
Glucose-Capillary: 212 mg/dL — ABNORMAL HIGH (ref 70–99)

## 2012-06-15 LAB — COMPREHENSIVE METABOLIC PANEL
ALT: 20 U/L (ref 0–35)
AST: 20 U/L (ref 0–37)
Albumin: 1.8 g/dL — ABNORMAL LOW (ref 3.5–5.2)
Alkaline Phosphatase: 67 U/L (ref 39–117)
BUN: 30 mg/dL — ABNORMAL HIGH (ref 6–23)
Chloride: 107 mEq/L (ref 96–112)
Potassium: 3.3 mEq/L — ABNORMAL LOW (ref 3.5–5.1)
Total Bilirubin: 0.3 mg/dL (ref 0.3–1.2)

## 2012-06-15 LAB — PROCALCITONIN: Procalcitonin: 12.97 ng/mL

## 2012-06-15 LAB — CBC
HCT: 30.4 % — ABNORMAL LOW (ref 36.0–46.0)
RDW: 14.1 % (ref 11.5–15.5)
WBC: 6.6 10*3/uL (ref 4.0–10.5)

## 2012-06-15 LAB — CULTURE, RESPIRATORY W GRAM STAIN

## 2012-06-15 MED ORDER — ALBUTEROL SULFATE (5 MG/ML) 0.5% IN NEBU
2.5000 mg | INHALATION_SOLUTION | Freq: Four times a day (QID) | RESPIRATORY_TRACT | Status: DC
  Administered 2012-06-15 – 2012-06-24 (×35): 2.5 mg via RESPIRATORY_TRACT
  Filled 2012-06-15 (×37): qty 0.5

## 2012-06-15 MED ORDER — SODIUM CHLORIDE 0.9 % IV SOLN
50.0000 ug/h | INTRAVENOUS | Status: DC
  Administered 2012-06-16: 50 ug/h via INTRAVENOUS
  Filled 2012-06-15: qty 50

## 2012-06-15 MED ORDER — FENTANYL BOLUS VIA INFUSION
10.0000 ug | Freq: Four times a day (QID) | INTRAVENOUS | Status: DC | PRN
  Filled 2012-06-15: qty 25

## 2012-06-15 MED ORDER — INSULIN REGULAR HUMAN 100 UNIT/ML IJ SOLN
INTRAMUSCULAR | Status: AC
  Administered 2012-06-15: 17:00:00 via INTRAVENOUS
  Filled 2012-06-15: qty 2000

## 2012-06-15 MED ORDER — IPRATROPIUM BROMIDE 0.02 % IN SOLN
0.5000 mg | Freq: Four times a day (QID) | RESPIRATORY_TRACT | Status: DC
  Administered 2012-06-15 – 2012-06-24 (×35): 0.5 mg via RESPIRATORY_TRACT
  Filled 2012-06-15 (×37): qty 2.5

## 2012-06-15 MED ORDER — POTASSIUM CHLORIDE 10 MEQ/50ML IV SOLN
10.0000 meq | INTRAVENOUS | Status: AC
  Administered 2012-06-15 (×3): 10 meq via INTRAVENOUS
  Filled 2012-06-15 (×3): qty 50

## 2012-06-15 NOTE — Progress Notes (Addendum)
5 Days Post-Op  Subjective: Failed extubation and back on ventilator, sedated.  Objective: Vital signs in last 24 hours: Temp:  [98.4 F (36.9 C)-100 F (37.8 C)] 98.4 F (36.9 C) (08/10 0700) Pulse Rate:  [73-91] 75  (08/10 0700) Resp:  [14-36] 22  (08/10 0700) BP: (65-129)/(35-59) 105/56 mmHg (08/10 0700) SpO2:  [89 %-97 %] 92 % (08/10 0816) FiO2 (%):  [40 %-100 %] 40 % (08/10 0817) Weight:  [127 lb 6.8 oz (57.8 kg)] 127 lb 6.8 oz (57.8 kg) (08/10 0000) Last BM Date: 06/10/12  Intake/Output from previous day: 08/09 0701 - 08/10 0700 In: 1083 [I.V.:295.5; IV Piggyback:384; TPN:403.5] Out: 5691 [Urine:5160; Emesis/NG output:500; Drains:30; Stool:1] Intake/Output this shift:    PE: Abd-soft, open wound clean  Lab Results:   Basename 06/15/12 0514 06/14/12 0500  WBC 6.6 9.7  HGB 10.0* 9.5*  HCT 30.4* 27.8*  PLT 167 153   BMET  Basename 06/15/12 0514 06/14/12 0500  NA 140 144  K 3.3* 3.2*  CL 107 115*  CO2 26 21  GLUCOSE 201* 138*  BUN 30* 27*  CREATININE 1.93* 1.94*  CALCIUM 10.4 9.3   PT/INR No results found for this basename: LABPROT:2,INR:2 in the last 72 hours Comprehensive Metabolic Panel:    Component Value Date/Time   NA 140 06/15/2012 0514   K 3.3* 06/15/2012 0514   CL 107 06/15/2012 0514   CO2 26 06/15/2012 0514   BUN 30* 06/15/2012 0514   CREATININE 1.93* 06/15/2012 0514   GLUCOSE 201* 06/15/2012 0514   CALCIUM 10.4 06/15/2012 0514   AST 20 06/15/2012 0514   ALT 20 06/15/2012 0514   ALKPHOS 67 06/15/2012 0514   BILITOT 0.3 06/15/2012 0514   PROT 5.3* 06/15/2012 0514   ALBUMIN 1.8* 06/15/2012 0514     Studies/Results: Portable Chest Xray  06/14/2012  *RADIOLOGY REPORT*  Clinical Data: Endotracheal tube placement  PORTABLE CHEST - 1 VIEW  Comparison: 06/14/2012  Findings: Endotracheal tube placed in the interval with tip positioned approximately 9 mm proximal to the carina.  Right sided PICC with catheter tip projecting over the mid SVC.  NG tube  descends into the abdomen, tip not visualized.  Increased interstitial markings/perihilar prominence.  Bilateral pleural effusions, right greater than left.  No definite pneumothorax. Aortic arch atherosclerosis.  Unchanged mediastinal contours.  No interval osseous change. Surgical clips right upper quadrant.  IMPRESSION: Endotracheal tube tip 9 mm proximal to the carina.  Recommend 2 cm retraction.  Otherwise, no interval change in the pleural effusions/edema pattern.  Original Report Authenticated By: Waneta Martins, M.D.   Dg Chest Port 1 View  06/14/2012  *RADIOLOGY REPORT*  Clinical Data: Respiratory distress.  PORTABLE CHEST - 1 VIEW  Comparison: 06/14/2012 at 0450 hours  Findings: Interval extubation.  Moderate right and small left pleural effusions.  Associated lower lobe opacities, likely atelectasis.  No pneumothorax.  The heart is normal in size.  Enteric tube coursing below the diaphragm.  Stable right arm PICC.  IMPRESSION: Interval extubation.  Moderate right and small left pleural effusions.  Associated lower lobe opacities, likely atelectasis.  Original Report Authenticated By: Charline Bills, M.D.   Dg Chest Port 1 View  06/14/2012  *RADIOLOGY REPORT*  Clinical Data: Pleural effusions.  Ventilated patient.  PORTABLE CHEST - 1 VIEW  Comparison: 06/13/2012.  Findings: Endotracheal tube tip is 25 mm from the carina.  Enteric tube is present.  The proximal side port is near the gastroesophageal junction and could be advanced a few  centimeters for better positioning.  This has been retracted compared yesterday's exam.  Bilateral effusions and basilar atelectasis remain present.  No interval change in pulmonary aeration aside from some positional shift in the effusions. Right upper extremity PICC is unchanged.  IMPRESSION:  1.  Endotracheal tube and right upper extremity PICC unchanged. 2.  Endotracheal tube has been pulled back and could be advanced a few centimeters.  The proximal side port  is at the level of the gastroesophageal junction. 3.  Positional shift of pleural effusions with basilar atelectasis. The pulmonary aeration is little changed.  Original Report Authenticated By: Andreas Newport, M.D.   Dg Chest Port 1 View  06/13/2012  *RADIOLOGY REPORT*  Clinical Data: Edema, weakness.  PORTABLE CHEST - 1 VIEW  Comparison: 06/12/2012  Findings: Support devices are in stable position.  Bilateral lower lobe airspace opacities and effusions again noted, right greater than left.  These are stable since prior study.  Heart is normal size.  No acute bony abnormality.  IMPRESSION: Stable bilateral lower lobe airspace opacities and effusions, right greater than left.  Original Report Authenticated By: Cyndie Chime, M.D.    Anti-infectives: Anti-infectives     Start     Dose/Rate Route Frequency Ordered Stop   06/13/12 2200   vancomycin (VANCOCIN) 500 mg in sodium chloride 0.9 % 100 mL IVPB        500 mg 100 mL/hr over 60 Minutes Intravenous Every 24 hours 06/12/12 2218     06/12/12 2230   vancomycin (VANCOCIN) 750 mg in sodium chloride 0.9 % 150 mL IVPB        750 mg 150 mL/hr over 60 Minutes Intravenous  Once 06/12/12 2218 06/12/12 2330   06/11/12 1800  piperacillin-tazobactam (ZOSYN) IVPB 3.375 g       3.375 g 12.5 mL/hr over 240 Minutes Intravenous Every 8 hours 06/11/12 1343     06/11/12 0200   piperacillin-tazobactam (ZOSYN) IVPB 3.375 g  Status:  Discontinued        3.375 g 12.5 mL/hr over 240 Minutes Intravenous Every 8 hours 06/11/12 0133 06/11/12 1341   06/10/12 1945   ertapenem (INVANZ) 1 g in sodium chloride 0.9 % 50 mL IVPB        1 g 100 mL/hr over 30 Minutes Intravenous  Once 06/10/12 1930 06/10/12 2023          Assessment Principal Problem:  *Perforated gastric ulcer- s/p repair and omental patching on 06/10/12 Active Problems:  HTN (hypertension)  DM (diabetes mellitus)  Hypercalcemia  CRF (chronic renal failure)  Lung nodule  Hypomagnesemia   Leukocytosis  COPD (chronic obstructive pulmonary disease)  Acute respiratory failure with hypoxia  Acute encephalopathy  Aspiration pneumonia  Hypokalemia-mild    LOS: 5 days   Plan: Continue TPN and ng for now.  KCL supplement.   Kaulin Chaves J 06/15/2012

## 2012-06-15 NOTE — Progress Notes (Signed)
ANTIBIOTIC CONSULT NOTE - Follow Up Consult  Pharmacy Consult for: Zosyn Indication:   Suspected pneumonia  No Known Allergies  Patient Measurements: Height: 5\' 2"  (157.5 cm) Weight: 127 lb 6.8 oz (57.8 kg) IBW/kg (Calculated) : 50.1   Vital Signs: Temp: 98.4 F (36.9 C) (08/10 0700) Temp src: Core (Comment) (08/10 0000) BP: 105/56 mmHg (08/10 0700) Pulse Rate: 75  (08/10 0700)  Labs:  Basename 06/15/12 0514 06/14/12 0500 06/13/12 1450 06/13/12 0856 06/13/12 0450 06/12/12 2138  HGB 10.0* 9.5* -- -- -- --  HCT 30.4* 27.8* -- -- 31.4* --  PLT 167 153 -- -- 169 --  APTT -- -- -- -- -- --  LABPROT -- -- -- -- -- --  INR -- -- -- -- -- --  HEPARINUNFRC -- -- -- -- -- --  CREATININE 1.93* 1.94* 2.08* -- -- --  CKTOTAL -- -- -- -- -- --  CKMB -- -- -- -- -- --  TROPONINI -- -- -- <0.30 <0.30 <0.30    Estimated Creatinine Clearance: 20.2 ml/min (by C-G formula based on Cr of 1.93).  Assessment:  49 YOF w/ COPD admitted 8/5 w/ gastric ulcer perforation, s/p exploratory lap w/ ulcer repair. Developed respiratory distress 8/7 and was intubated.  Day # 5 Zosyn 3.375g IV q8h, each dose over 4 hours  Vancomycin D/C (8/7-8/10)  Scr elevated, chronic renal insufficiency  WBC wnl, PCT trending down, afebrile  8/5 Ucx = NG (final), 8/5 BCx x 2 = NGTD, 8/6 MRSA by PCR negative, UCx 8/6 NG (final), Trach aspirate Cx 8/8 pending  Goal of Therapy:   Appropriate dose of Zosyn based on renal fx  Eradication of infection   Plan:  Continue Zosyn 3.375g IV q8h, each dose over 4 hours Continue to monitor Scr Adjust dose as necessary  LOT per MD  [name] PharmD  [phone] 06/15/2012 9:25 AM

## 2012-06-15 NOTE — Progress Notes (Signed)
Name: April [name] MRN: [id number] DOB: 1935/10/08    LOS: 5  Referring Provider:  CCS Reason for Referral:  Acute respiratory failure  PULMONARY / CRITICAL CARE MEDICINE   Brief patient description:  77 yo with prob COPD admitted on 8/5 with gastric ulcer perforation s/p exploratory lap with ulcer repair.  Developed respiratory distress on 8/7.  Intubated.  Events Since Admission: 8/5  Admitted with gastric ulcer perforation, exploratory lap with ulcer repair 8/7  Developed respiratory distress, intubated > ext 8/9 am 8/9 pm resp distress, re-et  Current Status: Looks comfortable on vent   Vital Signs: Temp:  [98.4 F (36.9 C)-100 F (37.8 C)] 98.4 F (36.9 C) (08/10 0700) Pulse Rate:  [73-91] 75  (08/10 0700) Resp:  [14-36] 22  (08/10 0700) BP: (65-129)/(35-59) 105/56 mmHg (08/10 0700) SpO2:  [89 %-97 %] 92 % (08/10 0816) FiO2 (%):  [40 %-100 %] 40 % (08/10 0817) Weight:  [127 lb 6.8 oz (57.8 kg)] 127 lb 6.8 oz (57.8 kg) (08/10 0000)  Intake/Output Summary (Last 24 hours) at 06/15/12 0829 Last data filed at 06/15/12 0600  Gross per 24 hour  Intake 1022.98 ml  Output   5631 ml  Net -4608.02 ml   CVP:  [5 mmHg-7 mmHg] 5 mmHg   Physical Examination: General:  Mechanically ventilated, synchronous Neuro:  Sedated, opens eyes, follows commands HEENT:  NGT, OETT Neck:  No JVD Cardiovascular:  RRR, tachycardic, no murmurs Lungs:  Bilateral diminished air entry, occ rhonchi Abdomen:  Soft, bowel sounds absent, surgical dressing intact, drain intact Musculoskeletal:  No edema Skin:  Intact  Principal Problem:  *Perforated gastric ulcer Active Problems:  HTN (hypertension)  DM (diabetes mellitus)  Hypercalcemia  CRF (chronic renal failure)  Lung nodule  Hypomagnesemia  Leukocytosis  COPD (chronic obstructive pulmonary disease)  Acute respiratory failure with hypoxia  Acute encephalopathy  Aspiration pneumonia  ASSESSMENT AND PLAN  PULMONARY  Lab  06/12/12 2204  PHART 7.390  PCO2ART 30.1*  PO2ART 89.0  HCO3 17.8*  O2SAT 96.9    Ventilator Settings: Vent Mode:  [-] PRVC FiO2 (%):  [40 %-100 %] 40 % Set Rate:  [14 bmp] 14 bmp Vt Set:  [400 mL] 400 mL PEEP:  [5 cmH20] 5 cmH20 Plateau Pressure:  [15 cmH20-17 cmH20] 16 cmH20  CXR: 8/9 pm persistent R efffusion /atx/ et a bit low  ETT:  8/7 > 8/9 ETT 8/9 >>>   A:  Acute hypoxemic respiratory failure. Post-op and in setting of R>L infiltrates. Suspect this is atx, ? Effusion. Consider also PNA Diff dx: negative pressure pulmonary edema vs aspiration vs basilar atx. C/b prob element  COPD, no clear evidence of exacerbation.  Vent mechanics are reassuring.  P:   Cont PSV/ daily assessment for SBT starting 8/11, rest 8/10 Scheduled BDs Lasix as BUN and creatinine allow  See ID section  CARDIOVASCULAR  Lab 06/13/12 1115 06/13/12 0857 06/13/12 0856 06/13/12 0450 06/12/12 2138 06/10/12 1810  TROPONINI -- -- <0.30 <0.30 <0.30 --  LATICACIDVEN -- 0.8 -- 0.7 0.7 4.0*  PROBNP 463.8* -- -- -- -- --   ECG:  8/6 >NSR, anterolateral ischemia? Lines: R PICC 8/6 >>>  2 d echo 8/8: ejection fraction was in the range of 55% to 60%. Wall motion was normal; there were no regional wall motion abnormalities. Doppler parameters are consistent with abnormal left ventricular relaxation (grade 1 diastolic dysfunction).  A: Hemodynamically stable.  CEs all negative. History of dyslipidemia. P:  Holding preadmission ASA,  Lipitor; restart ASA when [facility] w CCS  RENAL  Lab 06/15/12 0514 06/14/12 0500 06/13/12 1450 06/13/12 0900 06/13/12 0450 06/12/12 0320 06/11/12 1155  NA 140 144 145 147* 146* -- --  K 3.3* 3.2* -- -- -- -- --  CL 107 115* 116* 118* 118* -- --  CO2 26 21 19 21 21  -- --  BUN 30* 27* 27* 27* 26* -- --  CREATININE 1.93* 1.94* 2.08* 2.19* 2.10* -- --  CALCIUM 10.4 9.3 8.9 8.9 8.6 -- --  MG -- 1.7 -- -- 1.8 1.2* 1.2*  PHOS -- 2.9 -- -- 2.8 -- --   Intake/Output      08/09  0701 - 08/10 0700 08/10 0701 - 08/11 0700   I.V. (mL/kg) 295.5 (5.1)    IV Piggyback 384    TPN 403.5    Total Intake(mL/kg) 1083 (18.7)    Urine (mL/kg/hr) 5160 (3.7)    Emesis/NG output 500    Drains 30    Stool 1    Total Output 5691    Net -4608          Foley:  8/5 >>>  A:  Suspected acute on chronic renal failure.      Hypokalemia  Had been seeing nephrology in out-pt setting. Got lasix during the evening 8/7.  Replace K prn F/u am chemistry w/diuresis  GASTROINTESTINAL  Lab 06/15/12 0514 06/14/12 0500 06/13/12 0900 06/13/12 0450 06/12/12 0320  AST 20 21 19 19 25   ALT 20 19 21 24 31   ALKPHOS 67 106 54 57 62  BILITOT 0.3 0.4 0.3 0.3 0.4  PROT 5.3* 4.7* 4.6* 4.7* 4.1*  ALBUMIN 1.8* 1.6* 1.7* 1.7* 1.7*   A:  Perforated gastric ulcer s/p repair. P:   Per Surgery NPO NG to Sx TNA  HEMATOLOGIC  Lab 06/15/12 0514 06/14/12 0500 06/13/12 0450 06/12/12 0320 06/11/12 1155 06/10/12 1745  HGB 10.0* 9.5* 10.8* 11.0* 12.5 --  HCT 30.4* 27.8* 31.4* 32.5* 36.6 --  PLT 167 153 169 178 181 --  INR -- -- -- -- -- 0.93  APTT -- -- -- -- -- --   A:  Mild postop anemia. P:  Trend CBC [place] heparin  INFECTIOUS  Lab 06/15/12 0514 06/14/12 0500 06/13/12 0450 06/12/12 2137 06/12/12 0320 06/11/12 1155  WBC 6.6 9.7 13.9* -- 13.3* 7.0  PROCALCITON 12.97 25.60 -- 60.31 -- --   Cultures: 8/5  Blood x2  >>> 8/5 Urine > neg 8/6 Urine > neg 8/8 Respiratory: rare GPC in pairs>>>  Antibiotics: 8/6  Zosyn >>> 8/7  Vancomycin >>>  A:  Peritonitis.   PCT trending down  P:   Added Vancomycin 8/7 due to resp gm stain > d/cd 8/10 as no mrsa   ENDOCRINE  Lab 06/15/12 0809 06/15/12 0314 06/15/12 0002 06/14/12 1924 06/14/12 1616  GLUCAP 201* 215* 221* 222* 193*   A:  DM.  History of hyperthyroidism, not on treatment. P:   Holding preadmission Janumet ssi protocol  NEUROLOGIC  A:  Acute encephalopathy (hypoxia, sedation).  Postop pain. P:   PRN analgesia     BEST  PRACTICE / DISPOSITION Level of Care:  ICU Primary Service:  CCS Consultants:  PCCM Code Status:  Full Diet:  NPO / TNA DVT Px:  Heparin GI Px:  Protonix Skin Integrity:  Intact Social / Family:  Updated at bedside   Sandrea Hughs, MD Pulmonary and Critical Care Medicine [facility] Cell [phone]

## 2012-06-15 NOTE — Progress Notes (Signed)
PARENTERAL NUTRITION CONSULT NOTE - Follow up  Pharmacy Consult for TNA Indication: s/p gastric surgery, PCM.  No Known Allergies  Patient Measurements: Height: 5\' 2"  (157.5 cm) Weight: 127 lb 6.8 oz (57.8 kg) IBW/kg (Calculated) : 50.1   Vital Signs: Temp: 98.4 F (36.9 C) (08/10 0700) Temp src: Core (Comment) (08/10 0000) BP: 105/56 mmHg (08/10 0700) Pulse Rate: 75  (08/10 0700) Intake/Output from previous day: 08/09 0701 - 08/10 0700 In: 1083 [I.V.:295.5; IV Piggyback:384; TPN:403.5] Out: 5691 [Urine:5160; Emesis/NG output:500; Drains:30; Stool:1] Intake/Output from this shift:    Labs:  Basename 06/15/12 0514 06/14/12 0500 06/13/12 0450  WBC 6.6 9.7 13.9*  HGB 10.0* 9.5* 10.8*  HCT 30.4* 27.8* 31.4*  PLT 167 153 169  APTT -- -- --  INR -- -- --    Basename 06/15/12 0514 06/14/12 0500 06/13/12 1450 06/13/12 0900 06/13/12 0450 06/12/12 1750  NA 140 144 145 -- -- --  K 3.3* 3.2* 3.5 -- -- --  CL 107 115* 116* -- -- --  CO2 26 21 19  -- -- --  GLUCOSE 201* 138* 117* -- -- --  BUN 30* 27* 27* -- -- --  CREATININE 1.93* 1.94* 2.08* -- -- --  LABCREA -- -- -- -- -- 69.7  CREAT24HRUR -- -- -- -- -- --  CALCIUM 10.4 9.3 8.9 -- -- --  MG -- 1.7 -- -- 1.8 --  PHOS -- 2.9 -- -- 2.8 --  PROT 5.3* 4.7* -- 4.6* -- --  ALBUMIN 1.8* 1.6* -- 1.7* -- --  AST 20 21 -- 19 -- --  ALT 20 19 -- 21 -- --  ALKPHOS 67 106 -- 54 -- --  BILITOT 0.3 0.4 -- 0.3 -- --  BILIDIR -- -- -- -- -- --  IBILI -- -- -- -- -- --  PREALBUMIN -- -- -- -- 10.3* --  TRIG -- -- -- -- 122 --  CHOLHDL -- -- -- -- -- --  CHOL -- -- -- -- 54 --   Estimated Creatinine Clearance: 20.2 ml/min (by C-G formula based on Cr of 1.93).    Basename 06/15/12 0809 06/15/12 0314 06/15/12 0002  GLUCAP 201* 215* 221*    Medical History: Past Medical History  Diagnosis Date  . Diabetes mellitus   . Hyperthyroidism   . Hypertension     Medications:  Scheduled:     . ipratropium  0.5 mg Nebulization  Q6H   And  . albuterol  2.5 mg Nebulization Q6H  . antiseptic oral rinse  15 mL Mouth Rinse QID  . chlorhexidine  15 mL Mouth Rinse BID  . furosemide  40 mg Intravenous Once  . furosemide  40 mg Intravenous Once  . heparin  5,000 Units Subcutaneous Q8H  . insulin aspart  0-15 Units Subcutaneous Q4H  . pantoprazole (PROTONIX) IV  40 mg Intravenous Q12H  . piperacillin-tazobactam (ZOSYN)  IV  3.375 g Intravenous Q8H  . potassium chloride  10 mEq Intravenous Q1 Hr x 6  . potassium chloride  10 mEq Intravenous Q1 Hr x 3  . sodium chloride  10-40 mL Intracatheter Q12H  . DISCONTD: albuterol  2.5 mg Nebulization Q4H  . DISCONTD: albuterol-ipratropium  6 puff Inhalation Q4H  . DISCONTD: ipratropium  0.5 mg Nebulization Q4H  . DISCONTD: vancomycin  500 mg Intravenous Q24H   Nutritional Goals: per RD - 1250-1550Kcal/d and protein 69-82g/d. Clinimix E 5/20 at goal rate 46ml/hr + lipids at 72ml/hr on MWF will provide:  72/day protein and  1746Kcal/day MWF, 1267Kcal/day STTHS(Avg. 1473Kcal/day weekly).  Current nutrition: NPO  CBGs & Insulin requirements past 24 hours: 168-222. Moderate SSI q4h (23 units used).  IVF: D5NS at [facility].  Labs:   Electrolytes - Potassium low. Phos and Mg wnl (8/9).  Corr Ca 12 - high, trending up.   SCr remains elevated (chronic renal insufficiency, baseline Scr unknown)  LFTs - wnl  TGs - wnl (8/8)  Prealbumin 10.3 (8/8)  Assessment:  [name] s/p repair of perforated prepyloric ulcer on 06/10/12. TNA started 8/7 d/t need for bowel rest.  Required intubation 06/12/12 d/t pulmonary edema. Weaned off of propofol 8/8 (provided 1.1 Kcal/ml)  Nutritional goals adjusted for renal insufficiency and to provide less fluid.  Plan:  Per policy, must defer K replacement to MD given CrCl <72ml/min, KCl x 6 yesterday, x 3 today  Will add 20 units of insulin per 2L bag  Monitor Ca for now (cannot adjust Ca content of TNA - can only change to electrolyte free  formula)   Continue Clinimix E 5/20@ 90ml/hr. Await CBG to normalize before titrating to goal  Fat emulsion at 67ml/hr(MWF only due to ongoing shortage).   TNA to contain standard multivitamins and trace elements(MWF only due to ongoing shortage).  TNA lab panels on Mondays & Thursdays.  AM BMET  [name] PharmD  [phone] 06/15/2012 8:51 AM

## 2012-06-16 ENCOUNTER — Inpatient Hospital Stay (HOSPITAL_COMMUNITY): Payer: PRIVATE HEALTH INSURANCE

## 2012-06-16 LAB — BLOOD GAS, ARTERIAL
Drawn by: 308601
O2 Saturation: 94.9 %
PEEP: 5 cmH2O
Patient temperature: 100
RATE: 14 resp/min

## 2012-06-16 LAB — GLUCOSE, CAPILLARY
Glucose-Capillary: 227 mg/dL — ABNORMAL HIGH (ref 70–99)
Glucose-Capillary: 210 mg/dL — ABNORMAL HIGH (ref 70–99)
Glucose-Capillary: 193 mg/dL — ABNORMAL HIGH (ref 70–99)

## 2012-06-16 LAB — BASIC METABOLIC PANEL
BUN: 35 mg/dL — ABNORMAL HIGH (ref 6–23)
Chloride: 108 mEq/L (ref 96–112)
GFR calc non Af Amer: 26 mL/min — ABNORMAL LOW (ref >90)
Glucose, Bld: 214 mg/dL — ABNORMAL HIGH (ref 70–99)
Potassium: 3.8 mEq/L (ref 3.5–5.1)

## 2012-06-16 MED ORDER — INSULIN REGULAR HUMAN 100 UNIT/ML IJ SOLN
INTRAMUSCULAR | Status: AC
  Administered 2012-06-16: 17:00:00 via INTRAVENOUS
  Filled 2012-06-16: qty 2000

## 2012-06-16 MED ORDER — SODIUM CHLORIDE 0.9 % IV SOLN
INTRAVENOUS | Status: DC
  Administered 2012-06-16: 75 mL via INTRAVENOUS
  Administered 2012-06-16: 75 mL/h via INTRAVENOUS
  Administered 2012-06-19: 50 mL/h via INTRAVENOUS

## 2012-06-16 MED ORDER — NOREPINEPHRINE BITARTRATE 1 MG/ML IJ SOLN
2.0000 ug/min | INTRAVENOUS | Status: DC
  Administered 2012-06-16: 5 ug/min via INTRAVENOUS
  Filled 2012-06-16: qty 4

## 2012-06-16 NOTE — Progress Notes (Signed)
PARENTERAL NUTRITION CONSULT NOTE - Follow up  Pharmacy Consult for TNA Indication: s/p gastric surgery, PCM.  No Known Allergies  Patient Measurements: Height: 5\' 2"  (157.5 cm) Weight: 127 lb 13.9 oz (58 kg) IBW/kg (Calculated) : 50.1   Vital Signs: Temp: 99.1 F (37.3 C) (08/11 0700) Temp src: Core (Comment) (08/11 0400) BP: 116/40 mmHg (08/11 0700) Pulse Rate: 84  (08/11 0700) Intake/Output from previous day: 08/10 0701 - 08/11 0700 In: 2359.2 [I.V.:244.2; IV Piggyback:315; TPN:1800] Out: 1841 [Urine:860; Emesis/NG output:950; Drains:30; Stool:1] Intake/Output from this shift:    Labs:  [facility] 06/15/12 0514 06/14/12 0500  WBC 6.6 9.7  HGB 10.0* 9.5*  HCT 30.4* 27.8*  PLT 167 153  APTT -- --  INR -- --    Basename 06/16/12 0513 06/15/12 0514 06/14/12 0500 06/13/12 0900  NA 139 140 144 --  K 3.8 3.3* 3.2* --  CL 108 107 115* --  CO2 23 26 21  --  GLUCOSE 214* 201* 138* --  BUN 35* 30* 27* --  CREATININE 1.86* 1.93* 1.94* --  LABCREA -- -- -- --  CREAT24HRUR -- -- -- --  CALCIUM 10.8* 10.4 9.3 --  MG -- -- 1.7 --  PHOS -- -- 2.9 --  PROT -- 5.3* 4.7* 4.6*  ALBUMIN -- 1.8* 1.6* 1.7*  AST -- 20 21 19   ALT -- 20 19 21   ALKPHOS -- 67 106 54  BILITOT -- 0.3 0.4 0.3  BILIDIR -- -- -- --  IBILI -- -- -- --  PREALBUMIN -- -- -- --  TRIG -- -- -- --  CHOLHDL -- -- -- --  CHOL -- -- -- --   Estimated Creatinine Clearance: 21 ml/min (by C-G formula based on Cr of 1.86).    Basename 06/16/12 0415 06/15/12 2323 06/15/12 2018  GLUCAP 192* 158* 228*    Medical History: Past Medical History  Diagnosis Date  . Diabetes mellitus   . Hyperthyroidism   . Hypertension     Medications:  Scheduled:     . ipratropium  0.5 mg Nebulization Q6H   And  . albuterol  2.5 mg Nebulization Q6H  . antiseptic oral rinse  15 mL Mouth Rinse QID  . chlorhexidine  15 mL Mouth Rinse BID  . heparin  5,000 Units Subcutaneous Q8H  . insulin aspart  0-15 Units Subcutaneous  Q4H  . pantoprazole (PROTONIX) IV  40 mg Intravenous Q12H  . piperacillin-tazobactam (ZOSYN)  IV  3.375 g Intravenous Q8H  . potassium chloride  10 mEq Intravenous Q1 Hr x 3  . sodium chloride  10-40 mL Intracatheter Q12H  . DISCONTD: albuterol  2.5 mg Nebulization Q4H  . DISCONTD: ipratropium  0.5 mg Nebulization Q4H  . DISCONTD: vancomycin  500 mg Intravenous Q24H   Nutritional Goals:  per RD: 1250-1550Kcal/d and protein 69-82g/d. Clinimix E 5/20 at goal rate 68ml/hr + lipids at 8ml/hr on MWF will provide:  72/day protein and 1746Kcal/day MWF, 1267Kcal/day STTHS(Avg. 1473Kcal/day weekly).  Current nutrition:  NPO Climimix E 5/20 @50ml /hr MIVF: D5NS @ KVO  CBGs & Insulin requirements past 24 hours: [id number] Moderate SSI q4h (29 units used). 20 units/2L insulin in TNA  Labs:   Electrolytes - K wnl after replacement (per MD since CrCl <21). Phos and Mg wnl (8/9).  Corr Ca 12.6 - high, trending up.   SCr remains elevated, slow trend down (chronic renal insufficiency, baseline Scr unknown)  LFTs - wnl  TGs - wnl (8/8)  Prealbumin 10.3 (8/8)  Assessment:  [name] s/p repair of perforated prepyloric ulcer on 06/10/12. TNA started 8/7 d/t need for bowel rest.  Required intubation 06/12/12 d/t pulmonary edema. Weaned off of propofol 8/8 (provided 1.1 Kcal/ml)  Nutritional goals adjusted for renal insufficiency and to provide less fluid.  Plan:  Will increase insulin in TNA to 30 units per 2L bag  Monitor Ca for now (cannot adjust Ca content of TNA - can only change to electrolyte free formula)   Continue Clinimix E 5/20 @ 43ml/hr. Await CBG to normalize before titrating to goal  Fat emulsion at 9ml/hr(MWF only due to ongoing shortage).   TNA to contain standard multivitamins and trace elements(MWF only due to ongoing shortage).  TNA lab panels on Mondays & Thursdays.  April [name] PharmD  [phone] 06/16/2012 7:38 AM

## 2012-06-16 NOTE — Progress Notes (Signed)
eLink Physician-Brief Progress Note Patient Name: April [name] DOB: 17-Nov-1934 MRN: [id number]  Date of Service  06/16/2012   HPI/Events of Note   Hypotensive and dyssynchronous on vent  eICU Interventions   Levophed for now, (avoiding fluid for pulm edema?), still awaiting CXR   Intervention Category Major Interventions: Hypotension - evaluation and management  [name] 06/16/2012, 4:17 AM

## 2012-06-16 NOTE — Progress Notes (Signed)
6 Days Post-Op  Subjective: Remains sedated on the vent.  Levophed started because of hypotension.  Objective: Vital signs in last 24 hours: Temp:  [98.8 F (37.1 C)-100 F (37.8 C)] 99 F (37.2 C) (08/11 0800) Pulse Rate:  [71-95] 85  (08/11 0800) Resp:  [14-31] 21  (08/11 0800) BP: (80-122)/(33-54) 122/47 mmHg (08/11 0800) SpO2:  [87 %-99 %] 94 % (08/11 0819) FiO2 (%):  [40 %-100 %] 60 % (08/11 0820) Weight:  [127 lb 13.9 oz (58 kg)] 127 lb 13.9 oz (58 kg) (08/11 0400) Last BM Date: 06/10/12  Intake/Output from previous day: 08/10 0701 - 08/11 0700 In: 2452.7 [I.V.:287.7; IV Piggyback:315; TPN:1850] Out: 1841 [Urine:860; Emesis/NG output:950; Drains:30; Stool:1] Intake/Output this shift: Total I/O In: 88.8 [I.V.:38.8; TPN:50] Out: 175 [Urine:175]  PE: Abd-soft, open wound remains clean, fascia intact  Lab Results:   Basename 06/15/12 0514 06/14/12 0500  WBC 6.6 9.7  HGB 10.0* 9.5*  HCT 30.4* 27.8*  PLT 167 153   BMET  Basename 06/16/12 0513 06/15/12 0514  NA 139 140  K 3.8 3.3*  CL 108 107  CO2 23 26  GLUCOSE 214* 201*  BUN 35* 30*  CREATININE 1.86* 1.93*  CALCIUM 10.8* 10.4   PT/INR No results found for this basename: LABPROT:2,INR:2 in the last 72 hours Comprehensive Metabolic Panel:    Component Value Date/Time   NA 139 06/16/2012 0513   K 3.8 06/16/2012 0513   CL 108 06/16/2012 0513   CO2 23 06/16/2012 0513   BUN 35* 06/16/2012 0513   CREATININE 1.86* 06/16/2012 0513   GLUCOSE 214* 06/16/2012 0513   CALCIUM 10.8* 06/16/2012 0513   AST 20 06/15/2012 0514   ALT 20 06/15/2012 0514   ALKPHOS 67 06/15/2012 0514   BILITOT 0.3 06/15/2012 0514   PROT 5.3* 06/15/2012 0514   ALBUMIN 1.8* 06/15/2012 0514     Studies/Results: Dg Chest Port 1 View  06/16/2012  *RADIOLOGY REPORT*  Clinical Data: Respiratory distress  PORTABLE CHEST - 1 VIEW  Comparison: 06/15/2012  Findings: Endotracheal tube tip positioned 3.5 cm proximal to the carina.  Right sided PICC with  catheter tip projecting over the distal SVC.  Aortic arch atherosclerosis.  Bibasilar opacities and small pleural effusions. Allowing for differences in positioning/technique similar in size.  No interval osseous change. Mild thoracolumbar curvature.  NG tube descends into the abdomen, tip  likely over the gastric fundus.  IMPRESSION: Appropriately positioned support devices as above.  Small bilateral pleural effusions with associated bibasilar opacities; atelectasis, aspiration, or pneumonia.  Original Report Authenticated By: Waneta Martins, M.D.   Dg Chest Port 1 View  06/15/2012  *RADIOLOGY REPORT*  Clinical Data: Repositioning of endotracheal tube.  PORTABLE CHEST - 1 VIEW 06/15/2012 1030 hours:  Comparison: Portable chest x-ray yesterday.  Findings: Endotracheal tube tip now in satisfactory position projecting approximately 6 cm above the carina.  Nasogastric tube courses below the diaphragm into the stomach.  Right arm PICC tip projects over the mid SVC, unchanged.  Dense consolidation in the lower lobes and bilateral effusions, right greater than left, unchanged.  No new pulmonary parenchymal abnormality.  Cardiac silhouette normal in size for technique.  Pulmonary vascularity normal.  IMPRESSION:  1.  Endotracheal tube tip now in satisfactory position projecting approximately 6 cm above the carina. 2.  Remaining support apparatus satisfactory. 3.  Stable bilateral pleural effusions, right greater than left, and associated dense passive atelectasis and/or pneumonia in the lower lobes.  No new abnormalities.  Original Report  Authenticated By: Arnell Sieving, M.D.   Portable Chest Xray  06/14/2012  *RADIOLOGY REPORT*  Clinical Data: Endotracheal tube placement  PORTABLE CHEST - 1 VIEW  Comparison: 06/14/2012  Findings: Endotracheal tube placed in the interval with tip positioned approximately 9 mm proximal to the carina.  Right sided PICC with catheter tip projecting over the mid SVC.  NG tube  descends into the abdomen, tip not visualized.  Increased interstitial markings/perihilar prominence.  Bilateral pleural effusions, right greater than left.  No definite pneumothorax. Aortic arch atherosclerosis.  Unchanged mediastinal contours.  No interval osseous change. Surgical clips right upper quadrant.  IMPRESSION: Endotracheal tube tip 9 mm proximal to the carina.  Recommend 2 cm retraction.  Otherwise, no interval change in the pleural effusions/edema pattern.  Original Report Authenticated By: Waneta Martins, M.D.   Dg Chest Port 1 View  06/14/2012  *RADIOLOGY REPORT*  Clinical Data: Respiratory distress.  PORTABLE CHEST - 1 VIEW  Comparison: 06/14/2012 at 0450 hours  Findings: Interval extubation.  Moderate right and small left pleural effusions.  Associated lower lobe opacities, likely atelectasis.  No pneumothorax.  The heart is normal in size.  Enteric tube coursing below the diaphragm.  Stable right arm PICC.  IMPRESSION: Interval extubation.  Moderate right and small left pleural effusions.  Associated lower lobe opacities, likely atelectasis.  Original Report Authenticated By: Charline Bills, M.D.    Anti-infectives: Anti-infectives     Start     Dose/Rate Route Frequency Ordered Stop   06/13/12 2200   vancomycin (VANCOCIN) 500 mg in sodium chloride 0.9 % 100 mL IVPB  Status:  Discontinued        500 mg 100 mL/hr over 60 Minutes Intravenous Every 24 hours 06/12/12 2218 06/15/12 0840   06/12/12 2230   vancomycin (VANCOCIN) 750 mg in sodium chloride 0.9 % 150 mL IVPB        750 mg 150 mL/hr over 60 Minutes Intravenous  Once 06/12/12 2218 06/12/12 2330   06/11/12 1800  piperacillin-tazobactam (ZOSYN) IVPB 3.375 g       3.375 g 12.5 mL/hr over 240 Minutes Intravenous Every 8 hours 06/11/12 1343     06/11/12 0200   piperacillin-tazobactam (ZOSYN) IVPB 3.375 g  Status:  Discontinued        3.375 g 12.5 mL/hr over 240 Minutes Intravenous Every 8 hours 06/11/12 0133 06/11/12  1341   06/10/12 1945   ertapenem (INVANZ) 1 g in sodium chloride 0.9 % 50 mL IVPB        1 g 100 mL/hr over 30 Minutes Intravenous  Once 06/10/12 1930 06/10/12 2023          Assessment Principal Problem:  *Perforated gastric ulcer- s/p repair and omental patching on 06/10/12; remains critically ill Active Problems:  HTN (hypertension)  DM (diabetes mellitus)  Hypercalcemia  CRF (chronic renal failure)  Lung nodule  Hypomagnesemia  Leukocytosis  COPD (chronic obstructive pulmonary disease)  Acute respiratory failure with hypoxia  Acute encephalopathy  Aspiration pneumonia  Hypokalemia-resolved    LOS: 6 days   Plan: Continue supportive care.  Situation discussed with family member.   Germani Gavilanes J 06/16/2012

## 2012-06-16 NOTE — Progress Notes (Signed)
eLink Physician-Brief Progress Note Patient Name: April [name] DOB: [birth year] MRN: [id number]  Date of Service  06/16/2012   HPI/Events of Note   More hypoxemia tonight; synchronous on vent, RN says lungs sound OK  eICU Interventions  Stat ABG Stat CXR Increase PEEP to 10 now   Intervention Category Major Interventions: Hypoxemia - evaluation and management  MCQUAID, DOUGLAS 06/16/2012, 3:54 AM

## 2012-06-16 NOTE — Progress Notes (Signed)
Name: April [name] MRN: [id number] DOB: Sep 06, 1935    LOS: 6  Referring Provider:  CCS Reason for Referral:  Acute respiratory failure  PULMONARY / CRITICAL CARE MEDICINE   Brief patient description:  46 yowf longterm smoker with prob COPD Class 2/3 dyspnea at baseline admitted on 8/5 with gastric ulcer perforation s/p exploratory lap with ulcer repair.  Developed respiratory distress on 8/7.  Intubated.  Events Since Admission: 8/5  Admitted with gastric ulcer perforation, exploratory lap with ulcer repair 8/7  Developed respiratory distress, intubated > ext 8/9 am 8/9 pm resp distress, re-et  Current Status: Looks comfortable on vent but sedated/ back on pressors   Vital Signs: Temp:  [98.6 F (37 C)-100 F (37.8 C)] 98.8 F (37.1 C) (08/11 1000) Pulse Rate:  [71-95] 89  (08/11 1000) Resp:  [14-31] 27  (08/11 1000) BP: (80-136)/(33-54) 94/47 mmHg (08/11 1000) SpO2:  [87 %-99 %] 95 % (08/11 1000) FiO2 (%):  [40 %-100 %] 60 % (08/11 0820) Weight:  [127 lb 13.9 oz (58 kg)] 127 lb 13.9 oz (58 kg) (08/11 0400)  Intake/Output Summary (Last 24 hours) at 06/16/12 1023 Last data filed at 06/16/12 0957  Gross per 24 hour  Intake 2593.35 ml  Output   1916 ml  Net 677.35 ml         Physical Examination: General:  Mechanically ventilated, synchronous Neuro:  Sedated, opens eyes, follows commands intermittently HEENT:  NGT, OETT Neck:  No JVD Cardiovascular:  RRR, tachycardic, no murmurs Lungs:  Bilateral diminished air entry, occ rhonchi Abdomen:  Soft, bowel sounds absent, surgical dressing intact, drain intact Musculoskeletal:  No edema Skin:  Intact  Principal Problem:  *Perforated gastric ulcer Active Problems:  HTN (hypertension)  DM (diabetes mellitus)  Hypercalcemia  CRF (chronic renal failure)  Lung nodule  Hypomagnesemia  Leukocytosis  COPD (chronic obstructive pulmonary disease)  Acute respiratory failure with hypoxia  Acute encephalopathy  Aspiration pneumonia  ASSESSMENT AND PLAN  PULMONARY  Lab 06/16/12 0352 06/12/12 2204  PHART 7.384 7.390  PCO2ART 40.8 30.1*  PO2ART 80.3 89.0  HCO3 23.6 17.8*  O2SAT 94.9 96.9    Ventilator Settings: Vent Mode:  [-] PRVC FiO2 (%):  [40 %-100 %] 60 % Set Rate:  [14 bmp] 14 bmp Vt Set:  [400 mL] 400 mL PEEP:  [5 cmH20-10 cmH20] 10 cmH20 Plateau Pressure:  [13 cmH20-20 cmH20] 19 cmH20  CXR: 8/11 Appropriately positioned support devices as above.  Small bilateral pleural effusions with associated bibasilar  opacities; atelectasis, aspiration, or pneumonia.   ETT:  8/7 > 8/9 ETT 8/9 >>>   A:  Acute hypoxemic respiratory failure. Post-op and in setting of R>L infiltrates and probable underlying copd   P:   Scheduled BDs Lasix as BUN and creatinine and BP allow  See ID section Avoid ards protocol as sign underlying copd and tendency to airtrapping    CARDIOVASCULAR  Lab 06/13/12 1115 06/13/12 0857 06/13/12 0856 06/13/12 0450 06/12/12 2138 06/10/12 1810  TROPONINI -- -- <0.30 <0.30 <0.30 --  LATICACIDVEN -- 0.8 -- 0.7 0.7 4.0*  PROBNP 463.8* -- -- -- -- --   ECG:  8/6 >NSR, anterolateral ischemia? > troponins neg Lines: R PICC 8/6 >>>  2 d echo 8/8: ejection fraction was in the range of 55% to 60%. Wall motion was normal; there were no regional wall motion abnormalities. Doppler parameters are consistent with abnormal left ventricular relaxation (grade 1 diastolic dysfunction).  A: Shock, pressor dep P: follow cvp/ wean  off levophed  RENAL  Lab 06/16/12 0513 06/15/12 0514 06/14/12 0500 06/13/12 1450 06/13/12 0900 06/13/12 0450 06/12/12 0320 06/11/12 1155  NA 139 140 144 145 147* -- -- --  K 3.8 3.3* -- -- -- -- -- --  CL 108 107 115* 116* 118* -- -- --  CO2 23 26 21 19 21  -- -- --  BUN 35* 30* 27* 27* 27* -- -- --  CREATININE 1.86* 1.93* 1.94* 2.08* 2.19* -- -- --  CALCIUM 10.8* 10.4 9.3 8.9 8.9 -- -- --  MG -- -- 1.7 -- -- 1.8 1.2* 1.2*  PHOS -- -- 2.9 --  -- 2.8 -- --   Intake/Output      08/10 0701 - 08/11 0700 08/11 0701 - 08/12 0700   I.V. (mL/kg) 297.7 (5.1) 100.9 (1.7)   IV Piggyback 315 50   TPN 1850 100   Total Intake(mL/kg) 2462.7 (42.5) 250.9 (4.3)   Urine (mL/kg/hr) 860 (0.6) 175   Emesis/NG output 950    Drains 30    Stool 1    Total Output 1841 175   Net +621.7 +75.9         Foley:  8/5 >>>  A:  Suspected acute on chronic renal failure.      Hypokalemia  Had been seeing nephrology in out-pt setting. Got lasix during the evening 8/7.  Replace K prn Keep tank full   GASTROINTESTINAL  Lab 06/15/12 0514 06/14/12 0500 06/13/12 0900 06/13/12 0450 06/12/12 0320  AST 20 21 19 19 25   ALT 20 19 21 24 31   ALKPHOS 67 106 54 57 62  BILITOT 0.3 0.4 0.3 0.3 0.4  PROT 5.3* 4.7* 4.6* 4.7* 4.1*  ALBUMIN 1.8* 1.6* 1.7* 1.7* 1.7*   A:  Perforated gastric ulcer s/p repair. P:   Per Surgery NPO NG to Sx TNA  HEMATOLOGIC  Lab 06/15/12 0514 06/14/12 0500 06/13/12 0450 06/12/12 0320 06/11/12 1155 06/10/12 1745  HGB 10.0* 9.5* 10.8* 11.0* 12.5 --  HCT 30.4* 27.8* 31.4* 32.5* 36.6 --  PLT 167 153 169 178 181 --  INR -- -- -- -- -- 0.93  APTT -- -- -- -- -- --   A:  Mild postop anemia. P:  Trend CBC [place] heparin  INFECTIOUS  Lab 06/15/12 0514 06/14/12 0500 06/13/12 0450 06/12/12 2137 06/12/12 0320 06/11/12 1155  WBC 6.6 9.7 13.9* -- 13.3* 7.0  PROCALCITON 12.97 25.60 -- 60.31 -- --   Cultures: 8/5  Blood x2  >>> 8/5 Urine > neg 8/6 Urine > neg 8/8 Respiratory: rare GPC in pairs> neg  Antibiotics: 8/6  Zosyn >>> 8/7  Vancomycin >>>  A:  Peritonitis.   PCT trending down/ no def pna P:   Added Vancomycin 8/7 due to resp gm stain > d/c'd 8/10 as no mrsa Continue zosyn for gi sepsis empirically Trend pct   ENDOCRINE  Lab 06/16/12 0415 06/15/12 2323 06/15/12 2018 06/15/12 1545 06/15/12 1147  GLUCAP 192* 158* 228* 191* 212*   A:  DM.  History of hyperthyroidism, not on treatment. P:   Holding preadmission  Janumet ssi protocol  NEUROLOGIC  A:  Acute encephalopathy (hypoxia, sedation).  Postop pain. P:   PRN analgesia     BEST PRACTICE / DISPOSITION Level of Care:  ICU Primary Service:  CCS Consultants:  PCCM Code Status:  Full Diet:  NPO / TNA DVT Px:  Heparin GI Px:  Protonix Skin Integrity:  Intact Social / Family:  Updated at bedside two daughters  The  patient is critically ill with multiple organ systems failure and requires high complexity decision making for assessment and support, frequent evaluation and titration of therapies, application of advanced monitoring technologies and extensive interpretation of multiple databases. Critical Care Time devoted to patient care services described in this note is 45 minutes.    Sandrea Hughs, MD Pulmonary and Critical Care Medicine [facility] Cell [phone]

## 2012-06-17 ENCOUNTER — Inpatient Hospital Stay (HOSPITAL_COMMUNITY): Payer: PRIVATE HEALTH INSURANCE

## 2012-06-17 LAB — MAGNESIUM: Magnesium: 1.7 mg/dL (ref 1.5–2.5)

## 2012-06-17 LAB — CULTURE, BLOOD (ROUTINE X 2): Culture: NO GROWTH

## 2012-06-17 LAB — COMPREHENSIVE METABOLIC PANEL
Alkaline Phosphatase: 63 U/L (ref 39–117)
BUN: 38 mg/dL — ABNORMAL HIGH (ref 6–23)
Calcium: 11.8 mg/dL — ABNORMAL HIGH (ref 8.4–10.5)
GFR calc Af Amer: 30 mL/min — ABNORMAL LOW (ref >90)
Glucose, Bld: 213 mg/dL — ABNORMAL HIGH (ref 70–99)
Total Protein: 5.2 g/dL — ABNORMAL LOW (ref 6.0–8.3)

## 2012-06-17 LAB — GLUCOSE, CAPILLARY
Glucose-Capillary: 201 mg/dL — ABNORMAL HIGH (ref 70–99)
Glucose-Capillary: 192 mg/dL — ABNORMAL HIGH (ref 70–99)
Glucose-Capillary: 142 mg/dL — ABNORMAL HIGH (ref 70–99)

## 2012-06-17 LAB — DIFFERENTIAL
Basophils Absolute: 0.1 10*3/uL (ref 0.0–0.1)
Basophils Relative: 1 % (ref 0–1)
Eosinophils Absolute: 0.2 10*3/uL (ref 0.0–0.7)
Lymphs Abs: 1.1 10*3/uL (ref 0.7–4.0)
Neutrophils Relative %: 74 % (ref 43–77)

## 2012-06-17 LAB — CBC
MCH: 30.1 pg (ref 26.0–34.0)
Platelets: 234 10*3/uL (ref 150–400)
RBC: 3.06 MIL/uL — ABNORMAL LOW (ref 3.87–5.11)

## 2012-06-17 LAB — TRIGLYCERIDES: Triglycerides: 98 mg/dL (ref <150)

## 2012-06-17 LAB — CHOLESTEROL, TOTAL: Cholesterol: 88 mg/dL (ref 0–200)

## 2012-06-17 MED ORDER — FENTANYL CITRATE 0.05 MG/ML IJ SOLN
25.0000 ug | INTRAMUSCULAR | Status: DC | PRN
  Administered 2012-06-18: 25 ug via INTRAVENOUS
  Administered 2012-06-19: 50 ug via INTRAVENOUS
  Administered 2012-06-19 – 2012-06-21 (×3): 25 ug via INTRAVENOUS
  Filled 2012-06-17 (×5): qty 2

## 2012-06-17 MED ORDER — FAT EMULSION 20 % IV EMUL
240.0000 mL | INTRAVENOUS | Status: AC
  Administered 2012-06-17: 240 mL via INTRAVENOUS
  Filled 2012-06-17: qty 250

## 2012-06-17 MED ORDER — ZINC TRACE METAL 1 MG/ML IV SOLN
INTRAVENOUS | Status: AC
  Administered 2012-06-17: 18:00:00 via INTRAVENOUS
  Filled 2012-06-17: qty 2000

## 2012-06-17 NOTE — Progress Notes (Signed)
Patient extubated at 09:45am withouy difficulty. Cuff test done-positive for air movement. All vitals remained stable and patient was placed on 40% venti post extubation.

## 2012-06-17 NOTE — Progress Notes (Signed)
Name: April [name] MRN: [id number] DOB: [birth year]    LOS: 7  Referring Provider:  [facility]S Reason for Referral:  Acute respiratory failure  PULMONARY / CRITICAL CARE MEDICINE   Brief patient description:  77 yo female smoker admitted 06/10/2012 abdominal pain and pneumoperitoneum from perforated pre-pyloric gastric ulcer.  Had emergent laparotomy with repair of gastric ulcer and omental patch on 8/05.  She developed acute hypoxic respiratory failure 8/07 and PCCM consulted. PMHx of CKD, DM, Hyperparathyroidism, HTN  Events Since Admission: 8/5  Admitted with gastric ulcer perforation, exploratory lap with ulcer repair 8/7  Developed respiratory distress, intubated > ext 8/9 am 8/9 pm resp distress, re-et  Current Status: Tolerating pressure support.  Off pressors.  Vital Signs: Temp:  [98.8 F (37.1 C)-100 F (37.8 C)] 98.8 F (37.1 C) (08/12 0900) Pulse Rate:  [74-92] 79  (08/12 0900) Resp:  [17-27] 21  (08/12 0900) BP: (82-141)/(35-59) 129/59 mmHg (08/12 0900) SpO2:  [93 %-100 %] 100 % (08/12 0900) FiO2 (%):  [40 %-50 %] 40 % (08/12 0857)  Intake/Output Summary (Last 24 hours) at 06/17/12 0928 Last data filed at 06/17/12 0900  Gross per 24 hour  Intake 3305.72 ml  Output   1975 ml  Net 1330.72 ml   CVP:  [3 mmHg-7 mmHg] 6 mmHg     Physical Examination: General:  No distress Neuro:  Follows simple commands HEENT:  NGT, OETT Neck:  supple Cardiovascular:  S1, s2 regular Lungs:  Decreased breath sounds, no wheeze Abdomen:  Wound dressing clean Musculoskeletal:  No edema Skin:  Intact  Dg Chest Port 1 View  06/16/2012  *RADIOLOGY REPORT*  Clinical Data: Respiratory distress  PORTABLE CHEST - 1 VIEW  Comparison: 06/15/2012  Findings: Endotracheal tube tip positioned 3.5 cm proximal to the carina.  Right sided PICC with catheter tip projecting over the distal SVC.  Aortic arch atherosclerosis.  Bibasilar opacities and small pleural effusions. Allowing for differences in  positioning/technique similar in size.  No interval osseous change. Mild thoracolumbar curvature.  NG tube descends into the abdomen, tip  likely over the gastric fundus.  IMPRESSION: Appropriately positioned support devices as above.  Small bilateral pleural effusions with associated bibasilar opacities; atelectasis, aspiration, or pneumonia.  Original Report Authenticated By: Waneta Martins, M.D.   Dg Chest Port 1 View  06/15/2012  *RADIOLOGY REPORT*  Clinical Data: Repositioning of endotracheal tube.  PORTABLE CHEST - 1 VIEW 06/15/2012 1030 hours:  Comparison: Portable chest x-ray yesterday.  Findings: Endotracheal tube tip now in satisfactory position projecting approximately 6 cm above the carina.  Nasogastric tube courses below the diaphragm into the stomach.  Right arm PICC tip projects over the mid SVC, unchanged.  Dense consolidation in the lower lobes and bilateral effusions, right greater than left, unchanged.  No new pulmonary parenchymal abnormality.  Cardiac silhouette normal in size for technique.  Pulmonary vascularity normal.  IMPRESSION:  1.  Endotracheal tube tip now in satisfactory position projecting approximately 6 cm above the carina. 2.  Remaining support apparatus satisfactory. 3.  Stable bilateral pleural effusions, right greater than left, and associated dense passive atelectasis and/or pneumonia in the lower lobes.  No new abnormalities.  Original Report Authenticated By: Arnell Sieving, M.D.    ASSESSMENT AND PLAN  PULMONARY  Lab 06/16/12 0352 06/12/12 2204  PHART 7.384 7.390  PCO2ART 40.8 30.1*  PO2ART 80.3 89.0  HCO3 23.6 17.8*  O2SAT 94.9 96.9    Ventilator Settings: Vent Mode:  [-] PRVC FiO2 (%):  [  40 %-50 %] 40 % Set Rate:  [14 bmp] 14 bmp Vt Set:  [400 mL] 400 mL PEEP:  [5 cmH20-10 cmH20] 5 cmH20 Plateau Pressure:  [13 cmH20-15 cmH20] 15 cmH20  ETT:  8/7 > 8/9 ETT 8/9 >>>  8/05 CT chest>>diffuse atherosclerosis, diffuse centrilobular emphysema,  basilar ATX, spinal DJD with mild scoliosis, 7 mm LUL nodule  A:  Acute hypoxemic respiratory failure likely from pulmonary edema in setting of probable COPD, and decreased abd compliance after laparotomy. P:   Proceed with extubation 8/12 Continue scheduled bronchodilators F/u CXR Keep in negative fluid balance Bronchial hygiene BPAP prn after extubation Will need outpt f/u for Lt upper lobe nodule  CARDIOVASCULAR  Lab 06/13/12 1115 06/13/12 0857 06/13/12 0856 06/13/12 0450 06/12/12 2138 06/10/12 1810  TROPONINI -- -- <0.30 <0.30 <0.30 --  LATICACIDVEN -- 0.8 -- 0.7 0.7 4.0*  PROBNP 463.8* -- -- -- -- --   Lines: R PICC 8/6 >>>  2 d echo 8/8: EF 55 to 60%, grade 1 diastolic dysfx  A: Shock>>resolved.  Likely acute on chronic diastolic dysfx with acute pulmonary edema>>improved.  Hx of HTN.  Diffuse atherosclerosis on CT chest. P:  Monitor CVP qshift>>goal 6 to 8 Will likely need cardiology evaluation at some point for CAD  RENAL  Lab 06/17/12 0510 06/16/12 0513 06/15/12 0514 06/14/12 0500 06/13/12 1450 06/13/12 0450 06/12/12 0320 06/11/12 1155  NA 140 139 140 144 145 -- -- --  K 4.5 3.8 -- -- -- -- -- --  CL 110 108 107 115* 116* -- -- --  CO2 24 23 26 21 19  -- -- --  BUN 38* 35* 30* 27* 27* -- -- --  CREATININE 1.82* 1.86* 1.93* 1.94* 2.08* -- -- --  CALCIUM 11.8* 10.8* 10.4 9.3 8.9 -- -- --  MG 1.7 -- -- 1.7 -- 1.8 1.2* 1.2*  PHOS 3.7 -- -- 2.9 -- 2.8 -- --   Intake/Output      08/11 0701 - 08/12 0700 08/12 0701 - 08/13 0700   I.V. (mL/kg) 1876 (32.3) 152.8 (2.6)   IV Piggyback 160    TPN 1200 100   Total Intake(mL/kg) 3236 (55.8) 252.8 (4.4)   Urine (mL/kg/hr) 1315 (0.9)    Emesis/NG output 800    Drains 35    Stool 0    Total Output 2150    Net +1086 +252.8        Stool Occurrence      Foley:  8/5 >>>  A: Acute renal failure with hx of CKD.  Baseline creatinine 1.85 (followed by Dr. Lowell Guitar as outpt). P: F/u renal fx, electrolytes Monitor urine  outpt  GASTROINTESTINAL  Lab 06/17/12 0510 06/15/12 0514 06/14/12 0500 06/13/12 0900 06/13/12 0450  AST 20 20 21 19 19   ALT 17 20 19 21 24   ALKPHOS 63 67 106 54 57  BILITOT 0.3 0.3 0.4 0.3 0.3  PROT 5.2* 5.3* 4.7* 4.6* 4.7*  ALBUMIN 1.7* 1.8* 1.6* 1.7* 1.7*   A:  Perforated gastric ulcer s/p repair. P:   Post-op care per CCS TNA for nutrition until able to use gut>>defer timing to surgery Protonix q12h  HEMATOLOGIC  Lab 06/17/12 0510 06/15/12 0514 06/14/12 0500 06/13/12 0450 06/12/12 0320 06/10/12 1745  HGB 9.2* 10.0* 9.5* 10.8* 11.0* --  HCT 28.2* 30.4* 27.8* 31.4* 32.5* --  PLT 234 167 153 169 178 --  INR -- -- -- -- -- 0.93  APTT -- -- -- -- -- --   A:  Anemia  of critical illness. P:  F/u CBC Transfuse for Hb < 7  INFECTIOUS  Lab 06/17/12 0510 06/15/12 0514 06/14/12 0500 06/13/12 0450 06/12/12 2137 06/12/12 0320  WBC 8.7 6.6 9.7 13.9* -- 13.3*  PROCALCITON 3.07 12.97 25.60 -- 60.31 --   Cultures: 8/5  Blood x2>>negative 8/5 Urine >>negative 8/6 Urine >>negative 8/8 Respiratory>>oral flora  Antibiotics:  Ertapenem 8/5>>8/5 8/5  Zosyn >>> 8/7  Vancomycin >>>8/9  A:  Peritonitis for perforated gastric ulcer. P:   Continue zosyn per GI>>Day 8/x   ENDOCRINE  Lab 06/17/12 0734 06/17/12 0338 06/17/12 0004 06/16/12 2043 06/16/12 1548  GLUCAP 192* 201* 197* 193* 210*   A:  DM type II with hyperglycemia.  History of hyperthyroidism, not on treatment. P:   SSI May need input from renal or endocrine if calcium remains persistently elevated  NEUROLOGIC  A:  Acute encephalopathy (hypoxia, sedation).  Postop pain. P:   PRN fentanyl after extubation    BEST PRACTICE / DISPOSITION Level of Care:  ICU Primary Service:  CCS Consultants:  PCCM Code Status:  Full Diet:  NPO / TNA DVT Px:  Heparin GI Px:  Protonix Skin Integrity:  Intact Social / Family:  No family at bedside  Critical care time 35 minutes.  Coralyn Helling, MD [facility] Pulmonary/Critical  Care 06/17/2012, 9:28 AM Pager:  [phone] After 3pm call: [phone]

## 2012-06-17 NOTE — Progress Notes (Signed)
Patient ID: April [name], female   DOB: Mar 06, 1935, 77 y.o.   MRN: [id number] 7 Days Post-Op  Subjective: Pt still intubated and currently sedated, but meds turned town.  Off Levophed.  Small BM on Saturday night, but none since then.  Still with 300-600cc NGT output daily per RN.  Objective: Vital signs in last 24 hours: Temp:  [98.6 F (37 C)-100 F (37.8 C)] 99.5 F (37.5 C) (08/12 0600) Pulse Rate:  [79-92] 79  (08/12 0600) Resp:  [15-27] 23  (08/12 0600) BP: (82-141)/(35-58) 103/46 mmHg (08/12 0600) SpO2:  [93 %-100 %] 100 % (08/12 0600) FiO2 (%):  [40 %-60 %] 40 % (08/12 0600) Last BM Date: 06/15/12  Intake/Output from previous day: 08/11 0701 - 08/12 0700 In: 3105.5 [I.V.:1795.5; IV Piggyback:160; TPN:1150] Out: 2070 [Urine:1235; Emesis/NG output:800; Drains:35] Intake/Output this shift:    PE: Abd: soft, wound is clean and packed.  JP with serous output, no bilious drainage.  -BS Heart: regular Lungs: clear  Lab Results:   Basename 06/17/12 0510 06/15/12 0514  WBC 8.7 6.6  HGB 9.2* 10.0*  HCT 28.2* 30.4*  PLT 234 167   BMET  Basename 06/17/12 0510 06/16/12 0513  NA 140 139  K 4.5 3.8  CL 110 108  CO2 24 23  GLUCOSE 213* 214*  BUN 38* 35*  CREATININE 1.82* 1.86*  CALCIUM 11.8* 10.8*   PT/INR No results found for this basename: LABPROT:2,INR:2 in the last 72 hours CMP     Component Value Date/Time   NA 140 06/17/2012 0510   K 4.5 06/17/2012 0510   CL 110 06/17/2012 0510   CO2 24 06/17/2012 0510   GLUCOSE 213* 06/17/2012 0510   BUN 38* 06/17/2012 0510   CREATININE 1.82* 06/17/2012 0510   CALCIUM 11.8* 06/17/2012 0510   PROT 5.2* 06/17/2012 0510   ALBUMIN 1.7* 06/17/2012 0510   AST 20 06/17/2012 0510   ALT 17 06/17/2012 0510   ALKPHOS 63 06/17/2012 0510   BILITOT 0.3 06/17/2012 0510   GFRNONAA 26* 06/17/2012 0510   GFRAA 30* 06/17/2012 0510   Lipase  No results found for this basename: lipase       Studies/Results: Dg Chest Port 1 View  06/16/2012   *RADIOLOGY REPORT*  Clinical Data: Respiratory distress  PORTABLE CHEST - 1 VIEW  Comparison: 06/15/2012  Findings: Endotracheal tube tip positioned 3.5 cm proximal to the carina.  Right sided PICC with catheter tip projecting over the distal SVC.  Aortic arch atherosclerosis.  Bibasilar opacities and small pleural effusions. Allowing for differences in positioning/technique similar in size.  No interval osseous change. Mild thoracolumbar curvature.  NG tube descends into the abdomen, tip  likely over the gastric fundus.  IMPRESSION: Appropriately positioned support devices as above.  Small bilateral pleural effusions with associated bibasilar opacities; atelectasis, aspiration, or pneumonia.  Original Report Authenticated By: Waneta Martins, M.D.   Dg Chest Port 1 View  06/15/2012  *RADIOLOGY REPORT*  Clinical Data: Repositioning of endotracheal tube.  PORTABLE CHEST - 1 VIEW 06/15/2012 1030 hours:  Comparison: Portable chest x-ray yesterday.  Findings: Endotracheal tube tip now in satisfactory position projecting approximately 6 cm above the carina.  Nasogastric tube courses below the diaphragm into the stomach.  Right arm PICC tip projects over the mid SVC, unchanged.  Dense consolidation in the lower lobes and bilateral effusions, right greater than left, unchanged.  No new pulmonary parenchymal abnormality.  Cardiac silhouette normal in size for technique.  Pulmonary vascularity normal.  IMPRESSION:  1.  Endotracheal tube tip now in satisfactory position projecting approximately 6 cm above the carina. 2.  Remaining support apparatus satisfactory. 3.  Stable bilateral pleural effusions, right greater than left, and associated dense passive atelectasis and/or pneumonia in the lower lobes.  No new abnormalities.  Original Report Authenticated By: Arnell Sieving, M.D.    Anti-infectives: Anti-infectives     Start     Dose/Rate Route Frequency Ordered Stop   06/13/12 2200   vancomycin (VANCOCIN) 500  mg in sodium chloride 0.9 % 100 mL IVPB  Status:  Discontinued        500 mg 100 mL/hr over 60 Minutes Intravenous Every 24 hours 06/12/12 2218 06/15/12 0840   06/12/12 2230   vancomycin (VANCOCIN) 750 mg in sodium chloride 0.9 % 150 mL IVPB        750 mg 150 mL/hr over 60 Minutes Intravenous  Once 06/12/12 2218 06/12/12 2330   06/11/12 1800  piperacillin-tazobactam (ZOSYN) IVPB 3.375 g       3.375 g 12.5 mL/hr over 240 Minutes Intravenous Every 8 hours 06/11/12 1343     06/11/12 0200   piperacillin-tazobactam (ZOSYN) IVPB 3.375 g  Status:  Discontinued        3.375 g 12.5 mL/hr over 240 Minutes Intravenous Every 8 hours 06/11/12 0133 06/11/12 1341   06/10/12 1945   ertapenem (INVANZ) 1 g in sodium chloride 0.9 % 50 mL IVPB        1 g 100 mL/hr over 30 Minutes Intravenous  Once 06/10/12 1930 06/10/12 2023           Assessment/Plan  1. S/p ex lap with oversew and omental patching of gastric ulcer Patient Active Problem List  Diagnosis  . HTN (hypertension)  . Hyperlipidemia  . Benign tumor of pituitary gland  . Polyp of colon  . Hyperparathyroidism, primary  . DM (diabetes mellitus)  . Hypercalcemia  . CRF (chronic renal failure)  . Perforated gastric ulcer  . Lung nodule  . Hypomagnesemia  . Leukocytosis  . COPD (chronic obstructive pulmonary disease)  . Acute respiratory failure with hypoxia  . Acute encephalopathy  . Aspiration pneumonia  PCM/TNA  Plan: 1. VDRF per CCM, appreciate their assistance with this. 2. Cont TNA for nutrition 3. Cont NPO and NGT for now due to continued post-op ileus 4. CBGs between 150-200, managed per pharmacy due to TNA 5. Renal function improving 6. Continue abx therapy.   LOS: 7 days    Alp Goldwater E 06/17/2012

## 2012-06-17 NOTE — Progress Notes (Addendum)
PARENTERAL NUTRITION CONSULT NOTE - Follow up  Pharmacy Consult for TNA Indication: s/p gastric surgery, PCM.  No Known Allergies  Patient Measurements: Height: 5\' 2"  (157.5 cm) Weight: 127 lb 13.9 oz (58 kg) IBW/kg (Calculated) : 50.1   Vital Signs: Temp: 99.5 F (37.5 C) (08/12 0600) Temp src: Core (Comment) (08/12 0410) BP: 103/46 mmHg (08/12 0600) Pulse Rate: 79  (08/12 0600) Intake/Output from previous day: 08/11 0701 - 08/12 0700 In: 3105.5 [I.V.:1795.5; IV Piggyback:160; TPN:1150] Out: 2070 [Urine:1235; Emesis/NG output:800; Drains:35] Intake/Output from this shift:    Labs:  [facility] 06/17/12 0510 06/15/12 0514  WBC 8.7 6.6  HGB 9.2* 10.0*  HCT 28.2* 30.4*  PLT 234 167  APTT -- --  INR -- --    Basename 06/17/12 0510 06/16/12 0513 06/15/12 0514  NA 140 139 140  K 4.5 3.8 3.3*  CL 110 108 107  CO2 24 23 26   GLUCOSE 213* 214* 201*  BUN 38* 35* 30*  CREATININE 1.82* 1.86* 1.93*  LABCREA -- -- --  CREAT24HRUR -- -- --  CALCIUM 11.8* 10.8* 10.4  MG 1.7 -- --  PHOS 3.7 -- --  PROT 5.2* -- 5.3*  ALBUMIN 1.7* -- 1.8*  AST 20 -- 20  ALT 17 -- 20  ALKPHOS 63 -- 67  BILITOT 0.3 -- 0.3  BILIDIR -- -- --  IBILI -- -- --  PREALBUMIN -- -- --  TRIG 98 -- --  CHOLHDL -- -- --  CHOL 88 -- --   Estimated Creatinine Clearance: 21.4 ml/min (by C-G formula based on Cr of 1.82).    Basename 06/17/12 0338 06/17/12 0004 06/16/12 2043  GLUCAP 201* 197* 193*    Medical History: Past Medical History  Diagnosis Date  . Diabetes mellitus   . Hyperthyroidism   . Hypertension     Medications:  Scheduled:     . ipratropium  0.5 mg Nebulization Q6H   And  . albuterol  2.5 mg Nebulization Q6H  . antiseptic oral rinse  15 mL Mouth Rinse QID  . chlorhexidine  15 mL Mouth Rinse BID  . heparin  5,000 Units Subcutaneous Q8H  . insulin aspart  0-15 Units Subcutaneous Q4H  . pantoprazole (PROTONIX) IV  40 mg Intravenous Q12H  . piperacillin-tazobactam (ZOSYN)   IV  3.375 g Intravenous Q8H  . sodium chloride  10-40 mL Intracatheter Q12H   Nutritional Goals:  per RD: 1250-1550Kcal/d and protein 69-82g/d. Clinimix E 5/20 at goal rate 56ml/hr + lipids at 79ml/hr on MWF will provide:  72/day protein and 1746Kcal/day MWF, 1267Kcal/day STTHS(Avg. 1473Kcal/day weekly).  Current nutrition:  NPO Climimix E 5/20 @50ml /hr MIVF: NS @ 75 ml/hr  CBGs & Insulin requirements past 24 hours: CBG 193, 197, 201 (since new bag of TNA hung with 30 units insulin per 2L bag) Moderate SSI q4h (29 units used)  Labs:   Electrolytes - Corr Ca 13.6 - high, trending up. Other lytes WNL.  SCr remains elevated, slow trend down (chronic renal insufficiency, baseline Scr unknown)  LFTs - wnl  TGs - wnl  Prealbumin 10.3 (8/8)  Assessment:  76yo F s/p repair of perforated prepyloric ulcer on 06/10/12. TNA started 8/7 d/t need for bowel rest.  Remains on TNA for continued post-op ileus.  Continued NG output --> 800 mL recorded yesterday.  Required intubation 06/12/12 d/t pulmonary edema. Weaned off of propofol 8/8 (provided 1.1 Kcal/ml)  Nutritional goals adjusted for renal insufficiency and to provide less fluid.  Plan:  Will adjust TNA  to electrolyte free formula (Clinimix 5/15) due to increasing Ca.  At 1800 today, start Clinimix 5/15 @ 34ml/hr.   Will increase insulin in TNA to 50 units per 2L bag.  Estimated that patient will receive ~ 30 units over 24 hours at current TNA rate of 50 ml/hr.  Conservative increase considering change to 5/15 formulation at same infusion rate provides less kcal.  Fat emulsion at 29ml/hr (MWF only due to ongoing shortage).   MIVF per MD.  TNA to contain standard multivitamins and trace elements(MWF only due to ongoing shortage).  TNA lab panels on Mondays & Thursdays.  BMET in AM.  Clance Boll, PharmD, BCPS Pager: [phone] 06/17/2012 8:03 AM

## 2012-06-17 NOTE — Progress Notes (Signed)
CARE MANAGE MENT UTILIZATION REVIEW NOTE 06/17/2012     Patient:  April [name], April [name]   Account Number:  [id number]  Documented by:  [email or web address] DAVIS   Per Ur Regulation Reviewed for med. necessity/level of care/duration of stay

## 2012-06-17 NOTE — Progress Notes (Signed)
Patient has had to be naso-tracheal suctioned three times since extubation. Patient has strong cough but is not easily arousable

## 2012-06-17 NOTE — Progress Notes (Signed)
Patient was extubated by the time I came by to see her at 0945. She seems to be without respiratory distress. Exam was as noted by KO,PA.her abdomen seemed soft. There are a few bowel sounds. No guarding or rigidity. There is bile coming out the NG tube.  If she does well today, is able to remain extubated, we will obtain a Gastrografin study through her NG tube tomorrow and hopefully remove it if that looks okay.

## 2012-06-18 ENCOUNTER — Inpatient Hospital Stay (HOSPITAL_COMMUNITY): Payer: PRIVATE HEALTH INSURANCE

## 2012-06-18 DIAGNOSIS — R911 Solitary pulmonary nodule: Secondary | ICD-10-CM

## 2012-06-18 LAB — BASIC METABOLIC PANEL
CO2: 23 mEq/L (ref 19–32)
Calcium: 11.7 mg/dL — ABNORMAL HIGH (ref 8.4–10.5)
Chloride: 111 mEq/L (ref 96–112)
Creatinine, Ser: 1.68 mg/dL — ABNORMAL HIGH (ref 0.50–1.10)
Glucose, Bld: 131 mg/dL — ABNORMAL HIGH (ref 70–99)

## 2012-06-18 LAB — GLUCOSE, CAPILLARY
Glucose-Capillary: 163 mg/dL — ABNORMAL HIGH (ref 70–99)
Glucose-Capillary: 124 mg/dL — ABNORMAL HIGH (ref 70–99)
Glucose-Capillary: 116 mg/dL — ABNORMAL HIGH (ref 70–99)
Glucose-Capillary: 117 mg/dL — ABNORMAL HIGH (ref 70–99)
Glucose-Capillary: 142 mg/dL — ABNORMAL HIGH (ref 70–99)

## 2012-06-18 MED ORDER — INSULIN REGULAR HUMAN 100 UNIT/ML IJ SOLN
INTRAMUSCULAR | Status: AC
  Administered 2012-06-18: 17:00:00 via INTRAVENOUS
  Filled 2012-06-18: qty 2000

## 2012-06-18 MED ORDER — IOHEXOL 300 MG/ML  SOLN: 85.0000 mL | Freq: Once | INTRAMUSCULAR | Status: AC | PRN

## 2012-06-18 NOTE — Progress Notes (Signed)
PARENTERAL NUTRITION CONSULT NOTE - Follow up  Pharmacy Consult for TNA Indication: s/p gastric surgery, PCM.  No Known Allergies  Patient Measurements: Height: 5\' 2"  (157.5 cm) Weight: 128 lb 1.4 oz (58.1 kg) IBW/kg (Calculated) : 50.1   Vital Signs: Temp: 99.1 F (37.3 C) (08/13 0500) BP: 137/54 mmHg (08/13 0500) Pulse Rate: 78  (08/13 0500) Intake/Output from previous day: 08/12 0701 - 08/13 0700 In: 2669.8 [I.V.:1224.8; IV Piggyback:110; TPN:1335] Out: 2635 [Urine:1710; Emesis/NG output:900; Drains:25] Intake/Output from this shift:    Labs:  [facility] 06/17/12 0510  WBC 8.7  HGB 9.2*  HCT 28.2*  PLT 234  APTT --  INR --    Basename 06/18/12 0545 06/17/12 0510 06/16/12 0513  NA 141 140 139  K 3.8 4.5 3.8  CL 111 110 108  CO2 23 24 23   GLUCOSE 131* 213* 214*  BUN 36* 38* 35*  CREATININE 1.68* 1.82* 1.86*  LABCREA -- -- --  CREAT24HRUR -- -- --  CALCIUM 11.7* 11.8* 10.8*  MG -- 1.7 --  PHOS -- 3.7 --  PROT -- 5.2* --  ALBUMIN -- 1.7* --  AST -- 20 --  ALT -- 17 --  ALKPHOS -- 63 --  BILITOT -- 0.3 --  BILIDIR -- -- --  IBILI -- -- --  PREALBUMIN -- 14.2* --  TRIG -- 98 --  CHOLHDL -- -- --  CHOL -- 88 --   Estimated Creatinine Clearance: 23.2 ml/min (by C-G formula based on Cr of 1.68).    Basename 06/18/12 0353 06/18/12 0005 06/17/12 2006  GLUCAP 140* 118* 142*    Medical History: Past Medical History  Diagnosis Date  . Diabetes mellitus   . Hyperthyroidism   . Hypertension     Medications:  Scheduled:     . ipratropium  0.5 mg Nebulization Q6H   And  . albuterol  2.5 mg Nebulization Q6H  . antiseptic oral rinse  15 mL Mouth Rinse QID  . chlorhexidine  15 mL Mouth Rinse BID  . heparin  5,000 Units Subcutaneous Q8H  . insulin aspart  0-15 Units Subcutaneous Q4H  . pantoprazole (PROTONIX) IV  40 mg Intravenous Q12H  . piperacillin-tazobactam (ZOSYN)  IV  3.375 g Intravenous Q8H  . sodium chloride  10-40 mL Intracatheter Q12H    Nutritional Goals:  per RD: 1250-1550Kcal/d and protein 69-82g/d. Clinimix E 5/20 at goal rate 44ml/hr + lipids at 66ml/hr on MWF will provide:  72/day protein and 1746Kcal/day MWF, 1267Kcal/day STTHS(Avg. 1473Kcal/day weekly).  Current nutrition:  NPO Climimix 5/15 @50ml /hr MIVF: NS @ 75 ml/hr  CBGs & Insulin requirements past 24 hours: CBG 142, 118, 140 (since new bag of TNA hung with 50 units insulin per 2L bag) Moderate SSI q4h (13 units used)  Labs:   Electrolytes - Corr Ca 13.5. Other lytes WNL.  SCr remains elevated, slowly trending down (chronic renal insufficiency, baseline Scr unknown)  LFTs - wnl  TGs - wnl  Prealbumin improving - 14.2 (8/12), 10.3 (8/8)  Assessment:  [name] s/p repair of perforated prepyloric ulcer on 06/10/12. TNA started 8/7 d/t need for bowel rest.  Remains on TNA for continued post-op ileus.  Continued NG output remains high --> 900 mL recorded yesterday.  Required intubation 06/12/12 d/t pulmonary edema. Weaned off of propofol 8/8 (provided 1.1 Kcal/ml)  Nutritional goals adjusted for renal insufficiency and to provide less fluid.  Plan:  Continue electrolyte free formula (Clinimix 5/15) due to increasing Ca.  At 1800 today, increase Clinimix  5/15 to 65 ml/hr to meet nutrition goals.  Clinimix 5/15 at goal rate 65 ml/hr + lipids at 67ml/hr on MWF will provide:  78g/day protein and 1587 Kcal/day MWF, 1107 Kcal/day STTHS (Avg. 1312 Kcal/day weekly).  Continue with insulin in TNA at 50 units per 2L bag.  Estimated that patient will receive ~39 units over 24 hours at new TNA rate of 65 ml/hr.    Fat emulsion at 12ml/hr (MWF only due to ongoing shortage).   MIVF per MD.  TNA to contain standard multivitamins and trace elements(MWF only due to ongoing shortage).  TNA lab panels on Mondays & Thursdays.  BMET in AM to f/u SCr and Ca.  Clance Boll, PharmD, BCPS Pager: [phone] 06/18/2012 7:07 AM

## 2012-06-18 NOTE — Progress Notes (Signed)
8 Days Post-Op  Subjective: Extubated, no apparent abd c/o. No BM yet  Objective: Vital signs in last 24 hours: Temp:  [98.8 F (37.1 C)-100.2 F (37.9 C)] 99.3 F (37.4 C) (08/13 0700) Pulse Rate:  [68-88] 68  (08/13 0700) Resp:  [21-30] 21  (08/13 0700) BP: (102-137)/(41-62) 126/50 mmHg (08/13 0700) SpO2:  [90 %-99 %] 97 % (08/13 0845) FiO2 (%):  [35 %-100 %] 40 % (08/13 0845) Weight:  [128 lb 1.4 oz (58.1 kg)] 128 lb 1.4 oz (58.1 kg) (08/13 0000)   Intake/Output from previous day: 08/12 0701 - 08/13 0700 In: 2779.8 [I.V.:1274.8; IV Piggyback:110; TPN:1395] Out: 2635 [Urine:1710; Emesis/NG output:900; Drains:25] Intake/Output this shift: Total I/O In: 110 [I.V.:50; TPN:60] Out: 60 [Urine:60]   General appearance: fatigued, mild distress and slow to respond to questions Resp: clear to auscultation bilaterally and seems a little dyspneic GI: Remains a little distended, mildly tender througout, but sof and no rebound. Few BS  Incision: Wound clean and healing. About 35 cc out JP  Lab Results:   [facility] 06/17/12 0510  WBC 8.7  HGB 9.2*  HCT 28.2*  PLT 234   BMET  Basename 06/18/12 0545 06/17/12 0510  NA 141 140  K 3.8 4.5  CL 111 110  CO2 23 24  GLUCOSE 131* 213*  BUN 36* 38*  CREATININE 1.68* 1.82*  CALCIUM 11.7* 11.8*   PT/INR No results found for this basename: LABPROT:2,INR:2 in the last 72 hours ABG  Basename 06/16/12 0352  PHART 7.384  HCO3 23.6    MEDS, Scheduled    . ipratropium  0.5 mg Nebulization Q6H   And  . albuterol  2.5 mg Nebulization Q6H  . antiseptic oral rinse  15 mL Mouth Rinse QID  . chlorhexidine  15 mL Mouth Rinse BID  . heparin  5,000 Units Subcutaneous Q8H  . insulin aspart  0-15 Units Subcutaneous Q4H  . pantoprazole (PROTONIX) IV  40 mg Intravenous Q12H  . piperacillin-tazobactam (ZOSYN)  IV  3.375 g Intravenous Q8H  . sodium chloride  10-40 mL Intracatheter Q12H    Studies/Results: Dg Chest Port 1  View  06/17/2012  *RADIOLOGY REPORT*  Clinical Data: Respiratory distress  PORTABLE CHEST - 1 VIEW  Comparison: Yesterday  Findings: Endotracheal tube removed.  NG tube and right PICC stable.  Low volumes with increased hazy bibasilar airspace disease versus volume loss. No pneumothorax. Pleural effusions are not excluded.  IMPRESSION: Extubated.  Increased bibasilar airspace disease versus volume loss.  Original Report Authenticated By: Donavan Burnet, M.D.    Assessment: s/p Procedure(s): EXPLORATORY LAPAROTOMY Patient Active Problem List  Diagnosis  . HTN (hypertension)  . Hyperlipidemia  . Benign tumor of pituitary gland  . Polyp of colon  . Hyperparathyroidism, primary  . DM (diabetes mellitus)  . Hypercalcemia  . CRF (chronic renal failure)  . Perforated gastric ulcer  . Lung nodule  . Hypomagnesemia  . Leukocytosis  . COPD (chronic obstructive pulmonary disease)  . Acute respiratory failure with hypoxia  . Acute encephalopathy  . Aspiration pneumonia    Slow progress  Plan: Would like to get gastrograffin UGI to see that there is no leak from repair and there is no GOO. If OK could remove N/G or potentiallly use to start enteric feedings.   LOS: 8 days     Currie Paris, MD, [facility], [place] [phone]   06/18/2012 9:10 AM

## 2012-06-18 NOTE — Progress Notes (Signed)
Name: SHERRON MAPP MRN: [id number] DOB: 12-Aug-1935    LOS: 8  Referring Provider:  CCS Reason for Referral:  Acute respiratory failure  PULMONARY / CRITICAL CARE MEDICINE   Brief patient description:  77 yo female smoker admitted 06/10/2012 abdominal pain and pneumoperitoneum from perforated pre-pyloric gastric ulcer.  Had emergent laparotomy with repair of gastric ulcer and omental patch on 8/05.  She developed acute hypoxic respiratory failure 8/07 and PCCM consulted. PMHx of CKD, DM, Hyperparathyroidism, HTN  Events Since Admission: 8/5  Admitted with gastric ulcer perforation, exploratory lap with ulcer repair 8/7  Developed respiratory distress, intubated > ext 8/9 am 8/9 pm resp distress, re-et  Current Status: More awake, still weak, today has a cough but still very weak. Still requiring intermittent NTS.   Vital Signs: Temp:  [98.8 F (37.1 C)-100.2 F (37.9 C)] 99.3 F (37.4 C) (08/13 0700) Pulse Rate:  [68-88] 68  (08/13 0700) Resp:  [21-30] 21  (08/13 0700) BP: (102-137)/(41-62) 126/50 mmHg (08/13 0700) SpO2:  [90 %-99 %] 97 % (08/13 0845) FiO2 (%):  [35 %-100 %] 40 % (08/13 0845) Weight:  [58.1 kg (128 lb 1.4 oz)] 58.1 kg (128 lb 1.4 oz) (08/13 0000)  Intake/Output Summary (Last 24 hours) at 06/18/12 0925 Last data filed at 06/18/12 0800  Gross per 24 hour  Intake 2637.08 ml  Output   2695 ml  Net -57.92 ml  50 % venturi mask  CVP:  [3 mmHg] 3 mmHg     Physical Examination: General:  No distress, weak and frail  Neuro:  Follows simple commands, speech soft.  HEENT:  NGT, no JVD Neck:  supple Cardiovascular:  S1, s2 regular Lungs:  Scattered rhonchi.  Abdomen:  Wound dressing clean, JP intact  Musculoskeletal:  No edema Skin:  Intact  Dg Chest Port 1 View  06/17/2012  *RADIOLOGY REPORT*  Clinical Data: Respiratory distress  PORTABLE CHEST - 1 VIEW  Comparison: Yesterday  Findings: Endotracheal tube removed.  NG tube and right PICC stable.  Low volumes  with increased hazy bibasilar airspace disease versus volume loss. No pneumothorax. Pleural effusions are not excluded.  IMPRESSION: Extubated.  Increased bibasilar airspace disease versus volume loss.  Original Report Authenticated By: Donavan Burnet, M.D.    ASSESSMENT AND PLAN  PULMONARY  Lab 06/16/12 0352 06/12/12 2204  PHART 7.384 7.390  PCO2ART 40.8 30.1*  PO2ART 80.3 89.0  HCO3 23.6 17.8*  O2SAT 94.9 96.9    Ventilator Settings: Vent Mode:  [-]  FiO2 (%):  [35 %-100 %] 40 %  ETT:  8/7 > 8/9 ETT 8/9 >>>8/12  8/05 CT chest>>diffuse atherosclerosis, diffuse centrilobular emphysema, basilar ATX, spinal DJD with mild scoliosis, 7 mm LUL nodule  A:  Acute hypoxemic respiratory failure likely from pulmonary edema in setting of probable COPD, and decreased abd compliance after laparotomy. Poor cough mechanics and P-lap associated atelectasis remain physiological barriers to progress.  P:   Continue scheduled bronchodilators Bronchial hygiene BPAP prn Will need outpt f/u for Lt upper lobe nodule  CARDIOVASCULAR  Lab 06/13/12 1115 06/13/12 0857 06/13/12 0856 06/13/12 0450 06/12/12 2138  TROPONINI -- -- <0.30 <0.30 <0.30  LATICACIDVEN -- 0.8 -- 0.7 0.7  PROBNP 463.8* -- -- -- --   Lines: R PICC 8/6 >>>  2 d echo 8/8: EF 55 to 60%, grade 1 diastolic dysfx  A: Shock>>resolved.  Likely acute on chronic diastolic dysfx with acute pulmonary edema>>improved.  Hx of HTN.  Diffuse atherosclerosis on CT chest. P:  Will likely need cardiology evaluation at some point for CAD  RENAL  Lab 06/18/12 0545 06/17/12 0510 06/16/12 0513 06/15/12 0514 06/14/12 0500 06/13/12 0450 06/12/12 0320 06/11/12 1155  NA 141 140 139 140 144 -- -- --  K 3.8 4.5 -- -- -- -- -- --  CL 111 110 108 107 115* -- -- --  CO2 23 24 23 26 21  -- -- --  BUN 36* 38* 35* 30* 27* -- -- --  CREATININE 1.68* 1.82* 1.86* 1.93* 1.94* -- -- --  CALCIUM 11.7* 11.8* 10.8* 10.4 9.3 -- -- --  MG -- 1.7 -- -- 1.7 1.8  1.2* 1.2*  PHOS -- 3.7 -- -- 2.9 2.8 -- --   Intake/Output      08/12 0701 - 08/13 0700 08/13 0701 - 08/14 0700   I.V. (mL/kg) 1274.8 (21.9) 50 (0.9)   IV Piggyback 110    TPN 1395 60   Total Intake(mL/kg) 2779.8 (47.8) 110 (1.9)   Urine (mL/kg/hr) 1710 (1.2) 60   Emesis/NG output 900    Drains 25    Total Output 2635 60   Net +144.8 +50         Foley:  8/5 >>>  A: Acute renal failure with hx of CKD.  Baseline creatinine 1.85 (followed by Dr. Lowell Guitar as outpt). P: F/u renal fx, electrolytes Monitor urine outpt Keep even volume status at this point  GASTROINTESTINAL  Lab 06/17/12 0510 06/15/12 0514 06/14/12 0500 06/13/12 0900 06/13/12 0450  AST 20 20 21 19 19   ALT 17 20 19 21 24   ALKPHOS 63 67 106 54 57  BILITOT 0.3 0.3 0.4 0.3 0.3  PROT 5.2* 5.3* 4.7* 4.6* 4.7*  ALBUMIN 1.7* 1.8* 1.6* 1.7* 1.7*   A:  Perforated gastric ulcer s/p repair. P:   Post-op care per [facility] TNA for nutrition until able to use gut>>defer timing to surgery Protonix q12h  HEMATOLOGIC  Lab 06/17/12 0510 06/15/12 0514 06/14/12 0500 06/13/12 0450 06/12/12 0320  HGB 9.2* 10.0* 9.5* 10.8* 11.0*  HCT 28.2* 30.4* 27.8* 31.4* 32.5*  PLT 234 167 153 169 178  INR -- -- -- -- --  APTT -- -- -- -- --   A:  Anemia of critical illness. P:  F/u CBC Transfuse for Hb < 7  INFECTIOUS  Lab 06/17/12 0510 06/15/12 0514 06/14/12 0500 06/13/12 0450 06/12/12 2137 06/12/12 0320  WBC 8.7 6.6 9.7 13.9* -- 13.3*  PROCALCITON 3.07 12.97 25.60 -- 60.31 --   Cultures: 8/5  Blood x2>>negative 8/5 Urine >>negative 8/6 Urine >>negative 8/8 Respiratory>>oral flora  Antibiotics: Ertapenem 8/5>>8/5 8/5  Zosyn >>> 8/7  Vancomycin >>>8/9  A:  Peritonitis for perforated gastric ulcer. P:   Continue zosyn per GI>>Day 9/x   ENDOCRINE  Lab 06/18/12 0755 06/18/12 0353 06/18/12 0005 06/17/12 2006 06/17/12 1614  GLUCAP 124* 140* 118* 142* 169*   A:  DM type II with hyperglycemia.  History of hyperthyroidism, not  on treatment. P:   SSI May need input from renal or endocrine if calcium remains persistently elevated  NEUROLOGIC  A:  Acute encephalopathy (hypoxia, sedation).  Postop pain. P:   PRN fentanyl   BEST PRACTICE / DISPOSITION Level of Care:  ICU Primary Service:  CCS Consultants:  PCCM Code Status:  Full Diet:  NPO / TNA DVT Px:  Heparin GI Px:  Protonix Skin Integrity:  Intact Social / Family:  No family at bedside   Reviewed above, examined pt, and agree with assessment/plan.  Coralyn Helling,  MD [facility] Pulmonary/Critical Care 06/18/2012, 9:46 AM Pager:  [phone] After 3pm call: [phone]

## 2012-06-18 NOTE — Plan of Care (Signed)
Problem: Phase II Progression Outcomes Goal: Date pt extubated/weaned off vent Outcome: Completed/Met Date Met:  06/18/12 8/12

## 2012-06-18 NOTE — Progress Notes (Signed)
ANTIBIOTIC CONSULT NOTE - FOLLOW UP  Pharmacy Consult for Zosyn Indication: peritonitis/perforated gastric ulcer  No Known Allergies  Patient Measurements: Height: 5\' 2"  (157.5 cm) Weight: 128 lb 1.4 oz (58.1 kg) IBW/kg (Calculated) : 50.1   Vital Signs: Temp: 99.1 F (37.3 C) (08/13 0500) BP: 137/54 mmHg (08/13 0500) Pulse Rate: 78  (08/13 0500)  Labs:  Basename 06/18/12 0545 06/17/12 0510 06/16/12 0513  WBC -- 8.7 --  HGB -- 9.2* --  PLT -- 234 --  LABCREA -- -- --  CREATININE 1.68* 1.82* 1.86*   Estimated Creatinine Clearance: 23.2 ml/min (by C-G formula based on Cr of 1.68).  Microbiology: 8/5 blood x 2 >> NGF 8/5 urine >> NGF 8/6 Urine >> NGF 8/6 mrsa pcr >> negative 8/8 Sputum: non pathogenic orphar-type flora  Assessment: April [name] admitted 8/5 via ER, c/o abdominal pain x 2 weeks, s/p emergent exploratory laparotomy 8/5 which showed perforated prepyloric gastric ulcer. Zosyn empirically started 8/6 for peritonitis.  Vancomycin added for broadened coverage of possible aspiration pneumonia 8/7-8/10.  Today is day #8 Zosyn.  Abx duration per GI.  Renal function at baseline, no adjustments to Zosyn dose.  Goal of Therapy:  Doses adjusted per renal clearance  Plan:  Continue Zosyn 3.375g IV q8h (4 hour infusion time). F/u LOT for abx.  Clance Boll 06/18/2012,8:17 AM

## 2012-06-18 NOTE — Progress Notes (Signed)
Nutrition Follow-up  Intervention:  1. TNA per pharmacy, recommend meet > 90% of estimated energy needs.  2. RD to follow for nutrition plan of care.   Assessment:   Patient currently receiving Clinimix E 5/15 @ 50 ml/hr plus lipids 20% 10 ml/hr MWF, weekly average meets 84.5% of estimated kcal needs and 87% of protein needs. Per pharmacy TNA to increase to 65 ml/hr plus lipids lipids 20% 10 ml/hr MWF, to provide 1313 kcal and 78 grams of protein. (To meet 100% of estimated calorie and protein needs). Discussed patient in rounds, pt extubated yesterday. Blood sugars have been less than 180. Per patient's family, pt has never been a big eater.   Diet Order:  TNA Clinimix E 5/15 @ 50 ml/hr plus lipids 20% 10 ml/hr MWF.   Meds: Scheduled Meds:   . ipratropium  0.5 mg Nebulization Q6H   And  . albuterol  2.5 mg Nebulization Q6H  . antiseptic oral rinse  15 mL Mouth Rinse QID  . chlorhexidine  15 mL Mouth Rinse BID  . heparin  5,000 Units Subcutaneous Q8H  . insulin aspart  0-15 Units Subcutaneous Q4H  . pantoprazole (PROTONIX) IV  40 mg Intravenous Q12H  . piperacillin-tazobactam (ZOSYN)  IV  3.375 g Intravenous Q8H  . sodium chloride  10-40 mL Intracatheter Q12H   Continuous Infusions:   . sodium chloride 50 mL/hr (06/17/12 0953)  . fat emulsion 240 mL (06/17/12 1737)  . TPN (CLINIMIX) +/- additives 50 mL/hr at 06/16/12 1702  . TPN (CLINIMIX) +/- additives 50 mL/hr at 06/17/12 1737  . TPN (CLINIMIX) +/- additives     PRN Meds:.fentaNYL, ondansetron, sodium chloride  Labs:  CMP     Component Value Date/Time   NA 141 06/18/2012 0545   K 3.8 06/18/2012 0545   CL 111 06/18/2012 0545   CO2 23 06/18/2012 0545   GLUCOSE 131* 06/18/2012 0545   BUN 36* 06/18/2012 0545   CREATININE 1.68* 06/18/2012 0545   CALCIUM 11.7* 06/18/2012 0545   PROT 5.2* 06/17/2012 0510   ALBUMIN 1.7* 06/17/2012 0510   AST 20 06/17/2012 0510   ALT 17 06/17/2012 0510   ALKPHOS 63 06/17/2012 0510   BILITOT 0.3 06/17/2012  0510   GFRNONAA 29* 06/18/2012 0545   GFRAA 33* 06/18/2012 0545     Intake/Output Summary (Last 24 hours) at 06/18/12 1147 Last data filed at 06/18/12 1000  Gross per 24 hour  Intake   2585 ml  Output   2515 ml  Net     70 ml   *Fluctuations in weight likely partially due to changes in fluid status.   Weight Status:  128 lb.Marland Kitchen  Re-estimated needs:  1250-1550 kcal, 69-81.5 grams of protein  Nutrition Dx:  Inadequate Oral Intake, Ongoing, Continue.   Goal:  1. Meet > 90% of estimated energy needs with nutrition support. - Not yet meeting continue.  2. Diet advancements as medically able.   Monitor:  TNA per pharmacy, diet advancements/ tolerance, weights, labs, I/O's   Adron Bene [phone]

## 2012-06-18 NOTE — Evaluation (Signed)
Physical Therapy Evaluation Patient Details Name: April [name] MRN: [id number] DOB: 01-03-1935 Today's Date: 06/18/2012 Time: 1440-1500 PT Time Calculation (min): 20 min  PT Assessment / Plan / Recommendation Clinical Impression  77 yo female admitted for exploratory lap to repair perforated ulcer.  Prior to admission she had problem with recurrent falls due to ? She exhibits bilateral ankle clonus and deconditioning due to prolonged hospital stay and medical acuity continuing to require multiple lines and tubes.  Anticipate reconvery may be complicated and prolonged by these factors and pt will need continued PT at SNF prior to d/c to home where she will be alone much of the time.    PT Assessment  Patient needs continued PT services    Follow Up Recommendations  Skilled nursing facility    Barriers to Discharge Decreased caregiver support      Equipment Recommendations  Defer to next venue    Recommendations for Other Services     Frequency Min 3X/week    Precautions / Restrictions Precautions Precautions: Fall Precaution Comments: daughter reports pt has been falling 4-5 x a week for about a year Restrictions Weight Bearing Restrictions: No   Pertinent Vitals/Pain Pt with increased resp rate with activity, venturi mask , ng tube, iv, telemtry, foly, O2 sat monitor      Mobility  Bed Mobility Bed Mobility: Supine to Sit Supine to Sit: 1: +2 Total assist;HOB elevated Supine to Sit: Patient Percentage: 50% Details for Bed Mobility Assistance: pt able to move legs to EOB to initiate transfer Transfers Transfers: Sit to Stand;Stand to Sit Sit to Stand: 1: +2 Total assist Sit to Stand: Patient Percentage: 50% Stand to Sit: 1: +2 Total assist Stand to Sit: Patient Percentage: 50% Details for Transfer Assistance: pt needed assist to come up into standing, but once up, she was able to stand on legs and assist with pivot to chair  Pt with multiple lines and  tubes Ambulation/Gait Stairs: No Wheelchair Mobility Wheelchair Mobility: No    Exercises     PT Diagnosis: Difficulty walking;Abnormality of gait;Generalized weakness  PT Problem List: Decreased strength;Decreased activity tolerance;Decreased balance;Decreased mobility;Decreased knowledge of use of DME PT Treatment Interventions: DME instruction;Gait training;Functional mobility training;Therapeutic activities;Therapeutic exercise;Balance training;Patient/family education   PT Goals Acute Rehab PT Goals PT Goal Formulation: With patient/family Time For Goal Achievement: 07/02/12 Potential to Achieve Goals: Fair Pt will go Supine/Side to Sit: with min assist PT Goal: Supine/Side to Sit - Progress: Goal set today Pt will go Sit to Stand: with min assist PT Goal: Sit to Stand - Progress: Goal set today Pt will Ambulate: 51 - 150 feet;with min assist;with least restrictive assistive device PT Goal: Ambulate - Progress: Goal set today Pt will Perform Home Exercise Program: with min assist PT Goal: Perform Home Exercise Program - Progress: Goal set today  Visit Information  Last PT Received On: 06/18/12    Subjective Data  Subjective: Daughter reports pt has been having multiple falls for about a year. MD was unable to determine cause Patient Stated Goal: to get out of bed   Prior Functioning  Home Living Lives With: Son Available Help at Discharge: Available PRN/intermittently;Other (Comment) (son works 14-16 hours a day, pt. alone then) Type of Home: Mobile home Home Access: Stairs to enter [employer] of Steps: 3 Entrance Stairs-Rails: Right;Left Home Layout: One level Prior Function Comments: falls Communication Communication: No difficulties;Other (comment) (pt with ng tube and ventri mask impeding communication)    Cognition  Overall Cognitive  Status: Appears within functional limits for tasks assessed/performed Arousal/Alertness: Awake/alert Behavior  During Session: [facility] for tasks performed    Extremity/Trunk Assessment Right Lower Extremity Assessment RLE ROM/Strength/Tone: Deficits RLE ROM/Strength/Tone Deficits: multibeat prolonged ankle clonus.  otherwise WFL ROM and strength Left Lower Extremity Assessment LLE ROM/Strength/Tone: Deficits LLE ROM/Strength/Tone Deficits: + 4-5 beat ankle clonus otherwise ROM and strength appear WFL Trunk Assessment Trunk Assessment: Other exceptions Trunk Exceptions: dressings over abdominal incisions   Balance    End of Session PT - End of Session Equipment Utilized During Treatment: Oxygen Activity Tolerance: Patient limited by fatigue Patient left: in chair Nurse Communication: Need for lift equipment  GP     Rosey Bath K. Manson Passey, Pt [phone] 06/18/2012, 3:14 PM

## 2012-06-19 ENCOUNTER — Inpatient Hospital Stay (HOSPITAL_COMMUNITY): Payer: PRIVATE HEALTH INSURANCE

## 2012-06-19 LAB — BASIC METABOLIC PANEL
CO2: 22 mEq/L (ref 19–32)
Calcium: 11.5 mg/dL — ABNORMAL HIGH (ref 8.4–10.5)
GFR calc Af Amer: 37 mL/min — ABNORMAL LOW (ref >90)
GFR calc non Af Amer: 32 mL/min — ABNORMAL LOW (ref >90)
Sodium: 140 mEq/L (ref 135–145)

## 2012-06-19 LAB — GLUCOSE, CAPILLARY: Glucose-Capillary: 131 mg/dL — ABNORMAL HIGH (ref 70–99)

## 2012-06-19 MED ORDER — FUROSEMIDE 10 MG/ML IJ SOLN
20.0000 mg | Freq: Once | INTRAMUSCULAR | Status: AC
  Administered 2012-06-19: 20 mg via INTRAVENOUS
  Filled 2012-06-19: qty 2

## 2012-06-19 MED ORDER — SODIUM CHLORIDE 0.9 % IJ SOLN
INTRAMUSCULAR | Status: AC
  Filled 2012-06-19: qty 3

## 2012-06-19 MED ORDER — ZINC TRACE METAL 1 MG/ML IV SOLN
INTRAVENOUS | Status: AC
  Administered 2012-06-19: 18:00:00 via INTRAVENOUS
  Filled 2012-06-19: qty 2000

## 2012-06-19 MED ORDER — FAT EMULSION 20 % IV EMUL
240.0000 mL | INTRAVENOUS | Status: AC
  Administered 2012-06-19: 240 mL via INTRAVENOUS
  Filled 2012-06-19: qty 250

## 2012-06-19 NOTE — Progress Notes (Signed)
Much more alert this am. Denies abd pain, abd still a bit distended, bile in NG

## 2012-06-19 NOTE — Progress Notes (Signed)
PARENTERAL NUTRITION CONSULT NOTE - Follow up  Pharmacy Consult for TNA Indication: s/p gastric surgery, PCM.  No Known Allergies  Patient Measurements: Height: 5\' 2"  (157.5 cm) Weight: 132 lb 4.4 oz (60 kg) IBW/kg (Calculated) : 50.1   Vital Signs: Temp: 99.9 F (37.7 C) (08/14 0700) Temp src: Core (Comment) (08/14 0600) BP: 106/40 mmHg (08/14 0700) Pulse Rate: 71  (08/14 0700) Intake/Output from previous day: 08/13 0701 - 08/14 0700 In: 2813.8 [I.V.:1200; IV Piggyback:110; TPN:1503.8] Out: 2245 [Urine:1860; Emesis/NG output:350; Drains:35] Intake/Output from this shift: Total I/O In: -  Out: 125 [Urine:125]  Labs:  [facility] 06/17/12 0510  WBC 8.7  HGB 9.2*  HCT 28.2*  PLT 234  APTT --  INR --    Basename 06/19/12 0430 06/18/12 0545 06/17/12 0510  NA 140 141 140  K 3.5 3.8 4.5  CL 110 111 110  CO2 22 23 24   GLUCOSE 117* 131* 213*  BUN 35* 36* 38*  CREATININE 1.56* 1.68* 1.82*  LABCREA -- -- --  CREAT24HRUR -- -- --  CALCIUM 11.5* 11.7* 11.8*  MG -- -- 1.7  PHOS -- -- 3.7  PROT -- -- 5.2*  ALBUMIN -- -- 1.7*  AST -- -- 20  ALT -- -- 17  ALKPHOS -- -- 63  BILITOT -- -- 0.3  BILIDIR -- -- --  IBILI -- -- --  PREALBUMIN -- -- 14.2*  TRIG -- -- 98  CHOLHDL -- -- --  CHOL -- -- 88   Estimated Creatinine Clearance: 25 ml/min (by C-G formula based on Cr of 1.56).    Basename 06/19/12 1124 06/19/12 0752 06/19/12 0334  GLUCAP 131* 120* 116*    Medical History: Past Medical History  Diagnosis Date  . Diabetes mellitus   . Hyperthyroidism   . Hypertension     Medications:  Scheduled:     . ipratropium  0.5 mg Nebulization Q6H   And  . albuterol  2.5 mg Nebulization Q6H  . antiseptic oral rinse  15 mL Mouth Rinse QID  . chlorhexidine  15 mL Mouth Rinse BID  . furosemide  20 mg Intravenous Once  . heparin  5,000 Units Subcutaneous Q8H  . insulin aspart  0-15 Units Subcutaneous Q4H  . pantoprazole (PROTONIX) IV  40 mg Intravenous Q12H  .  piperacillin-tazobactam (ZOSYN)  IV  3.375 g Intravenous Q8H  . sodium chloride  10-40 mL Intracatheter Q12H  . sodium chloride       Nutritional Goals:  per RD: 1250-1550Kcal/d and protein 69-82g/d.  Current nutrition:  NPO Climimix 5/15 @65ml /hr MIVF: NS @ KVO  CBGs & Insulin requirements past 24 hours: CBG 116-133 (50 units insulin per 2L TNA bag) Moderate SSI q4h (8 units used)  Labs:   Electrolytes - Corr Ca 13.3, no longer increasing since switched to electrolyte-free TNA. Other lytes WNL.  SCr remains elevated, slowly trending down (chronic renal insufficiency, baseline SCr unknown)  LFTs - wnl  TGs - wnl  Prealbumin improving - 14.2 (8/12), 10.3 (8/8)  CBGs improved and now controlled (<150 mg/dL) with insulin in TNA.   Assessment: [name] s/p repair of perforated prepyloric ulcer on 06/10/12. TNA started 8/7 d/t need for bowel rest.  Remains on TNA for continued post-op ileus.  NG output appears to have declined, 350 mL recorded yesterday.  GI note suggests discontinuing NG soon (once abd wakes up) but will continue TNA for now.  Plan:  Continue electrolyte free formula (Clinimix 5/15) due to elevated Ca. Continue Clinimix  5/15 at 65 ml/hr.  Clinimix 5/15 at goal rate 65 ml/hr + lipids at 38ml/hr on MWF will provide:  78g/day protein and 1587 Kcal/day MWF, 1107 Kcal/day STTHS (Avg. 1312 Kcal/day weekly).  Continue with insulin in TNA at 50 units per 2L bag.  Estimated that patient will receive ~39 units over 24 hours at TNA rate of 65 ml/hr.    Fat emulsion at 44ml/hr (MWF only due to ongoing shortage).   MIVF per MD.  TNA to contain standard multivitamins and trace elements(MWF only due to ongoing shortage).  TNA lab panels on Mondays & Thursdays.  F/u advancement of diet and plan to wean TNA off.  Clance Boll, PharmD, BCPS Pager: [phone] 06/19/2012 11:31 AM

## 2012-06-19 NOTE — Progress Notes (Signed)
Name: April [name] MRN: [id number] DOB: [birth year]    LOS: 9  Referring Provider:  CCS Reason for Referral:  Acute respiratory failure  PULMONARY / CRITICAL CARE MEDICINE   Brief patient description:  77 yo female smoker admitted 06/10/2012 abdominal pain and pneumoperitoneum from perforated pre-pyloric gastric ulcer.  Had emergent laparotomy with repair of gastric ulcer and omental patch on 8/05.  She developed acute hypoxic respiratory failure 8/07 and PCCM consulted. PMHx of CKD, DM, Hyperparathyroidism, HTN  Events Since Admission: 8/5  Admitted with gastric ulcer perforation, exploratory lap with ulcer repair 8/7  Developed respiratory distress, intubated > ext 8/9 am 8/9 pm resp distress, re-et  Current Status: Feels better.  Still short of breath.  Denies chest pain.  Vital Signs: Temp:  [98.3 F (36.8 C)-100.9 F (38.3 C)] 99.9 F (37.7 C) (08/14 0700) Pulse Rate:  [71-86] 71  (08/14 0700) Resp:  [23-32] 28  (08/14 0700) BP: (101-146)/(33-61) 106/40 mmHg (08/14 0700) SpO2:  [93 %-97 %] 95 % (08/14 0837) FiO2 (%):  [40 %] 40 % (08/14 0837) Weight:  [132 lb 4.4 oz (60 kg)] 132 lb 4.4 oz (60 kg) (08/14 0000)  Intake/Output Summary (Last 24 hours) at 06/19/12 0848 Last data filed at 06/19/12 0800  Gross per 24 hour  Intake 2703.75 ml  Output   2310 ml  Net 393.75 ml  50 % venturi mask  CVP:  [3 mmHg-10 mmHg] 3 mmHg     Physical Examination: General:  No distress Neuro:  More alert, follows commands HEENT:  NG tube in place Neck:  supple Cardiovascular:  S1, S2 regular Lungs:  Scattered rhonchi, basilar rales Abdomen:  Wound dressing clean, JP intact  Musculoskeletal:  No edema Skin:  Intact  Dg Chest Port 1 View  06/19/2012  *RADIOLOGY REPORT*  Clinical Data: Follow-up evaluation of atelectasis.  PORTABLE CHEST - 1 VIEW  Comparison: Chest x-ray 06/17/2012.  Findings: A nasogastric tube is seen extending into the stomach, however, the tip of the nasogastric  tube extends below the lower margin of the image. There is a right upper extremity PICC with tip terminating in the proximal superior vena cava. Lung volumes are low.  There continue to be bibasilar opacities which may represent areas of atelectasis and/or consolidation with superimposed small bilateral pleural effusions.  Overall, aeration is slightly worsened throughout the lung bases bilaterally.  Mild pulmonary venous congestion without frank pulmonary edema.  Heart size is within normal limits. The patient is rotated to the left on today's exam, resulting in distortion of the mediastinal contours and reduced diagnostic sensitivity and specificity for mediastinal pathology.  Atherosclerosis in the thoracic aorta.  Surgical clips project over the right upper quadrant of the abdomen, consistent with prior cholecystectomy.  IMPRESSION: 1.  Slight worsening aeration throughout the lung bases bilaterally.  These findings presumably reflect a combination of atelectasis and/or consolidation with superimposed small bilateral pleural effusions. 2.  Support apparatus, as above. 3.  Atherosclerosis.  Original Report Authenticated By: Florencia Reasons, M.D.   Dg Chest Port 1 View  06/17/2012  *RADIOLOGY REPORT*  Clinical Data: Respiratory distress  PORTABLE CHEST - 1 VIEW  Comparison: Yesterday  Findings: Endotracheal tube removed.  NG tube and right PICC stable.  Low volumes with increased hazy bibasilar airspace disease versus volume loss. No pneumothorax. Pleural effusions are not excluded.  IMPRESSION: Extubated.  Increased bibasilar airspace disease versus volume loss.  Original Report Authenticated By: Donavan Burnet, M.D.   Varney Biles Kayleen Memos W/water  Sol Cm  06/18/2012  *RADIOLOGY REPORT*  Clinical Data:  Postop day #8 surgical repair of a perforated duodenal ulcer.  UPPER GI SERIES WITH KUB  Technique:  Routine upper GI series was performed with 85 ml Omnipaque-300 injected through an NG tube  Fluoroscopy Time: 1.2  minutes  Comparison:  CT 06/10/2012  Findings: NG tube is present in the stomach extending into the duodenum.  Normal bowel gas pattern.  Contrast was injected through the NG tube.  The stomach is collapsed and normal-appearing.  Contrast readily enters the duodenum and proximal jejunum.  No bowel obstruction.  No contrast leak into the peritoneal cavity.  IMPRESSION: Negative upper GI.  No evidence of leak or obstruction.  Original Report Authenticated By: Camelia Phenes, M.D.    ASSESSMENT AND PLAN  PULMONARY  Lab 06/16/12 0352 06/12/12 2204  PHART 7.384 7.390  PCO2ART 40.8 30.1*  PO2ART 80.3 89.0  HCO3 23.6 17.8*  O2SAT 94.9 96.9    Ventilator Settings: Vent Mode:  [-]  FiO2 (%):  [40 %] 40 %  ETT:  8/7 > 8/9 ETT 8/9 >>>8/12  8/05 CT chest>>diffuse atherosclerosis, diffuse centrilobular emphysema, basilar ATX, spinal DJD with mild scoliosis, 7 mm LUL nodule  A:  Acute hypoxemic respiratory failure>>likely from pulmonary edema in setting of probable COPD, and decreased abd compliance after laparotomy. Pulmonary nodule. P:   Continue scheduled bronchodilators Bronchial hygiene BPAP prn Mobilize as tolerated Give lasix 20 mg IV x one 8/14 F/u CXR intermittently Will need outpt f/u for Lt upper lobe nodule  CARDIOVASCULAR  Lab 06/13/12 1115 06/13/12 0857 06/13/12 0856 06/13/12 0450 06/12/12 2138  TROPONINI -- -- <0.30 <0.30 <0.30  LATICACIDVEN -- 0.8 -- 0.7 0.7  PROBNP 463.8* -- -- -- --   Lines: R PICC 8/6 >>>  2 d echo 8/8: EF 55 to 60%, grade 1 diastolic dysfx  A: Shock>>resolved.   Likely acute on chronic diastolic dysfx with acute pulmonary edema>>improved.   Hx of HTN.   Diffuse atherosclerosis on CT chest. P:  Will likely need cardiology evaluation at some point for CAD  RENAL  Lab 06/19/12 0430 06/18/12 0545 06/17/12 0510 06/16/12 0513 06/15/12 0514 06/14/12 0500 06/13/12 0450  NA 140 141 140 139 140 -- --  K 3.5 3.8 -- -- -- -- --  CL 110 111 110 108  107 -- --  CO2 22 23 24 23 26  -- --  BUN 35* 36* 38* 35* 30* -- --  CREATININE 1.56* 1.68* 1.82* 1.86* 1.93* -- --  CALCIUM 11.5* 11.7* 11.8* 10.8* 10.4 -- --  MG -- -- 1.7 -- -- 1.7 1.8  PHOS -- -- 3.7 -- -- 2.9 2.8   Intake/Output      08/13 0701 - 08/14 0700 08/14 0701 - 08/15 0700   I.V. (mL/kg) 1200 (20)    IV Piggyback 110    TPN 1503.8    Total Intake(mL/kg) 2813.8 (46.9)    Urine (mL/kg/hr) 1860 (1.3) 125   Emesis/NG output 350    Drains 35    Total Output 2245 125   Net +568.8 -125         Foley:  8/5 >>>  A: Acute renal failure with hx of CKD.  Baseline creatinine 1.85 (followed by Dr. Lowell Guitar as outpt). P: F/u renal fx, electrolytes will getting lasix Monitor urine outpt Goal negative fluid balance KVO IV fluids  GASTROINTESTINAL  Lab 06/17/12 0510 06/15/12 0514 06/14/12 0500 06/13/12 0900 06/13/12 0450  AST 20 20 21  19 19  ALT 17 20 19 21 24   ALKPHOS 63 67 106 54 57  BILITOT 0.3 0.3 0.4 0.3 0.3  PROT 5.2* 5.3* 4.7* 4.6* 4.7*  ALBUMIN 1.7* 1.8* 1.6* 1.7* 1.7*   A:  Perforated gastric ulcer s/p repair. P:   Post-op care per CCS TNA for nutrition until able to use gut>>defer timing to surgery Protonix q12h  HEMATOLOGIC  Lab 06/17/12 0510 06/15/12 0514 06/14/12 0500 06/13/12 0450  HGB 9.2* 10.0* 9.5* 10.8*  HCT 28.2* 30.4* 27.8* 31.4*  PLT 234 167 153 169  INR -- -- -- --  APTT -- -- -- --   A:  Anemia of critical illness. P:  F/u CBC Transfuse for Hb < 7  INFECTIOUS  Lab 06/17/12 0510 06/15/12 0514 06/14/12 0500 06/13/12 0450 06/12/12 2137  WBC 8.7 6.6 9.7 13.9* --  PROCALCITON 3.07 12.97 25.60 -- 60.31   Cultures: 8/5  Blood x2>>negative 8/5 Urine >>negative 8/6 Urine >>negative 8/8 Respiratory>>oral flora  Antibiotics: Ertapenem 8/5>>8/5 8/5  Zosyn >>> 8/7  Vancomycin >>>8/9  A:  Peritonitis for perforated gastric ulcer. P:   Continue zosyn per GI>>Day 10/x can likely d/c soon if okay with surgery   ENDOCRINE  Lab 06/19/12  0752 06/19/12 0334 06/18/12 2348 06/18/12 2000 06/18/12 1654  GLUCAP 120* 116* 133* 142* 117*   A:  DM type II with hyperglycemia.   History of hyperthyroidism, not on treatment. Hypercalcemia. P:   SSI May need input from renal or endocrine if calcium remains persistently elevated  NEUROLOGIC  A:  Acute encephalopathy (hypoxia, sedation)>>resolved.   Postop pain. P:   PRN fentanyl   BEST PRACTICE / DISPOSITION Level of Care:  ICU Primary Service:  CCS Consultants:  PCCM Code Status:  Full Diet:  NPO / TNA DVT Px:  Heparin GI Px:  Protonix Skin Integrity:  Intact Social / Family:  No family at bedside   Coralyn Helling, MD [facility] Pulmonary/Critical Care 06/19/2012, 8:48 AM Pager:  [phone] After 3pm call: [phone]

## 2012-06-19 NOTE — Progress Notes (Signed)
Patient ID: April [name], female   DOB: 1935-05-31, 77 y.o.   MRN: [id number] 9 Days Post-Op  Subjective: Pt is awake and feels well.  Not much abdominal pain.  No flatus yet.  Objective: Vital signs in last 24 hours: Temp:  [98.3 F (36.8 C)-100.9 F (38.3 C)] 99.9 F (37.7 C) (08/14 0400) Pulse Rate:  [74-86] 80  (08/14 0400) Resp:  [23-32] 30  (08/14 0400) BP: (101-146)/(33-61) 103/38 mmHg (08/14 0400) SpO2:  [93 %-97 %] 93 % (08/14 0400) FiO2 (%):  [40 %-50 %] 40 % (08/14 0400) Weight:  [132 lb 4.4 oz (60 kg)] 132 lb 4.4 oz (60 kg) (08/14 0000) Last BM Date: 06/15/12  Intake/Output from previous day: 08/13 0701 - 08/14 0700 In: 2571.3 [I.V.:1100; IV Piggyback:97.5; TPN:1373.8] Out: 1850 [Urine:1730; Emesis/NG output:100; Drains:20] Intake/Output this shift:    PE: Abd: soft, tender, wound is clean.  NGt with some bilious output.  JP with serous output only.  Lab Results:   [facility] 06/17/12 0510  WBC 8.7  HGB 9.2*  HCT 28.2*  PLT 234   BMET  Basename 06/19/12 0430 06/18/12 0545  NA 140 141  K 3.5 3.8  CL 110 111  CO2 22 23  GLUCOSE 117* 131*  BUN 35* 36*  CREATININE 1.56* 1.68*  CALCIUM 11.5* 11.7*   PT/INR No results found for this basename: LABPROT:2,INR:2 in the last 72 hours CMP     Component Value Date/Time   NA 140 06/19/2012 0430   K 3.5 06/19/2012 0430   CL 110 06/19/2012 0430   CO2 22 06/19/2012 0430   GLUCOSE 117* 06/19/2012 0430   BUN 35* 06/19/2012 0430   CREATININE 1.56* 06/19/2012 0430   CALCIUM 11.5* 06/19/2012 0430   PROT 5.2* 06/17/2012 0510   ALBUMIN 1.7* 06/17/2012 0510   AST 20 06/17/2012 0510   ALT 17 06/17/2012 0510   ALKPHOS 63 06/17/2012 0510   BILITOT 0.3 06/17/2012 0510   GFRNONAA 32* 06/19/2012 0430   GFRAA 37* 06/19/2012 0430   Lipase  No results found for this basename: lipase       Studies/Results: Dg Chest Port 1 View  06/19/2012  *RADIOLOGY REPORT*  Clinical Data: Follow-up evaluation of atelectasis.  PORTABLE CHEST -  1 VIEW  Comparison: Chest x-ray 06/17/2012.  Findings: A nasogastric tube is seen extending into the stomach, however, the tip of the nasogastric tube extends below the lower margin of the image. There is a right upper extremity PICC with tip terminating in the proximal superior vena cava. Lung volumes are low.  There continue to be bibasilar opacities which may represent areas of atelectasis and/or consolidation with superimposed small bilateral pleural effusions.  Overall, aeration is slightly worsened throughout the lung bases bilaterally.  Mild pulmonary venous congestion without frank pulmonary edema.  Heart size is within normal limits. The patient is rotated to the left on today's exam, resulting in distortion of the mediastinal contours and reduced diagnostic sensitivity and specificity for mediastinal pathology.  Atherosclerosis in the thoracic aorta.  Surgical clips project over the right upper quadrant of the abdomen, consistent with prior cholecystectomy.  IMPRESSION: 1.  Slight worsening aeration throughout the lung bases bilaterally.  These findings presumably reflect a combination of atelectasis and/or consolidation with superimposed small bilateral pleural effusions. 2.  Support apparatus, as above. 3.  Atherosclerosis.  Original Report Authenticated By: Florencia Reasons, M.D.   Dg Chest Port 1 View  06/17/2012  *RADIOLOGY REPORT*  Clinical Data: Respiratory distress  PORTABLE CHEST - 1 VIEW  Comparison: Yesterday  Findings: Endotracheal tube removed.  NG tube and right PICC stable.  Low volumes with increased hazy bibasilar airspace disease versus volume loss. No pneumothorax. Pleural effusions are not excluded.  IMPRESSION: Extubated.  Increased bibasilar airspace disease versus volume loss.  Original Report Authenticated By: Donavan Burnet, M.D.   Dg Kayleen Memos W/water Sol Cm  06/18/2012  *RADIOLOGY REPORT*  Clinical Data:  Postop day #8 surgical repair of a perforated duodenal ulcer.  UPPER GI  SERIES WITH KUB  Technique:  Routine upper GI series was performed with 85 ml Omnipaque-300 injected through an NG tube  Fluoroscopy Time: 1.2 minutes  Comparison:  CT 06/10/2012  Findings: NG tube is present in the stomach extending into the duodenum.  Normal bowel gas pattern.  Contrast was injected through the NG tube.  The stomach is collapsed and normal-appearing.  Contrast readily enters the duodenum and proximal jejunum.  No bowel obstruction.  No contrast leak into the peritoneal cavity.  IMPRESSION: Negative upper GI.  No evidence of leak or obstruction.  Original Report Authenticated By: Camelia Phenes, M.D.    Anti-infectives: Anti-infectives     Start     Dose/Rate Route Frequency Ordered Stop   06/13/12 2200   vancomycin (VANCOCIN) 500 mg in sodium chloride 0.9 % 100 mL IVPB  Status:  Discontinued        500 mg 100 mL/hr over 60 Minutes Intravenous Every 24 hours 06/12/12 2218 06/15/12 0840   06/12/12 2230   vancomycin (VANCOCIN) 750 mg in sodium chloride 0.9 % 150 mL IVPB        750 mg 150 mL/hr over 60 Minutes Intravenous  Once 06/12/12 2218 06/12/12 2330   06/11/12 1800  piperacillin-tazobactam (ZOSYN) IVPB 3.375 g       3.375 g 12.5 mL/hr over 240 Minutes Intravenous Every 8 hours 06/11/12 1343     06/11/12 0200   piperacillin-tazobactam (ZOSYN) IVPB 3.375 g  Status:  Discontinued        3.375 g 12.5 mL/hr over 240 Minutes Intravenous Every 8 hours 06/11/12 0133 06/11/12 1341   06/10/12 1945   ertapenem (INVANZ) 1 g in sodium chloride 0.9 % 50 mL IVPB        1 g 100 mL/hr over 30 Minutes Intravenous  Once 06/10/12 1930 06/10/12 2023           Assessment/Plan  1. S/p ex lap with gastric ulcer repair 2. VDRF x2, now extubated 3. PCM/TNA  Plan: 1. Will go ahead and get a UGI with gastrograffin today through her NGT.  Once abd wakes up then we can dc her NGT and do a swallow study prior to giving her a diet. 2. Medical problems per CCM, appreciate their assistance  with this patient.  3. Cont TNA for now.  LOS: 9 days    Nayef College E 06/19/2012

## 2012-06-19 NOTE — Progress Notes (Signed)
CARE MANAGE MENT UTILIZATION REVIEW NOTE 06/19/2012     Patient:  April [name], April [name]   Account Number:  [id number]  Documented by:  ZOXWRU DAVIS   Per Ur Regulation Reviewed for med. necessity/level of care/duration of stay

## 2012-06-19 NOTE — Progress Notes (Signed)
OT Note:  Spoke to Charity fundraiser.  Held OT today due to respiratory status.  Will check back tomorrow.  [place], OTR/L [phone] 06/19/2012

## 2012-06-20 LAB — COMPREHENSIVE METABOLIC PANEL
ALT: 26 U/L (ref 0–35)
Alkaline Phosphatase: 91 U/L (ref 39–117)
CO2: 23 mEq/L (ref 19–32)
Chloride: 109 mEq/L (ref 96–112)
GFR calc Af Amer: 38 mL/min — ABNORMAL LOW (ref >90)
Glucose, Bld: 150 mg/dL — ABNORMAL HIGH (ref 70–99)
Potassium: 2.8 mEq/L — ABNORMAL LOW (ref 3.5–5.1)
Sodium: 140 mEq/L (ref 135–145)
Total Protein: 5.5 g/dL — ABNORMAL LOW (ref 6.0–8.3)

## 2012-06-20 LAB — CBC
MCH: 30.3 pg (ref 26.0–34.0)
MCV: 90.9 fL (ref 78.0–100.0)
Platelets: 387 10*3/uL (ref 150–400)
RBC: 3.2 MIL/uL — ABNORMAL LOW (ref 3.87–5.11)

## 2012-06-20 LAB — GLUCOSE, CAPILLARY
Glucose-Capillary: 112 mg/dL — ABNORMAL HIGH (ref 70–99)
Glucose-Capillary: 132 mg/dL — ABNORMAL HIGH (ref 70–99)
Glucose-Capillary: 96 mg/dL (ref 70–99)

## 2012-06-20 MED ORDER — POTASSIUM PHOSPHATE DIBASIC 3 MMOLE/ML IV SOLN
30.0000 mmol | Freq: Once | INTRAVENOUS | Status: AC
  Administered 2012-06-20: 30 mmol via INTRAVENOUS
  Filled 2012-06-20: qty 10

## 2012-06-20 MED ORDER — MAGNESIUM SULFATE IN D5W 10-5 MG/ML-% IV SOLN
1.0000 g | Freq: Once | INTRAVENOUS | Status: AC
  Administered 2012-06-20: 1 g via INTRAVENOUS
  Filled 2012-06-20: qty 100

## 2012-06-20 MED ORDER — INSULIN REGULAR HUMAN 100 UNIT/ML IJ SOLN
INTRAVENOUS | Status: AC
  Administered 2012-06-20: 18:00:00 via INTRAVENOUS
  Filled 2012-06-20: qty 2000

## 2012-06-20 MED ORDER — POTASSIUM CHLORIDE 10 MEQ/100ML IV SOLN
10.0000 meq | INTRAVENOUS | Status: AC
Start: 2012-06-20 — End: 2012-06-20
  Administered 2012-06-20 (×3): 10 meq via INTRAVENOUS
  Filled 2012-06-20: qty 100
  Filled 2012-06-20: qty 200

## 2012-06-20 MED ORDER — POTASSIUM CHLORIDE CRYS ER 20 MEQ PO TBCR: 40.0000 meq | EXTENDED_RELEASE_TABLET | ORAL | Status: DC

## 2012-06-20 NOTE — Progress Notes (Signed)
Physical Therapy Treatment Patient Details Name: April [name] MRN: [id number] DOB: 13-Aug-1935 Today's Date: 06/20/2012 Time: 4098-1191 PT Time Calculation (min): 27 min  PT Assessment / Plan / Recommendation Comments on Treatment Session  Pt performed stand pivot (well took a couple steps) over to recliner with +2 assist.    Follow Up Recommendations  Skilled nursing facility    Barriers to Discharge        Equipment Recommendations  Defer to next venue    Recommendations for Other Services    Frequency     Plan Discharge plan remains appropriate;Frequency remains appropriate    Precautions / Restrictions Precautions Precautions: Fall Precaution Comments: Drain on L side, NG tube, multiple lines/leads   Pertinent Vitals/Pain No pain    Mobility  Bed Mobility Bed Mobility: Rolling Right;Right Sidelying to Sit Rolling Right: 3: Mod assist;With rail Right Sidelying to Sit: 1: +2 Total assist Right Sidelying to Sit: Patient Percentage: 30% Details for Bed Mobility Assistance: verbal cues for technique and safety, assist for both upper and lower body Transfers Transfers: Sit to Stand;Stand to Sit;Stand Pivot Transfers Sit to Stand: 1: +2 Total assist;From bed Sit to Stand: Patient Percentage: 40% Stand to Sit: To chair/3-in-1;1: +2 Total assist Stand to Sit: Patient Percentage: 30% Stand Pivot Transfers: 1: +2 Total assist Stand Pivot Transfers: Patient Percentage: 40% Details for Transfer Assistance: pt with posterior lean upon standing and did not weight shift so sat back down and attempted again with better ability to perform trunk extension so transferred over to recliner with increased verbal cues for technique    Exercises     PT Diagnosis:    PT Problem List:   PT Treatment Interventions:     PT Goals Acute Rehab PT Goals PT Goal: Supine/Side to Sit - Progress: Progressing toward goal PT Goal: Sit to Stand - Progress: Progressing toward goal  Visit  Information  Last PT Received On: 06/20/12 Assistance Needed: +2    Subjective Data  Subjective: Pt requiring cues to wait for therapists prior to getting OOB.   Cognition  Overall Cognitive Status: Appears within functional limits for tasks assessed/performed Cognition - Other Comments: Pt a little impulsive about getting OOB but followed verbal cues for waiting.    Balance     End of Session PT - End of Session Equipment Utilized During Treatment: Oxygen;Gait belt Activity Tolerance: Patient limited by fatigue Patient left: in chair;with call bell/phone within reach;with family/visitor present Nurse Communication: Mobility status;Other (comment) (+2 or lift)   GP     Jerusalen Mateja,KATHrine E 06/20/2012, 4:04 PM Pager: [phone]

## 2012-06-20 NOTE — Evaluation (Signed)
Clinical/Bedside Swallow Evaluation Patient Details  Name: MACKENZEE BECVAR MRN: [id number] Date of Birth: December 29, 1934  Today's Date: 06/20/2012 Time: 1110-1135 SLP Time Calculation (min): 25 min  Past Medical History:  Past Medical History  Diagnosis Date  . Diabetes mellitus   . Hyperthyroidism   . Hypertension    Past Surgical History:  Past Surgical History  Procedure Date  . 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 Laparotomy 06/10/2012    Procedure: EXPLORATORY LAPAROTOMY;  Surgeon: Adolph Pollack, MD;  Location: [facility] ORS;  Service: General;  Laterality: N/A;  closure of perforated gastric ulcer   HPI:  77 yo female adm to [facility] with perforated ulcer, underwent emergent surgery 06/10/12, developed respiratory failure 8/7-8/9, reintubated 8/9 pm to 06/17/12.  PMH + for COPD, asp pna, HTN, CRF, DM, hyperparathyroidism, acute resp fx with hypoxia, acute encephalopathy.     Assessment / Plan / Recommendation Clinical Impression  Limited po administered (tsp water, 1/2 tsp applesauce) due to pt's NG in place in this petite pt likely preventing adequate epiglottic deflection/airway protection.  Multiple swallows with each bolus with pt wincing to indicate pain and incr respiratory rate to 30.  Delayed wet cough noted- which could be secondary to pharyngeal stasis.  Pt has multiple risk factors and clinical indicators for dysphagia and asp pna.   Pt's COPD, current rapid respiratory rate, intubation 8/7-8/9 with reintubation 8/9 to 8/12, NG placement possibly causing edema, hoarse voice quality and poor awareness to wet vocal quality at baseline indicating secretions at vocal fold level are among many risk factors for pt.  Rec conservative tx plan. including to place Panda tube if able (if MD desires tube feeding) and keep pt npo with oral care- SLP to return next date to assess for improvement with swallow/voice.  Suspect pt's asp and asp pna risk will be chronic given pt's multiple comorbidiities  including COPD, extended hospital course and pt's severe deconditioning.      Aspiration Risk  Severe    Diet Recommendation NPO;Alternative means - temporary (consider Panda if able to allow better epiglottic deflection)        Other  Recommendations     Follow Up Recommendations       Frequency and Duration min 2x/week  2 weeks   Pertinent Vitals/Pain Afebrile, decreased    SLP Swallow Goals Goal #3: Pt will demonstrate ability to swallow without clinical indicators of aspiration without multiple swallows and clear voice throughout to aid in determining readiness for diet advancement.  Goal #4: Pt will cough and expectorate secretions when her vocal quality is wet with min verbal cues/assistance.   Swallow Study Prior Functional Status       General Date of Onset: 06/20/12 HPI: 77 yo female adm to [facility] with perforated ulcer, underwent emergent surgery 06/10/12, developed respiratory failure 8/7-8/9, reintubated 8/9 pm to 06/17/12.  PMH + for COPD, asp pna, HTN, CRF, DM, hyperparathyroidism, acute resp fx with hypoxia, acute encephalopathy.   Type of Study: Bedside swallow evaluation Previous Swallow Assessment: none Diet Prior to this Study: NPO (NG draining) Temperature Spikes Noted: No Respiratory Status: Supplemental O2 delivered via (comment) (3 liters) History of Recent Intubation: Yes Length of Intubations (days): 7 days Date extubated: 06/17/12 Behavior/Cognition: Alert;Cooperative;Hard of hearing;Requires cueing Oral Cavity - Dentition: Dentures, top;Dentures, bottom Self-Feeding Abilities: Total assist Patient Positioning: Upright in bed Baseline Vocal Quality: Hoarse;Wet Volitional Cough: Strong Volitional Swallow: Able to elicit (with wincing due to pain from NG)  Oral/Motor/Sensory Function Overall Oral Motor/Sensory Function: Impaired (generalized weakness) Velum:  (uvula pulls to right upon phonation)   Ice Chips Ice chips: Not tested   Thin Liquid Thin  Liquid: Impaired Presentation: Spoon Pharyngeal  Phase Impairments: Suspected delayed Swallow;Multiple swallows Other Comments: winces indicating pain with swallow    Nectar Thick   not tested  Honey Thick   not tested  Puree Puree: Impaired Pharyngeal Phase Impairments: Suspected delayed Swallow;Multiple swallows Other Comments: 1/2 tsp:  pt wincing to indicate pain   Solid   GO    Solid: Not tested       Donavan Burnet, MS [facility] SLP [phone]

## 2012-06-20 NOTE — Evaluation (Signed)
Occupational Therapy Evaluation Patient Details Name: April [name] MRN: [id number] DOB: Jul 25, 1935 Today's Date: 06/20/2012 Time: 4098-1191 OT Time Calculation (min): 28 min  OT Assessment / Plan / Recommendation Clinical Impression  This 77 year old female was admitted with perforated gastric ulcer. She is s/p exploratory lap and is in ICU.  Pt has a h/o falls.  She is appropriate for skilled OT to increase balance, strength and endurance to increase independence with adls/toilet transfers.      OT Assessment  Patient needs continued OT Services    Follow Up Recommendations  Skilled nursing facility    Barriers to Discharge      Equipment Recommendations  Defer to next venue    Recommendations for Other Services    Frequency  Min 2X/week    Precautions / Restrictions Precautions Precautions: Fall Precaution Comments: Drain on L side, NG tube, multiple lines/leads Restrictions Weight Bearing Restrictions: No   Pertinent Vitals/Pain No pain reported.  VSS, on 2 liters 02    ADL  Eating/Feeding: NPO Grooming: Simulated;Minimal assistance (manage multiple lines and leads) Where Assessed - Grooming: Supported sitting Upper Body Bathing: Simulated;Minimal assistance Where Assessed - Upper Body Bathing: Supported sitting Lower Body Bathing: Simulated;+2 Total assistance Lower Body Bathing: Patient Percentage:  (25%) Where Assessed - Lower Body Bathing: Supported sit to stand Upper Body Dressing: Simulated;Minimal assistance Where Assessed - Upper Body Dressing: Supported sitting Lower Body Dressing: Simulated;+2 Total assistance Lower Body Dressing: Patient Percentage: 20% Toilet Transfer: Simulated;+2 Total assistance Toilet Transfer: Patient Percentage: 30% Toilet Transfer Method: Stand pivot (pt leans posteriorly) Acupuncturist:  (bed to chair) Toileting - Architect and Hygiene: Simulated;+2 Total assistance Toileting - Architect  and Hygiene: Patient Percentage: 10% Where Assessed - Toileting Clothing Manipulation and Hygiene: Sit to stand from 3-in-1 or toilet Transfers/Ambulation Related to ADLs: spt to chair:  Pt has difficulty shifting weight to neutral ADL Comments: able to cross legs with assistance; usually bends forward for adls (had expl lap)    OT Diagnosis: Generalized weakness  OT Problem List: Decreased strength;Decreased activity tolerance;Impaired balance (sitting and/or standing);Decreased safety awareness;Decreased knowledge of use of DME or AE OT Treatment Interventions: Self-care/ADL training;Balance training;Patient/family education;DME and/or AE instruction;Therapeutic activities;energy conservation education   OT Goals Acute Rehab OT Goals OT Goal Formulation: With patient Time For Goal Achievement: 07/04/12 Potential to Achieve Goals: Good ADL Goals Pt Will Perform Upper Body Bathing: with supervision;Sitting, edge of bed;Unsupported ADL Goal: Upper Body Bathing - Progress: Goal set today Pt Will Perform Lower Body Bathing: with mod assist;Sit to stand from chair;with adaptive equipment ADL Goal: Lower Body Bathing - Progress: Goal set today Pt Will Transfer to Toilet: with mod assist;Stand pivot transfer ADL Goal: Toilet Transfer - Progress: Goal set today Pt Will Perform Toileting - Hygiene: with mod assist;Sit to stand from 3-in-1/toilet ADL Goal: Toileting - Hygiene - Progress: Goal set today Miscellaneous OT Goals Miscellaneous OT Goal #1: Pt will verbalize 2 energy conservation principles OT Goal: Miscellaneous Goal #1 - Progress: Goal set today  Visit Information  Last OT Received On: 06/20/12 Assistance Needed: +2 PT/OT Co-Evaluation/Treatment: Yes    Subjective Data  Subjective: I'd like to get up Patient Stated Goal: stronger   Prior Functioning  Vision/Perception  Prior Function Comments: has been falling Communication Communication: HOH      Cognition  Overall  Cognitive Status: Appears within functional limits for tasks assessed/performed Behavior During Session: [facility] for tasks performed Cognition - Other Comments: Pt a little  impulsive about getting OOB but followed verbal cues for waiting.    Extremity/Trunk Assessment Right Upper Extremity Assessment RUE ROM/Strength/Tone: Within functional levels Left Upper Extremity Assessment LUE ROM/Strength/Tone: Within functional levels   Mobility Bed Mobility Bed Mobility: Rolling Right;Right Sidelying to Sit Rolling Right: 3: Mod assist;With rail Right Sidelying to Sit: 1: +2 Total assist Right Sidelying to Sit: Patient Percentage: 30% Details for Bed Mobility Assistance: verbal cues for technique and safety, assist for both upper and lower body Transfers Sit to Stand: 1: +2 Total assist;From bed Sit to Stand: Patient Percentage: 40% Stand to Sit: To chair/3-in-1;1: +2 Total assist Stand to Sit: Patient Percentage: 30% Details for Transfer Assistance: pt with posterior lean upon standing and did not weight shift so sat back down and attempted again with better ability to perform trunk extension so transferred over to recliner with increased verbal cues for technique   Exercise    Balance    End of Session OT - End of Session Activity Tolerance: Patient limited by fatigue Patient left: in chair;with call bell/phone within reach Nurse Communication: Mobility status  GO     Minna Dumire 06/20/2012, 4:38 PM Marica Otter, OTR/L [phone] 06/20/2012

## 2012-06-20 NOTE — Progress Notes (Signed)
PARENTERAL NUTRITION CONSULT NOTE - Follow up  Pharmacy Consult for TNA Indication: s/p gastric surgery, PCM.  No Known Allergies  Patient Measurements: Height: 5\' 2"  (157.5 cm) Weight: 132 lb 4.4 oz (60 kg) IBW/kg (Calculated) : 50.1   Vital Signs: Temp: 98.4 F (36.9 C) (08/15 0600) Temp src: Core (Comment) (08/15 0400) BP: 154/50 mmHg (08/15 0600) Pulse Rate: 68  (08/15 0600) Intake/Output from previous day: 08/14 0701 - 08/15 0700 In: 2242.5 [I.V.:480; IV Piggyback:147.5; TPN:1615] Out: 2960 [Urine:2150; Emesis/NG output:800; Drains:10] Intake/Output from this shift:    Labs:  [facility] 06/20/12 0430  WBC 13.7*  HGB 9.7*  HCT 29.1*  PLT 387  APTT --  INR --    Basename 06/20/12 0430 06/19/12 0430 06/18/12 0545  NA 140 140 141  K 2.8* 3.5 3.8  CL 109 110 111  CO2 23 22 23   GLUCOSE 150* 117* 131*  BUN 37* 35* 36*  CREATININE 1.52* 1.56* 1.68*  LABCREA -- -- --  CREAT24HRUR -- -- --  CALCIUM 11.9* 11.5* 11.7*  MG 1.1* -- --  PHOS 1.8* -- --  PROT 5.5* -- --  ALBUMIN 1.8* -- --  AST 20 -- --  ALT 26 -- --  ALKPHOS 91 -- --  BILITOT 0.2* -- --  BILIDIR -- -- --  IBILI -- -- --  PREALBUMIN -- -- --  TRIG -- -- --  CHOLHDL -- -- --  CHOL -- -- --   Estimated Creatinine Clearance: 25.7 ml/min (by C-G formula based on Cr of 1.52).    Basename 06/20/12 0343 06/20/12 0023 06/19/12 1953  GLUCAP 132* 119* 139*    Medical History: Past Medical History  Diagnosis Date  . Diabetes mellitus   . Hyperthyroidism   . Hypertension     Medications:  Scheduled:     . ipratropium  0.5 mg Nebulization Q6H   And  . albuterol  2.5 mg Nebulization Q6H  . antiseptic oral rinse  15 mL Mouth Rinse QID  . chlorhexidine  15 mL Mouth Rinse BID  . heparin  5,000 Units Subcutaneous Q8H  . insulin aspart  0-15 Units Subcutaneous Q4H  . magnesium sulfate 1 - 4 g bolus IVPB  1 g Intravenous Once  . pantoprazole (PROTONIX) IV  40 mg Intravenous Q12H  .  piperacillin-tazobactam (ZOSYN)  IV  3.375 g Intravenous Q8H  . potassium phosphate IVPB (mmol)  30 mmol Intravenous Once  . sodium chloride  10-40 mL Intracatheter Q12H  . sodium chloride      . DISCONTD: potassium chloride  40 mEq Oral Q4H   Nutritional Goals:  per RD: 1250-1550Kcal/d and protein 69-82g/d.  Current nutrition:  NPO Climimix 5/15 @65ml /hr MIVF: NS @ KVO  CBGs & Insulin requirements past 24 hours: CBG 119-139 (50 units insulin per 2L TNA bag) Moderate SSI q4h (9 units used)  Labs:   Electrolytes - Corr Ca 13.6. Mag 1.1 - MD replaced with 1g Mag sulfate.  Also, both K+ and Phos were low this AM - MD replaced with 30 mmol of KPhos.  Will order 3 runs of KCl in addition since K+ 2.8 this AM and KPhos will only provide 45 mEq of potassium (not enough to correct).  SCr remains elevated, slowly trending down (chronic renal insufficiency, baseline SCr unknown)  LFTs - wnl  TGs - wnl  Prealbumin improving - 14.2 (8/12), 10.3 (8/8)  CBGs now controlled (<150 mg/dL) with insulin in TNA.   Assessment: [name] s/p repair of perforated  prepyloric ulcer on 06/10/12. TNA started 8/7 d/t need for bowel rest.  Remains on TNA for continued post-op ileus.  NG output was recorded at 800 ml yesterday.  Surgery indicates that pt improving and would like to take NG out and begin PO intake pending a swallow study.  Swallow study suggests conservative tx plan, keeping pt NPO with oral care.  Speech suspects that pt's aspiration risk will be chronic and would place Panda tube if able for tube feeding.  Will continue with TNA for now until further orders from MD.  Plan is to re-assess in AM for [facility].  Plan:  Continue with the electrolyte free formula (Clinimix 5/15) due to elevated Ca. Continue Clinimix 5/15 at 65 ml/hr.  Clinimix 5/15 at goal rate 65 ml/hr + lipids at 26ml/hr on MWF will provide:  78g/day protein and 1587 Kcal/day MWF, 1107 Kcal/day STTHS (Avg. 1312 Kcal/day  weekly).  Continue with insulin in TNA at 50 units per 2L bag.  Estimated that patient will receive ~39 units over 24 hours at TNA rate of 65 ml/hr.    Fat emulsion at 42ml/hr (MWF only due to ongoing shortage).   MIVF per MD.  TNA to contain standard multivitamins and trace elements(MWF only due to ongoing shortage).  TNA lab panels on Mondays & Thursdays.  F/u BMET in AM and plan for possible Panda placement.  Clance Boll, PharmD, BCPS Pager: [phone] 06/20/2012 1:41 PM

## 2012-06-20 NOTE — Progress Notes (Signed)
Name: April [name] MRN: [id number] DOB: [birth year]    LOS: 10  Referring Provider:  CCS Reason for Referral:  Acute respiratory failure  PULMONARY / CRITICAL CARE MEDICINE   Brief patient description:  77 yo female smoker admitted 06/10/2012 abdominal pain and pneumoperitoneum from perforated pre-pyloric gastric ulcer.  Had emergent laparotomy with repair of gastric ulcer and omental patch on 8/05.  She developed acute hypoxic respiratory failure 8/07 and PCCM consulted. PMHx of CKD, DM, Hyperparathyroidism, HTN  Events Since Admission: 8/5  Admitted with gastric ulcer perforation, exploratory lap with ulcer repair 8/7  Developed respiratory distress, intubated > ext 8/9 am 8/9 pm resp distress, re-et  Current Status: Had good response to diuresis yesterday.  Feels breathing better.  Slept well.  Vital Signs: Temp:  [98.1 F (36.7 C)-100 F (37.8 C)] 98.4 F (36.9 C) (08/15 0600) Pulse Rate:  [64-81] 68  (08/15 0600) Resp:  [25-30] 26  (08/15 0600) BP: (98-154)/(39-50) 154/50 mmHg (08/15 0600) SpO2:  [90 %-100 %] 99 % (08/15 0600) FiO2 (%):  [30 %-50 %] 30 % (08/15 0400)  Intake/Output Summary (Last 24 hours) at 06/20/12 0755 Last data filed at 06/20/12 0600  Gross per 24 hour  Intake 2242.5 ml  Output   2960 ml  Net -717.5 ml  30 % venturi mask      Physical Examination: General:  No distress Neuro:  Alert, follows commands HEENT:  NG tube in place Neck:  supple Cardiovascular:  S1, S2 regular Lungs:  Scattered rhonchi, no wheeze Abdomen:  Wound dressing clean, JP intact  Musculoskeletal:  No edema Skin:  Intact  Dg Chest Port 1 View  06/19/2012  *RADIOLOGY REPORT*  Clinical Data: Follow-up evaluation of atelectasis.  PORTABLE CHEST - 1 VIEW  Comparison: Chest x-ray 06/17/2012.  Findings: A nasogastric tube is seen extending into the stomach, however, the tip of the nasogastric tube extends below the lower margin of the image. There is a right upper extremity  PICC with tip terminating in the proximal superior vena cava. Lung volumes are low.  There continue to be bibasilar opacities which may represent areas of atelectasis and/or consolidation with superimposed small bilateral pleural effusions.  Overall, aeration is slightly worsened throughout the lung bases bilaterally.  Mild pulmonary venous congestion without frank pulmonary edema.  Heart size is within normal limits. The patient is rotated to the left on today's exam, resulting in distortion of the mediastinal contours and reduced diagnostic sensitivity and specificity for mediastinal pathology.  Atherosclerosis in the thoracic aorta.  Surgical clips project over the right upper quadrant of the abdomen, consistent with prior cholecystectomy.  IMPRESSION: 1.  Slight worsening aeration throughout the lung bases bilaterally.  These findings presumably reflect a combination of atelectasis and/or consolidation with superimposed small bilateral pleural effusions. 2.  Support apparatus, as above. 3.  Atherosclerosis.  Original Report Authenticated By: Florencia Reasons, M.D.   Dg Kayleen Memos W/water Sol Cm  06/18/2012  *RADIOLOGY REPORT*  Clinical Data:  Postop day #8 surgical repair of a perforated duodenal ulcer.  UPPER GI SERIES WITH KUB  Technique:  Routine upper GI series was performed with 85 ml Omnipaque-300 injected through an NG tube  Fluoroscopy Time: 1.2 minutes  Comparison:  CT 06/10/2012  Findings: NG tube is present in the stomach extending into the duodenum.  Normal bowel gas pattern.  Contrast was injected through the NG tube.  The stomach is collapsed and normal-appearing.  Contrast readily enters the duodenum and proximal jejunum.  No bowel obstruction.  No contrast leak into the peritoneal cavity.  IMPRESSION: Negative upper GI.  No evidence of leak or obstruction.  Original Report Authenticated By: Camelia Phenes, M.D.    ASSESSMENT AND PLAN  PULMONARY  Lab 06/16/12 0352  PHART 7.384  PCO2ART 40.8   PO2ART 80.3  HCO3 23.6  O2SAT 94.9   ETT:  8/7 > 8/9 ETT 8/9 >>>8/12  8/05 CT chest>>diffuse atherosclerosis, diffuse centrilobular emphysema, basilar ATX, spinal DJD with mild scoliosis, 7 mm LUL nodule  A:  Acute hypoxemic respiratory failure>>likely from pulmonary edema in setting of probable COPD, and decreased abd compliance after laparotomy>>improved 8/15 Pulmonary nodule. P:   Continue scheduled bronchodilators Bronchial hygiene D/c BPAP Mobilize as tolerated Keep even to negative fluid balance F/u CXR as needed Will need outpt f/u for Lt upper lobe nodule Will need further outpt eval for probable COPD  CARDIOVASCULAR  Lab 06/13/12 1115 06/13/12 0857 06/13/12 0856  TROPONINI -- -- <0.30  LATICACIDVEN -- 0.8 --  PROBNP 463.8* -- --   Lines: R PICC 8/6 >>>  2 d echo 8/8: EF 55 to 60%, grade 1 diastolic dysfx  A: Shock>>resolved.   Likely acute on chronic diastolic dysfx with acute pulmonary edema>>improved.   Hx of HTN.   Diffuse atherosclerosis on CT chest. P:  Will likely need cardiology evaluation at some point for CAD  RENAL  Lab 06/20/12 0430 06/19/12 0430 06/18/12 0545 06/17/12 0510 06/16/12 0513 06/14/12 0500  NA 140 140 141 140 139 --  K 2.8* 3.5 -- -- -- --  CL 109 110 111 110 108 --  CO2 23 22 23 24 23  --  BUN 37* 35* 36* 38* 35* --  CREATININE 1.52* 1.56* 1.68* 1.82* 1.86* --  CALCIUM 11.9* 11.5* 11.7* 11.8* 10.8* --  MG 1.1* -- -- 1.7 -- 1.7  PHOS 1.8* -- -- 3.7 -- 2.9   Intake/Output      08/14 0701 - 08/15 0700 08/15 0701 - 08/16 0700   I.V. (mL/kg) 480 (8)    IV Piggyback 147.5    TPN 1615    Total Intake(mL/kg) 2242.5 (37.4)    Urine (mL/kg/hr) 2150 (1.5)    Emesis/NG output 800    Drains 10    Total Output 2960    Net -717.5          Foley:  8/5 >>>  A: Acute renal failure with hx of CKD.  Baseline creatinine 1.85 (followed by Dr. Lowell Guitar as outpt). Hypokalemia, hypophosphatemia, hypomagnesemia. P: F/u renal fx Replace  electrolytes and f/u Monitor urine outpt Goal even to negative fluid balance KVO IV fluids  GASTROINTESTINAL  Lab 06/20/12 0430 06/17/12 0510 06/15/12 0514 06/14/12 0500 06/13/12 0900  AST 20 20 20 21 19   ALT 26 17 20 19 21   ALKPHOS 91 63 67 106 54  BILITOT 0.2* 0.3 0.3 0.4 0.3  PROT 5.5* 5.2* 5.3* 4.7* 4.6*  ALBUMIN 1.8* 1.7* 1.8* 1.6* 1.7*   A:  Perforated gastric ulcer s/p repair. P:   Post-op care per CCS TNA for nutrition until able to use gut>>defer timing to surgery For swallow eval per surgery Protonix q12h  HEMATOLOGIC  Lab 06/20/12 0430 06/17/12 0510 06/15/12 0514 06/14/12 0500  HGB 9.7* 9.2* 10.0* 9.5*  HCT 29.1* 28.2* 30.4* 27.8*  PLT 387 234 167 153  INR -- -- -- --  APTT -- -- -- --   A:  Anemia of critical illness. P:  F/u CBC Transfuse for Hb <  7  INFECTIOUS  Lab 06/20/12 0430 06/17/12 0510 06/15/12 0514 06/14/12 0500  WBC 13.7* 8.7 6.6 9.7  PROCALCITON -- 3.07 12.97 25.60   Cultures: 8/5  Blood x2>>negative 8/5 Urine >>negative 8/6 Urine >>negative 8/8 Respiratory>>oral flora  Antibiotics: Ertapenem 8/5>>8/5 8/5  Zosyn >>> 8/7  Vancomycin >>>8/9  A:  Peritonitis for perforated gastric ulcer. P:   Continue zosyn per GI>>Day 11/x can likely d/c soon if okay with surgery   ENDOCRINE  Lab 06/20/12 0023 06/19/12 1953 06/19/12 1539 06/19/12 1124 06/19/12 0752  GLUCAP 119* 139* 137* 131* 120*   A:  DM type II with hyperglycemia.   History of hyperthyroidism, not on treatment. Hypercalcemia. P:   SSI May need input from renal or endocrine if calcium remains persistently elevated  NEUROLOGIC  A:  Acute encephalopathy (hypoxia, sedation)>>resolved.   Postop pain. P:   PRN fentanyl   BEST PRACTICE / DISPOSITION Level of Care:  ICU Primary Service:  CCS Consultants:  PCCM, PT/OT, Speech Code Status:  Full Diet:  NPO / TNA DVT Px:  Heparin GI Px:  Protonix Skin Integrity:  Intact Social / Family:  No family at bedside  Okay  to transfer to med-surg unit if okay with CCS.  PCCM will continue to follow on floor.  Coralyn Helling, MD [facility] Pulmonary/Critical Care 06/20/2012, 7:55 AM Pager:  [phone] After 3pm call: [phone]

## 2012-06-20 NOTE — Progress Notes (Signed)
Hypokalemia   Replaced  

## 2012-06-20 NOTE — Clinical Social Work Note (Signed)
CSW attempted to visit with Pt to discuss PT and OT recs for SNF. Pt sleeping, did not awake. CSW left message for son. CSW will continue to follow as Pt needs SNF placement.   Doreen Salvage, LCSWA Clinical Social Worker [facility] Cell [phone]

## 2012-06-20 NOTE — Progress Notes (Signed)
10 Days Post-Op  Subjective: Feels OK and not having pain. N?G in place  Objective: Vital signs in last 24 hours: Temp:  [98.1 F (36.7 C)-99.9 F (37.7 C)] 98.4 F (36.9 C) (08/15 0600) Pulse Rate:  [64-81] 68  (08/15 0600) Resp:  [25-30] 26  (08/15 0600) BP: (98-154)/(39-50) 154/50 mmHg (08/15 0600) SpO2:  [90 %-100 %] 96 % (08/15 0833) FiO2 (%):  [30 %-50 %] 30 % (08/15 0400)   Intake/Output from previous day: 08/14 0701 - 08/15 0700 In: 2242.5 [I.V.:480; IV Piggyback:147.5; TPN:1615] Out: 2960 [Urine:2150; Emesis/NG output:800; Drains:10] Intake/Output this shift:     General appearance: alert, fatigued and no distress GI: soft, non-tender; bowel sounds normal; no masses,  no organomegaly  Incision: healing well, Minimal drainage from JP  Lab Results:   Basename 06/20/12 0430  WBC 13.7*  HGB 9.7*  HCT 29.1*  PLT 387   BMET  Basename 06/20/12 0430 06/19/12 0430  NA 140 140  K 2.8* 3.5  CL 109 110  CO2 23 22  GLUCOSE 150* 117*  BUN 37* 35*  CREATININE 1.52* 1.56*  CALCIUM 11.9* 11.5*   PT/INR No results found for this basename: LABPROT:2,INR:2 in the last 72 hours ABG No results found for this basename: PHART:2,PCO2:2,PO2:2,HCO3:2 in the last 72 hours  MEDS, Scheduled    . ipratropium  0.5 mg Nebulization Q6H   And  . albuterol  2.5 mg Nebulization Q6H  . antiseptic oral rinse  15 mL Mouth Rinse QID  . chlorhexidine  15 mL Mouth Rinse BID  . furosemide  20 mg Intravenous Once  . heparin  5,000 Units Subcutaneous Q8H  . insulin aspart  0-15 Units Subcutaneous Q4H  . magnesium sulfate 1 - 4 g bolus IVPB  1 g Intravenous Once  . pantoprazole (PROTONIX) IV  40 mg Intravenous Q12H  . piperacillin-tazobactam (ZOSYN)  IV  3.375 g Intravenous Q8H  . potassium phosphate IVPB (mmol)  30 mmol Intravenous Once  . sodium chloride  10-40 mL Intracatheter Q12H  . sodium chloride      . DISCONTD: potassium chloride  40 mEq Oral Q4H    Studies/Results: Dg  Chest Port 1 View  06/19/2012  *RADIOLOGY REPORT*  Clinical Data: Follow-up evaluation of atelectasis.  PORTABLE CHEST - 1 VIEW  Comparison: Chest x-ray 06/17/2012.  Findings: A nasogastric tube is seen extending into the stomach, however, the tip of the nasogastric tube extends below the lower margin of the image. There is a right upper extremity PICC with tip terminating in the proximal superior vena cava. Lung volumes are low.  There continue to be bibasilar opacities which may represent areas of atelectasis and/or consolidation with superimposed small bilateral pleural effusions.  Overall, aeration is slightly worsened throughout the lung bases bilaterally.  Mild pulmonary venous congestion without frank pulmonary edema.  Heart size is within normal limits. The patient is rotated to the left on today's exam, resulting in distortion of the mediastinal contours and reduced diagnostic sensitivity and specificity for mediastinal pathology.  Atherosclerosis in the thoracic aorta.  Surgical clips project over the right upper quadrant of the abdomen, consistent with prior cholecystectomy.  IMPRESSION: 1.  Slight worsening aeration throughout the lung bases bilaterally.  These findings presumably reflect a combination of atelectasis and/or consolidation with superimposed small bilateral pleural effusions. 2.  Support apparatus, as above. 3.  Atherosclerosis.  Original Report Authenticated By: Florencia Reasons, M.D.   Dg Kayleen Memos W/water Sol Cm  06/18/2012  *RADIOLOGY REPORT*  Clinical Data:  Postop day #8 surgical repair of a perforated duodenal ulcer.  UPPER GI SERIES WITH KUB  Technique:  Routine upper GI series was performed with 85 ml Omnipaque-300 injected through an NG tube  Fluoroscopy Time: 1.2 minutes  Comparison:  CT 06/10/2012  Findings: NG tube is present in the stomach extending into the duodenum.  Normal bowel gas pattern.  Contrast was injected through the NG tube.  The stomach is collapsed and  normal-appearing.  Contrast readily enters the duodenum and proximal jejunum.  No bowel obstruction.  No contrast leak into the peritoneal cavity.  IMPRESSION: Negative upper GI.  No evidence of leak or obstruction.  Original Report Authenticated By: Camelia Phenes, M.D.    Assessment: s/p Procedure(s): EXPLORATORY LAPAROTOMY Patient Active Problem List  Diagnosis  . HTN (hypertension)  . Hyperlipidemia  . Benign tumor of pituitary gland  . Polyp of colon  . Hyperparathyroidism, primary  . DM (diabetes mellitus)  . Hypercalcemia  . CRF (chronic renal failure)  . Perforated gastric ulcer  . Lung nodule  . Hypomagnesemia  . Leukocytosis  . COPD (chronic obstructive pulmonary disease)  . Acute respiratory failure with hypoxia  . Acute encephalopathy  . Aspiration pneumonia    Improving surgically. Swallow study pending. If OK can take NG out and begin PO intake. If not, can start tube feed. Will get JP out  Plan: Will transfer to floor, get JP out. Await swallow study   LOS: 10 days     Currie Paris, MD, [facility], [place] [phone]   06/20/2012 10:00 AM

## 2012-06-20 NOTE — Clinical Social Work Psychosocial (Signed)
Clinical Social Work Department BRIEF PSYCHOSOCIAL ASSESSMENT 06/20/2012  Patient:  April [name], April [name]     Account Number:  [id number]     Admit date:  06/10/2012  Clinical Social Worker:  Jodelle Red  Date/Time:  06/20/2012 11:30 AM  Referred by:  CSW  Date Referred:  06/20/2012 Referred for  SNF Placement   Other Referral:   Interview type:  Patient Other interview type:   CHART REVIEW, DISCUSSION WITH RN    PSYCHOSOCIAL DATA Living Status:  FAMILY Admitted from facility:   Level of care:   Primary support name:  SAMMY Kasperski Primary support relationship to patient:  CHILD, ADULT Degree of support available:   GOOD FROM SON, BUT HE WORKS DURING THE DAY    CURRENT CONCERNS Current Concerns  Post-Acute Placement   Other Concerns:    SOCIAL WORK ASSESSMENT / PLAN CSW met with Pt at bedside to further discuss need for SNF per PT recs. Pt stated understanding and that she "couldn't be home alone". She states she lives with her son, but he works during the day. She is agreeable to SNF search and CSW explained the process. CSW provided list and feels the need to speak with family further, as Pt may not have complete understanding. CSw left message for son.   Assessment/plan status:  Psychosocial Support/Ongoing Assessment of Needs Other assessment/ plan:   Information/referral to community resources:   SNF search    PATIENT'S/FAMILY'S RESPONSE TO PLAN OF CARE: Pt agrees to SNF search. CSW will speak with son, as well to assist with this process. CSW to follow and begin SNF search.    Doreen Salvage, LCSWA Clinical Social Worker [facility] Cell [phone]

## 2012-06-21 LAB — BASIC METABOLIC PANEL
Calcium: 11.4 mg/dL — ABNORMAL HIGH (ref 8.4–10.5)
Chloride: 109 mEq/L (ref 96–112)
Creatinine, Ser: 1.53 mg/dL — ABNORMAL HIGH (ref 0.50–1.10)
GFR calc Af Amer: 38 mL/min — ABNORMAL LOW (ref >90)
GFR calc non Af Amer: 32 mL/min — ABNORMAL LOW (ref >90)

## 2012-06-21 LAB — CBC
Platelets: 431 10*3/uL — ABNORMAL HIGH (ref 150–400)
RDW: 14 % (ref 11.5–15.5)
WBC: 12.3 10*3/uL — ABNORMAL HIGH (ref 4.0–10.5)

## 2012-06-21 LAB — GLUCOSE, CAPILLARY
Glucose-Capillary: 115 mg/dL — ABNORMAL HIGH (ref 70–99)
Glucose-Capillary: 144 mg/dL — ABNORMAL HIGH (ref 70–99)
Glucose-Capillary: 171 mg/dL — ABNORMAL HIGH (ref 70–99)

## 2012-06-21 LAB — MAGNESIUM: Magnesium: 1.3 mg/dL — ABNORMAL LOW (ref 1.5–2.5)

## 2012-06-21 LAB — PHOSPHORUS: Phosphorus: 2.2 mg/dL — ABNORMAL LOW (ref 2.3–4.6)

## 2012-06-21 MED ORDER — ZINC TRACE METAL 1 MG/ML IV SOLN
INTRAVENOUS | Status: AC
  Administered 2012-06-21: 18:00:00 via INTRAVENOUS
  Filled 2012-06-21: qty 2000

## 2012-06-21 MED ORDER — POTASSIUM PHOSPHATE DIBASIC 3 MMOLE/ML IV SOLN
15.0000 mmol | Freq: Once | INTRAVENOUS | Status: AC
  Administered 2012-06-21: 15 mmol via INTRAVENOUS
  Filled 2012-06-21: qty 5

## 2012-06-21 MED ORDER — POTASSIUM CHLORIDE 10 MEQ/100ML IV SOLN
10.0000 meq | INTRAVENOUS | Status: AC
  Administered 2012-06-21 (×4): 10 meq via INTRAVENOUS
  Filled 2012-06-21 (×4): qty 100

## 2012-06-21 MED ORDER — MAGNESIUM SULFATE 40 MG/ML IJ SOLN
2.0000 g | Freq: Once | INTRAMUSCULAR | Status: AC
  Administered 2012-06-21: 2 g via INTRAVENOUS
  Filled 2012-06-21: qty 50

## 2012-06-21 MED ORDER — FAT EMULSION 20 % IV EMUL
250.0000 mL | INTRAVENOUS | Status: AC
  Administered 2012-06-21: 250 mL via INTRAVENOUS
  Filled 2012-06-21: qty 250

## 2012-06-21 NOTE — Progress Notes (Signed)
PARENTERAL NUTRITION CONSULT NOTE   Pharmacy Consult for TNA Indication: s/p gastric surgery, PCM.  No Known Allergies  Patient Measurements: Height: 5\' 2"  (157.5 cm) Weight: 132 lb 4.4 oz (60 kg) IBW/kg (Calculated) : 50.1   Vital Signs: Temp: 97.9 F (36.6 C) (08/15 2200) Temp src: Oral (08/15 2200) BP: 102/46 mmHg (08/15 2200) Pulse Rate: 60  (08/15 2200) Intake/Output from previous day: 08/15 0701 - 08/16 0700 In: 2910.8 [I.V.:229.5; IV Piggyback:960; TPN:1721.3] Out: 1385 [Urine:1085; Emesis/NG output:300] Intake/Output from this shift: Total I/O In: 1165.8 [I.V.:119.5; IV Piggyback:150; TPN:896.3] Out: 350 [Urine:350]  Labs:  [facility] 06/21/12 0445 06/20/12 0430  WBC 12.3* 13.7*  HGB 9.5* 9.7*  HCT 28.3* 29.1*  PLT 431* 387  APTT -- --  INR -- --    Basename 06/21/12 0445 06/20/12 0430 06/19/12 0430  NA 138 140 140  K 3.3* 2.8* 3.5  CL 109 109 110  CO2 19 23 22   GLUCOSE 122* 150* 117*  BUN 37* 37* 35*  CREATININE 1.53* 1.52* 1.56*  LABCREA -- -- --  CREAT24HRUR -- -- --  CALCIUM 11.4* 11.9* 11.5*  MG 1.3* 1.1* --  PHOS 2.2* 1.8* --  PROT -- 5.5* --  ALBUMIN -- 1.8* --  AST -- 20 --  ALT -- 26 --  ALKPHOS -- 91 --  BILITOT -- 0.2* --  BILIDIR -- -- --  IBILI -- -- --  PREALBUMIN -- -- --  TRIG -- -- --  CHOLHDL -- -- --  CHOL -- -- --  Corrected Calcium: 13.2 Estimated Creatinine Clearance: 25.5 ml/min (by C-G formula based on Cr of 1.53).    Basename 06/21/12 0343 06/20/12 2355 06/20/12 1958  GLUCAP 119* 96 133*    Medical History: Past Medical History  Diagnosis Date  . Diabetes mellitus   . Hyperthyroidism   . Hypertension     Medications:  Scheduled:     . ipratropium  0.5 mg Nebulization Q6H   And  . albuterol  2.5 mg Nebulization Q6H  . antiseptic oral rinse  15 mL Mouth Rinse QID  . chlorhexidine  15 mL Mouth Rinse BID  . heparin  5,000 Units Subcutaneous Q8H  . insulin aspart  0-15 Units Subcutaneous Q4H  .  magnesium sulfate 1 - 4 g bolus IVPB  1 g Intravenous Once  . pantoprazole (PROTONIX) IV  40 mg Intravenous Q12H  . piperacillin-tazobactam (ZOSYN)  IV  3.375 g Intravenous Q8H  . potassium chloride  10 mEq Intravenous Q1 Hr x 3  . potassium phosphate IVPB (mmol)  30 mmol Intravenous Once  . sodium chloride  10-40 mL Intracatheter Q12H  . DISCONTD: potassium chloride  40 mEq Oral Q4H   Nutritional Goals:  per RD: 1250-1550Kcal/d and protein 69-82g/d.  Current nutrition:  NPO Climimix 5/15 @65ml /hr MIVF: NS @ KVO  CBGs & Insulin requirements past 24 hours: CBG 96 - 163 (50 units insulin per 2L TNA bag) Moderate SSI q4h (8 units used)  Labs:   Electrolytes -   Calcium remains high despite using electrolyte-free TNA formula   Mag improved but still low  Phos improved but still low  K improved but still slightly low.  SCr remains elevated but stable (chronic renal insufficiency, baseline SCr unknown)  LFTs - wnl on 8/15  TG - wnl on 8/12  Prealbumin improving - 14.2 (8/12), 10.3 (8/8)  CBGs now controlled with insulin in TNA + sliding scale coverage  Assessment:  76yo F s/p repair of perforated prepyloric  ulcer on 06/10/12. TNA started 8/7 d/t need for bowel rest.   Remains NPO except ice chips based on findings from swallowing study  Awaiting word on possible Panda placement   Plan:  Mag Sulfate 2 grams IV x 1  K Phos 15 mM IV x 1  Continue with electrolyte-free formula (Clinimix 5/15) due to elevated Ca. Continue goal rate of 65 ml/hr.  Clinimix 5/15 at goal rate 65 ml/hr + lipids at 51ml/hr on MWF provides:  78g/day protein and 1587 Kcal/day MWF, 1107 Kcal/day STTHS (Avg. 1312 Kcal/day weekly).  Continue with insulin in TNA at 50 units per 2L bag (=25 units/L).   Patient receives 39 units/day with TNA rate at 18mL/hr.   Continue sliding scale insulin coverage also.  Continue Fat emulsion at 33ml/hr (MWF only due to ongoing shortage).   Multivitamins and  trace elements (MWF only due to ongoing shortage).  Maintenance IVF per MD  BMet, Mg, Phos tomorrow.  Await word on possible Panda placement.  Hope Budds, PharmD, BCPS Pager: [phone] 06/21/2012 6:59 AM

## 2012-06-21 NOTE — Progress Notes (Signed)
Patient ID: April [name], female   DOB: 22-Jun-1935, 77 y.o.   MRN: [id number] 11 Days Post-Op  Subjective: Pt feels well today.  No complaints.  Objective: Vital signs in last 24 hours: Temp:  [97.9 F (36.6 C)-99 F (37.2 C)] 98.2 F (36.8 C) (08/16 0600) Pulse Rate:  [60-73] 64  (08/16 0600) Resp:  [18-23] 18  (08/16 0600) BP: (102-118)/(40-62) 103/60 mmHg (08/16 0600) SpO2:  [94 %-100 %] 94 % (08/16 0847) Last BM Date: 06/15/12  Intake/Output from previous day: 08/15 0701 - 08/16 0700 In: 2910.8 [I.V.:229.5; IV Piggyback:960; TPN:1721.3] Out: 1785 [Urine:1485; Emesis/NG output:300] Intake/Output this shift:    PE: Abd: soft, wound is clean, drain with just serous output.  Few BS Heart: reg Lungs: few rhonchi  Lab Results:   Basename 06/21/12 0445 06/20/12 0430  WBC 12.3* 13.7*  HGB 9.5* 9.7*  HCT 28.3* 29.1*  PLT 431* 387   BMET  Basename 06/21/12 0445 06/20/12 0430  NA 138 140  K 3.3* 2.8*  CL 109 109  CO2 19 23  GLUCOSE 122* 150*  BUN 37* 37*  CREATININE 1.53* 1.52*  CALCIUM 11.4* 11.9*   PT/INR No results found for this basename: LABPROT:2,INR:2 in the last 72 hours CMP     Component Value Date/Time   NA 138 06/21/2012 0445   K 3.3* 06/21/2012 0445   CL 109 06/21/2012 0445   CO2 19 06/21/2012 0445   GLUCOSE 122* 06/21/2012 0445   BUN 37* 06/21/2012 0445   CREATININE 1.53* 06/21/2012 0445   CALCIUM 11.4* 06/21/2012 0445   PROT 5.5* 06/20/2012 0430   ALBUMIN 1.8* 06/20/2012 0430   AST 20 06/20/2012 0430   ALT 26 06/20/2012 0430   ALKPHOS 91 06/20/2012 0430   BILITOT 0.2* 06/20/2012 0430   GFRNONAA 32* 06/21/2012 0445   GFRAA 38* 06/21/2012 0445   Lipase  No results found for this basename: lipase       Studies/Results: No results found.  Anti-infectives: Anti-infectives     Start     Dose/Rate Route Frequency Ordered Stop   06/13/12 2200   vancomycin (VANCOCIN) 500 mg in sodium chloride 0.9 % 100 mL IVPB  Status:  Discontinued        500  mg 100 mL/hr over 60 Minutes Intravenous Every 24 hours 06/12/12 2218 06/15/12 0840   06/12/12 2230   vancomycin (VANCOCIN) 750 mg in sodium chloride 0.9 % 150 mL IVPB        750 mg 150 mL/hr over 60 Minutes Intravenous  Once 06/12/12 2218 06/12/12 2330   06/11/12 1800  piperacillin-tazobactam (ZOSYN) IVPB 3.375 g       3.375 g 12.5 mL/hr over 240 Minutes Intravenous Every 8 hours 06/11/12 1343     06/11/12 0200   piperacillin-tazobactam (ZOSYN) IVPB 3.375 g  Status:  Discontinued        3.375 g 12.5 mL/hr over 240 Minutes Intravenous Every 8 hours 06/11/12 0133 06/11/12 1341   06/10/12 1945   ertapenem (INVANZ) 1 g in sodium chloride 0.9 % 50 mL IVPB        1 g 100 mL/hr over 30 Minutes Intravenous  Once 06/10/12 1930 06/10/12 2023           Assessment/Plan  1. S/p repair of perforated gastric ulcer 2. VDRF, resolved 3. COPD 4. Lung nodule 5. Deconditioning  Plan: 1. Spoke with [name] with SLP and patient did pass swallow study today with NGT dc.  She will be started on clear  liquids 2. Spoke with Dr. Craige Cotta today.  Concern about Zosyn.  She has had it for 12 days.  Doubt she still needs it.  Will d/w Dr. Jamey Ripa.  If he agrees, we will dc. 3. SW consult for SNF placement. 4. Appreciate CCM assistance with pulm and medicine issues.  LOS: 11 days    April [name] E 06/21/2012

## 2012-06-21 NOTE — Progress Notes (Signed)
Dressing change done at 1215. Wet to Dry. Pt tolerated well. Old dressing was clean and dry upon removal.   MCCLAIN, Krislynn Gronau L 06/21/2012 12:54 PM

## 2012-06-21 NOTE — Evaluation (Signed)
Clinical/Bedside Swallow Evaluation Patient Details  Name: April [name] MRN: [id number] Date of Birth: Mar 20, 1935  Today's Date: 06/21/2012 Time: 4098-1191 SLP Time Calculation (min): 21 min  Past Medical History:  Past Medical History  Diagnosis Date  . Diabetes mellitus   . Hyperthyroidism   . Hypertension    Past Surgical History:  Past Surgical History  Procedure Date  . 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 Laparotomy 06/10/2012    Procedure: EXPLORATORY LAPAROTOMY;  Surgeon: Adolph Pollack, MD;  Location: [facility] ORS;  Service: General;  Laterality: N/A;  closure of perforated gastric ulcer   HPI:  77 yo female adm to [facility] with perforated ulcer, underwent emergent surgery 06/10/12, developed respiratory failure 8/7-8/9, reintubated 8/9 pm to 06/17/12.  PMH + for COPD, asp pna, HTN, CRF, DM, hyperparathyroidism, acute resp fx with hypoxia, acute encephalopathy.  Pt did not pass a BSE yesterday, likely largely due to NG placement in petite pt.    Assessment / Plan / Recommendation Clinical Impression  Much improved swallow and phonatory ability-likely due to NG removal.  No clinical s/s of aspiration with limited po observed (few bites of cracker, 3 tsps applesauce, apple juice x1 ounce)-  Rec initiate po diet with strict precautions - swallow is better today, but cognitively pt is disoriented and stated she was seeing "sand" outside.  RN informed. SLP to follow to monitor tolerance and to educate pt to asp precautions given her COPD possibly impacting swallow coordination (especially given pt with cognitive issues today).      Aspiration Risk  Mild    Diet Recommendation  (clear liquid per SLP conversation with CCS PA)   Medication Administration: Crushed with puree (whole if small, follow w/ water) Supervision: Full supervision/cueing for compensatory strategies Compensations: Small sips/bites;Slow rate;Check for pocketing Postural Changes and/or Swallow Maneuvers: Seated  upright 90 degrees;Upright 30-60 min after meal (allow rest break if short of breath)    Other  Recommendations Oral Care Recommendations: Oral care BID   Follow Up Recommendations       Frequency and Duration min 2x/week  1 week   Pertinent Vitals/Pain Afebrile, decreased    SLP Swallow Goals Patient will utilize recommended strategies during swallow to increase swallowing safety with: Moderate assistance Swallow Study Goal #3 - Progress: Met Swallow Study Goal #4 - Progress: Other (comment) (voice did not appear wet today)   Swallow Study Prior Functional Status       General Date of Onset: 06/20/12 HPI: 77 yo female adm to [facility] with perforated ulcer, underwent emergent surgery 06/10/12, developed respiratory failure 8/7-8/9, reintubated 8/9 pm to 06/17/12.  PMH + for COPD, asp pna, HTN, CRF, DM, hyperparathyroidism, acute resp fx with hypoxia, acute encephalopathy.  Pt did not pass a BSE yesterday, likely largely due to NG placement in petite pt.  Previous Swallow Assessment: none Diet Prior to this Study: NPO Respiratory Status: Supplemental O2 delivered via (comment) (3 liters) History of Recent Intubation: Yes Length of Intubations (days): 7 days Date extubated: 06/17/12 Behavior/Cognition: Alert;Cooperative;Hard of hearing;Requires cueing Oral Cavity - Dentition: Dentures, top;Dentures, bottom (placed by SLP after brushed oral cavity and dentures) Self-Feeding Abilities: Needs assist (pt spilling food from anterior oral and liquids from cup) Baseline Vocal Quality: Hoarse;Low vocal intensity (improved phonation) Volitional Cough: Strong Volitional Swallow: Able to elicit    Oral/Motor/Sensory Function Overall Oral Motor/Sensory Function: Impaired (general weakness)   Ice Chips Ice chips: Not tested   Thin Liquid Thin Liquid:  Within functional limits Presentation: Straw;Cup;Self Fed (with assist)    Nectar Thick Nectar Thick Liquid: Not tested   Honey Thick Honey Thick  Liquid: Not tested   Puree Puree: Impaired Presentation: Spoon;Self Fed Oral Phase Functional Implications: Left anterior spillage (mild, ? due to arm/hand weakness)   Solid   GO    Solid: Within functional limits Presentation: Self Lisabeth Pick, MS [facility] SLP [phone]

## 2012-06-21 NOTE — Progress Notes (Signed)
See note per KO,PA. She has made significant progress last 48 hours. N/G out, she is alert and comfortable, starting liquids. Should be able to advance. JP can come out

## 2012-06-21 NOTE — Progress Notes (Signed)
Name: April [name] MRN: [id number] DOB: Jan 27, 1935    LOS: 11  Referring Provider:  CCS Reason for Referral:  Acute respiratory failure  PULMONARY / CRITICAL CARE MEDICINE   Brief patient description:  77 yo female smoker admitted 06/10/2012 abdominal pain and pneumoperitoneum from perforated pre-pyloric gastric ulcer.  Had emergent laparotomy with repair of gastric ulcer and omental patch on 8/05.  She developed acute hypoxic respiratory failure 8/07 and PCCM consulted. PMHx of CKD, DM, Hyperparathyroidism, HTN  Events Since Admission: 8/5  Admitted with gastric ulcer perforation, exploratory lap with ulcer repair 8/7  Developed respiratory distress, intubated > ext 8/9 am 8/9 pm resp distress, re-et  Current Status: No issues overnight.  Denies chest pain.  Breathing okay.  Vital Signs: Temp:  [97.9 F (36.6 C)-99 F (37.2 C)] 98.2 F (36.8 C) (08/16 0600) Pulse Rate:  [60-73] 64  (08/16 0600) Resp:  [18-23] 18  (08/16 0600) BP: (102-118)/(40-62) 103/60 mmHg (08/16 0600) SpO2:  [94 %-100 %] 94 % (08/16 0847)  Intake/Output Summary (Last 24 hours) at 06/21/12 0902 Last data filed at 06/21/12 0700  Gross per 24 hour  Intake 2740.75 ml  Output   1725 ml  Net 1015.75 ml  30 % venturi mask      Physical Examination: General:  No distress Neuro:  Alert, follows commands HEENT:  No sinus tenderness Neck:  supple Cardiovascular:  S1, S2 regular Lungs:  Scattered rhonchi, no wheeze Abdomen:  Wound dressing clean, JP intact  Musculoskeletal:  No edema Skin:  Intact  ASSESSMENT AND PLAN  PULMONARY  ETT:  8/7 > 8/9 ETT 8/9 >>>8/12  8/05 CT chest>>diffuse atherosclerosis, diffuse centrilobular emphysema, basilar ATX, spinal DJD with mild scoliosis, 7 mm LUL nodule  A:  Acute hypoxemic respiratory failure>>likely from pulmonary edema in setting of probable COPD, and decreased abd compliance after laparotomy>>improved 8/15 Pulmonary nodule. P:   Continue scheduled  bronchodilators>>if she remains stable, then likely transition to inhalers soon Bronchial hygiene Mobilize as tolerated Keep even fluid balance F/u CXR as needed Will need outpt f/u for Lt upper lobe nodule Will need further outpt eval for probable COPD  CARDIOVASCULAR  Lines: R PICC 8/6 >>>  2 d echo 8/8: EF 55 to 60%, grade 1 diastolic dysfx  A: Shock>>resolved.   Likely acute on chronic diastolic dysfx with acute pulmonary edema>>improved.   Hx of HTN.   Diffuse atherosclerosis on CT chest. P:  Will likely need cardiology evaluation at some point for CAD  RENAL  Lab 06/21/12 0445 06/20/12 0430 06/19/12 0430 06/18/12 0545 06/17/12 0510  NA 138 140 140 141 140  K 3.3* 2.8* -- -- --  CL 109 109 110 111 110  CO2 19 23 22 23 24   BUN 37* 37* 35* 36* 38*  CREATININE 1.53* 1.52* 1.56* 1.68* 1.82*  CALCIUM 11.4* 11.9* 11.5* 11.7* 11.8*  MG 1.3* 1.1* -- -- 1.7  PHOS 2.2* 1.8* -- -- 3.7   Intake/Output      08/15 0701 - 08/16 0700 08/16 0701 - 08/17 0700   I.V. (mL/kg) 229.5 (3.8)    IV Piggyback 960    TPN 1721.3    Total Intake(mL/kg) 2910.8 (48.5)    Urine (mL/kg/hr) 1485 (1)    Emesis/NG output 300    Drains     Total Output 1785    Net +1125.8         Stool Occurrence 3 x     Foley:  8/5 >>>  A: Acute renal  failure with hx of CKD.  Baseline creatinine 1.85 (followed by Dr. Lowell Guitar as outpt). Hypokalemia, hypophosphatemia, hypomagnesemia. P: F/u renal fx Replace electrolytes per CCS Monitor urine outpt Goal even fluid balance KVO IV fluids  GASTROINTESTINAL  Lab 06/20/12 0430 06/17/12 0510 06/15/12 0514  AST 20 20 20   ALT 26 17 20   ALKPHOS 91 63 67  BILITOT 0.2* 0.3 0.3  PROT 5.5* 5.2* 5.3*  ALBUMIN 1.8* 1.7* 1.8*   A:  Perforated gastric ulcer s/p repair. P:   Post-op care per CCS TNA for nutrition until able to use gut>>defer timing to surgery Speech assessment 8/16>>Clear liquids Protonix q12h  HEMATOLOGIC  Lab 06/21/12 0445 06/20/12 0430  06/17/12 0510 06/15/12 0514  HGB 9.5* 9.7* 9.2* 10.0*  HCT 28.3* 29.1* 28.2* 30.4*  PLT 431* 387 234 167  INR -- -- -- --  APTT -- -- -- --   A:  Anemia of critical illness. P:  F/u CBC Transfuse for Hb < 7  INFECTIOUS  Lab 06/21/12 0445 06/20/12 0430 06/17/12 0510 06/15/12 0514  WBC 12.3* 13.7* 8.7 6.6  PROCALCITON -- -- 3.07 12.97   Cultures: 8/5  Blood x2>>negative 8/5 Urine >>negative 8/6 Urine >>negative 8/8 Respiratory>>oral flora  Antibiotics: Ertapenem 8/5>>8/5 8/5  Zosyn >>> 8/7  Vancomycin >>>8/9  A:  Peritonitis for perforated gastric ulcer. P:   Continue zosyn per GI>>Day 12/x>>can likely d/c if okay with CCS   ENDOCRINE  Lab 06/21/12 0757 06/21/12 0343 06/20/12 2355 06/20/12 1958 06/20/12 1633  GLUCAP 115* 119* 96 133* 152*   A:  DM type II with hyperglycemia.   History of hyperthyroidism, not on treatment. Hypercalcemia. P:   SSI May need input from renal or endocrine if calcium remains persistently elevated  NEUROLOGIC  A:  Acute encephalopathy (hypoxia, sedation)>>resolved.   Postop pain. P:   PRN fentanyl   BEST PRACTICE / DISPOSITION Level of Care:  Med-surg  Primary Service:  CCS Consultants:  PCCM, PT/OT, Speech Code Status:  Full Diet:  Clear liquid/ TNA DVT Px:  Heparin SQ GI Px:  Protonix bid Skin Integrity:  Intact Social / Family:  No family at bedside  Can be available as needed over weekend.  Will f/u on Monday, 8/19.  Please call if help needed sooner.  Coralyn Helling, MD [facility] Pulmonary/Critical Care 06/21/2012, 9:02 AM Pager:  [phone] After 3pm call: [phone]

## 2012-06-21 NOTE — Progress Notes (Signed)
Physical Therapy Treatment Patient Details Name: April [name] MRN: [id number] DOB: [birth year] Today's Date: 06/21/2012 Time: 1140-1200 PT Time Calculation (min): 20 min  PT Assessment / Plan / Recommendation Comments on Treatment Session  Upon arrive pt trying to crawl OOB.  Confused, pt satted "what is that snake doing over by the window". Pt required MAX cueing to correct orientation and assisted pt to EOB.  Pt still present with loose stools, so assisted to [facility].  Amb pt a limited distance + 3 assist.  Pt plans to D/C to SNF.    Follow Up Recommendations  Skilled nursing facility    Barriers to Discharge        Equipment Recommendations  Defer to next venue    Recommendations for Other Services    Frequency Min 3X/week   Plan Discharge plan remains appropriate    Precautions / Restrictions Precautions Precautions: Fall Precaution Comments: ABD incision and L drain Restrictions Weight Bearing Restrictions: No    Pertinent Vitals/Pain No c/o pain    Mobility  Bed Mobility Bed Mobility: Supine to Sit Supine to Sit: 3: Mod assist Details for Bed Mobility Assistance: 100% VC's for direction as pt demon confusion.  Increased tiema nd repeat cueing to stay on task.  Transfers Transfers: Sit to Stand;Stand to Sit Sit to Stand: 1: +2 Total assist;From toilet;From bed Sit to Stand: Patient Percentage: 30% Stand to Sit: 1: +2 Total assist;To toilet;To chair/3-in-1 Stand to Sit: Patient Percentage: 30% Details for Transfer Assistance: 100% VC's to increase participation and tactile cueing on hand placement.  Ambulation/Gait Ambulation/Gait Assistance: 1: +2 Total assist Ambulation/Gait: Patient Percentage: 40% Ambulation Distance (Feet): 15 Feet Assistive device: Rolling walker Ambulation/Gait Assistance Details: 100% VC's on direction and MAX facuilitation to avoid posterior lean and encourage gait distance.  NT assisted by following with chair. Gait Pattern: Step-to  pattern;Trunk flexed;Shuffle;Ataxic Gait velocity: decreased    PT Goals                      progressing   Visit Information  Last PT Received On: 06/21/12 Assistance Needed: +2 (+3 to amb)    Subjective Data  Subjective: Is that a snake over there? Patient Stated Goal: n/a   Cognition    confused to situation Oriented to self   Balance   poor  End of Session PT - End of Session Equipment Utilized During Treatment: Gait belt Activity Tolerance: Patient limited by fatigue Patient left: in chair;with call bell/phone within reach   Felecia Shelling  PTA [facility]  Acute  Rehab Pager     [phone]

## 2012-06-21 NOTE — Progress Notes (Signed)
MD, do you want to pt to be strictly NPO or NPO with ice chips?  Speech therapy recommended yesterday that she be NPO because she is still an aspiration risk as she has impaired swallowing.

## 2012-06-21 NOTE — Progress Notes (Signed)
CSW assisting with d/c planning. Pt has been faxed out to [facility]. Message left for pt's son Yannet Rincon [phone] ) contact CSW for assistance with d/c planning. Awaiting call back from son. CSW will provide bed offers when available.  Cori Razor LCSW [phone]

## 2012-06-22 ENCOUNTER — Encounter (HOSPITAL_COMMUNITY): Payer: Self-pay | Admitting: Surgery

## 2012-06-22 DIAGNOSIS — N179 Acute kidney failure, unspecified: Secondary | ICD-10-CM | POA: Clinically undetermined

## 2012-06-22 LAB — GLUCOSE, CAPILLARY
Glucose-Capillary: 125 mg/dL — ABNORMAL HIGH (ref 70–99)
Glucose-Capillary: 149 mg/dL — ABNORMAL HIGH (ref 70–99)

## 2012-06-22 LAB — BASIC METABOLIC PANEL
BUN: 39 mg/dL — ABNORMAL HIGH (ref 6–23)
Creatinine, Ser: 1.45 mg/dL — ABNORMAL HIGH (ref 0.50–1.10)
GFR calc Af Amer: 40 mL/min — ABNORMAL LOW (ref >90)
GFR calc non Af Amer: 35 mL/min — ABNORMAL LOW (ref >90)

## 2012-06-22 LAB — MAGNESIUM: Magnesium: 1.8 mg/dL (ref 1.5–2.5)

## 2012-06-22 MED ORDER — PANTOPRAZOLE SODIUM 40 MG PO TBEC
40.0000 mg | DELAYED_RELEASE_TABLET | Freq: Two times a day (BID) | ORAL | Status: DC
  Administered 2012-06-22 – 2012-06-24 (×4): 40 mg via ORAL
  Filled 2012-06-22 (×6): qty 1

## 2012-06-22 MED ORDER — ALUM & MAG HYDROXIDE-SIMETH 200-200-20 MG/5ML PO SUSP: 30.0000 mL | Freq: Four times a day (QID) | ORAL | Status: DC | PRN

## 2012-06-22 MED ORDER — LIP MEDEX EX OINT
1.0000 "application " | TOPICAL_OINTMENT | Freq: Two times a day (BID) | CUTANEOUS | Status: DC
  Administered 2012-06-22 – 2012-06-24 (×4): 1 via TOPICAL
  Filled 2012-06-22: qty 7

## 2012-06-22 MED ORDER — PSYLLIUM 95 % PO PACK
1.0000 | PACK | Freq: Two times a day (BID) | ORAL | Status: DC
  Administered 2012-06-22 – 2012-06-24 (×5): 1 via ORAL
  Filled 2012-06-22 (×6): qty 1

## 2012-06-22 MED ORDER — INSULIN REGULAR HUMAN 100 UNIT/ML IJ SOLN
INTRAVENOUS | Status: AC
  Administered 2012-06-22: 17:00:00 via INTRAVENOUS
  Filled 2012-06-22: qty 2000

## 2012-06-22 MED ORDER — ENSURE COMPLETE PO LIQD
237.0000 mL | Freq: Two times a day (BID) | ORAL | Status: DC
  Administered 2012-06-22 – 2012-06-24 (×5): 237 mL via ORAL

## 2012-06-22 MED ORDER — ACETAMINOPHEN 325 MG PO TABS
650.0000 mg | ORAL_TABLET | Freq: Four times a day (QID) | ORAL | Status: DC
  Administered 2012-06-22: 650 mg via ORAL
  Filled 2012-06-22: qty 2

## 2012-06-22 MED ORDER — ACETAMINOPHEN 325 MG PO TABS
650.0000 mg | ORAL_TABLET | Freq: Four times a day (QID) | ORAL | Status: DC
  Administered 2012-06-22 – 2012-06-24 (×8): 650 mg via ORAL
  Filled 2012-06-22 (×8): qty 2

## 2012-06-22 MED ORDER — OXYCODONE HCL 5 MG PO TABS: 2.5000 mg | ORAL_TABLET | Freq: Four times a day (QID) | ORAL | Status: DC | PRN

## 2012-06-22 MED ORDER — POTASSIUM PHOSPHATE DIBASIC 3 MMOLE/ML IV SOLN
15.0000 mmol | Freq: Once | INTRAVENOUS | Status: AC
  Administered 2012-06-22: 15 mmol via INTRAVENOUS
  Filled 2012-06-22: qty 5

## 2012-06-22 MED ORDER — MAGIC MOUTHWASH
15.0000 mL | Freq: Four times a day (QID) | ORAL | Status: DC | PRN
  Filled 2012-06-22: qty 15

## 2012-06-22 MED ORDER — SACCHAROMYCES BOULARDII 250 MG PO CAPS
250.0000 mg | ORAL_CAPSULE | Freq: Two times a day (BID) | ORAL | Status: DC
  Administered 2012-06-22 – 2012-06-24 (×5): 250 mg via ORAL
  Filled 2012-06-22 (×6): qty 1

## 2012-06-22 NOTE — Progress Notes (Signed)
Dr. Michaell Cowing spoke to patient family concerning confusion that has been present last several days

## 2012-06-22 NOTE — Progress Notes (Signed)
PARENTERAL NUTRITION CONSULT NOTE   Pharmacy Consult for TNA Indication: s/p gastric surgery, PCM.  No Known Allergies  Patient Measurements: Height: 5\' 2"  (157.5 cm) Weight: 132 lb 4.4 oz (60 kg) IBW/kg (Calculated) : 50.1   Vital Signs: Temp: 97.3 F (36.3 C) (08/17 0716) Temp src: Oral (08/17 0716) BP: 95/57 mmHg (08/17 0716) Pulse Rate: 72  (08/17 0716) Intake/Output from previous day: 08/16 0701 - 08/17 0700 In: 2274.3 [P.O.:80; I.V.:240.5; IV Piggyback:150; TPN:1803.8] Out: 1165 [Urine:1150; Drains:15] Intake/Output from this shift:    Labs:  [facility] 06/21/12 0445 06/20/12 0430  WBC 12.3* 13.7*  HGB 9.5* 9.7*  HCT 28.3* 29.1*  PLT 431* 387  APTT -- --  INR -- --    Basename 06/22/12 0430 06/21/12 0445 06/20/12 0430  NA 139 138 140  K 3.5 3.3* 2.8*  CL 112 109 109  CO2 19 19 23   GLUCOSE 112* 122* 150*  BUN 39* 37* 37*  CREATININE 1.45* 1.53* 1.52*  LABCREA -- -- --  CREAT24HRUR -- -- --  CALCIUM 11.4* 11.4* 11.9*  MG 1.8 1.3* 1.1*  PHOS 1.8* 2.2* 1.8*  PROT -- -- 5.5*  ALBUMIN -- -- 1.8*  AST -- -- 20  ALT -- -- 26  ALKPHOS -- -- 91  BILITOT -- -- 0.2*  BILIDIR -- -- --  IBILI -- -- --  PREALBUMIN -- -- --  TRIG -- -- --  CHOLHDL -- -- --  CHOL -- -- --  Corrected Calcium: 13.2 Estimated Creatinine Clearance: 26.9 ml/min (by C-G formula based on Cr of 1.45).    Medications:  Scheduled:     . ipratropium  0.5 mg Nebulization Q6H   And  . albuterol  2.5 mg Nebulization Q6H  . antiseptic oral rinse  15 mL Mouth Rinse QID  . chlorhexidine  15 mL Mouth Rinse BID  . heparin  5,000 Units Subcutaneous Q8H  . insulin aspart  0-15 Units Subcutaneous Q4H  . magnesium sulfate 1 - 4 g bolus IVPB  2 g Intravenous Once  . pantoprazole (PROTONIX) IV  40 mg Intravenous Q12H  . piperacillin-tazobactam (ZOSYN)  IV  3.375 g Intravenous Q8H  . potassium chloride  10 mEq Intravenous Q1 Hr x 4  . potassium phosphate IVPB (mmol)  15 mmol Intravenous Once    . sodium chloride  10-40 mL Intracatheter Q12H   Nutritional Goals:  per RD: 1250-1550Kcal/d and protein 69-82g/d. Clinimix 5/15 at goal rate 65 ml/hr + lipids at 23ml/hr on MWF provides:  78g/day protein and 1587 Kcal/day MWF, 1107 Kcal/day STTHS (Avg. 1312 Kcal/day weekly).  Current nutrition:  Clear liquids, NG out Climimix 5/15 @65ml /hr. Lipids 77ml/hr MWF MIVF: NS @ KVO  CBGs & Insulin requirements past 24 hours: CBG 113-144 (50 units insulin per 2L TNA bag) Moderate SSI q4h (8 units used)  Labs:   Electrolytes -   Calcium remains high despite using electrolyte-free TNA formula   Mag improved after supplement 8/16  Phos decreased despite supplement 8/16  K improved, low end of normal  SCr remains elevated but slightly improved (chronic renal insufficiency, baseline SCr unknown)  LFTs - wnl on 8/15  TG - wnl on 8/12  Prealbumin improving - 14.2 (8/12), 10.3 (8/8)  CBGs now controlled with insulin in TNA + sliding scale coverage  Assessment:  76yo F s/p repair of perforated prepyloric ulcer on 06/10/12. TNA started 8/7 d/t need for bowel rest.   Diet advanced & NGT d/c after passing swallow evaluation 8/16  Awaiting word on possible Panda placement   Plan:  Repeat K Phos 15 mM IV x 1  Continue with electrolyte-free formula (Clinimix 5/15) due to elevated Ca. Continue goal rate of 65 ml/hr.  Continue with insulin in TNA at 50 units per 2L bag (=25 units/L).   Patient receives 39 units/day with TNA rate at 36mL/hr.   Continue sliding scale insulin coverage also.  Continue Fat emulsion at 3ml/hr (MWF only due to ongoing shortage).   Multivitamins and trace elements (MWF only due to ongoing shortage).  Maintenance IVF per MD  BMet, Phos tomorrow.  F/U if diet tolerated, ability to wean/stop TNA  Loralee Pacas, PharmD, BCPS Pager: [phone] 06/22/2012 7:37 AM

## 2012-06-22 NOTE — Progress Notes (Signed)
SPOKE WITH PT'S SON SAMMY YESTERDAY.  HE IS CONCERNED THAT HIS MOTHER IS CONFUSED EVERY SINCE SHE LEFT  ICU YESTERDAY.  ACCORDING TO SON BEFORE PT CAME TO HOSPITAL SHE WAS ALERT AND ORIENTED AND HER MIND WAS ":SHARP".  HE EXPRESSED CONCERN THAT SHE MAY HAVE HAD  A STROKE OR SOME OTHER KIND OF BRAIN DAMAGE SINCE SHE HAS BEEN HERE.  HE NOTICED THAT WHEN SHE WAS EATING HER FORK WAS MISSING HER MOUTH.  IT WAS ALSO NOTICED THAT SHE HAD TROUBLE DRINKING FLUID BECAUSE HER MOUTH MISSED THE CUP.  SON WOULD LIKE MD TO CALL HIM AS SOON AS POSSIBLE.

## 2012-06-22 NOTE — Progress Notes (Addendum)
April [name] [id number] May 09, 1935   Subjective:  Confused at times but recognized family in room ## family today No events Not very hungry  Objective:  Vital signs:  Filed Vitals:   06/21/12 2055 06/22/12 0247 06/22/12 0716 06/22/12 0910  BP: 98/58  95/57   Pulse: 65  72   Temp: 97 F (36.1 C)  97.3 F (36.3 C)   TempSrc: Oral  Oral   Resp: 16  18   Height:      Weight:      SpO2: 93% 95% 91% 96%    Last BM Date: 06/21/12  Intake/Output   Yesterday:  08/16 0701 - 08/17 0700 In: 2274.3 [P.O.:80; I.V.:240.5; IV Piggyback:150; TPN:1803.8] Out: 1165 [Urine:1150; Drains:15] This shift:     Bowel function:  Flatus: y  BM: y  Physical Exam:  General: Pt awake/alert/oriented x2 in no acute distress Eyes: PERRL, normal EOM.  Sclera clear.  No icterus Neuro: CN II-XII intact w/o focal sensory/motor deficits. Lymph: No head/neck/groin lymphadenopathy Psych:  No delerium/psychosis/paranoia.  Cranky/fussy but consolable HENT: Normocephalic, Mucus membranes moist.  No thrush Neck: Supple, No tracheal deviation Chest: No chest wall pain w good excursion CV:  Pulses intact.  Regular rhythm Abdomen: Soft.  Nondistended.  Mildly tender at incisions only.  No incarcerated hernias.  Wound clean.  Drain out Ext:  SCDs BLE.  No mjr edema.  No cyanosis Skin: No petechiae / purpurae  Problem List:  Principal Problem:  *Perforated gastric ulcer Active Problems:  HTN (hypertension)  DM (diabetes mellitus)  Hypercalcemia  CRF (chronic renal failure)  Lung nodule  Hypomagnesemia  Leukocytosis  COPD (chronic obstructive pulmonary disease)  Acute respiratory failure with hypoxia  Acute encephalopathy  Aspiration pneumonia  ARF (acute renal failure)   Assessment  April [name]  77 y.o. female  12 Days Post-Op  Procedure(s): EXPLORATORY LAPAROTOMY Omental patch of perforated gastric ulcer  Slowly improving  Plan:  -adv diet.  Add supp shakes.  TNA until  eating better.  D/W speech therapy.  OK to adv to more solid diet these next few days per their protocol. -stop IV ABx -PPI for perforated ulcer.  Switch to PO -CKD stable, ARF resolving - follow.   -follow/correct lytes -follow MS - prob related to shock, ICU stay, etc.  Prob some ICU psychosis.  Sundowning precautions.  Appears to be improving.  No focal deficits -VTE prophylaxis- SCDs, etc -PT/OT/Rehab/mobilize as tolerated to help recovery  I discussed the patient's status to the family (daughters) x 15 minutes and then again another 15 minutes with the son & more family a little later.  Questions were answered.  They expressed understanding & appreciation.  Hopefully will continue to improve & transition to SNF this coming week   Ardeth Sportsman, M.D., F.A.C.S. Gastrointestinal and Minimally Invasive Surgery [facility] [address] [place], [place] [address] [phone] Main / Paging [phone] Voice Mail   06/22/2012  CARE TEAM:  PCP: No primary provider on file.  Outpatient Care Team: Patient has no care team.  Inpatient Treatment Team: Treatment Team: Attending Provider: Bishop Limbo, MD; Consulting Physician: Md Montez Morita, MD; Consulting Physician: Leroy Sea, MD; Rounding Team: Md Pccm, MD; Respiratory Therapist: Liz Beach, RRT; Registered Nurse: Bethann Goo, RN; Technician: Melvenia Beam, NT; Registered Nurse: Horton Marshall; Speech Language Pathologist: Riley Nearing Deblois, CCC-SLP   Results:   Labs: Results for orders placed during the hospital encounter of 06/10/12 (  from the past 48 hour(s))  GLUCOSE, CAPILLARY     Status: Abnormal   Collection Time   06/20/12 11:45 AM      Component Value Range Comment   Glucose-Capillary 163 (*) 70 - 99 mg/dL    Comment 1 Documented in Chart      Comment 2 Notify RN     GLUCOSE, CAPILLARY     Status: Abnormal   Collection Time   06/20/12  4:33 PM      Component Value  Range Comment   Glucose-Capillary 152 (*) 70 - 99 mg/dL   GLUCOSE, CAPILLARY     Status: Abnormal   Collection Time   06/20/12  7:58 PM      Component Value Range Comment   Glucose-Capillary 133 (*) 70 - 99 mg/dL    Comment 1 Notify RN     GLUCOSE, CAPILLARY     Status: Normal   Collection Time   06/20/12 11:55 PM      Component Value Range Comment   Glucose-Capillary 96  70 - 99 mg/dL   GLUCOSE, CAPILLARY     Status: Abnormal   Collection Time   06/21/12  3:43 AM      Component Value Range Comment   Glucose-Capillary 119 (*) 70 - 99 mg/dL   BASIC METABOLIC PANEL     Status: Abnormal   Collection Time   06/21/12  4:45 AM      Component Value Range Comment   Sodium 138  135 - 145 mEq/L    Potassium 3.3 (*) 3.5 - 5.1 mEq/L    Chloride 109  96 - 112 mEq/L    CO2 19  19 - 32 mEq/L    Glucose, Bld 122 (*) 70 - 99 mg/dL    BUN 37 (*) 6 - 23 mg/dL    Creatinine, Ser 0.45 (*) 0.50 - 1.10 mg/dL    Calcium 40.9 (*) 8.4 - 10.5 mg/dL    GFR calc non Af Amer 32 (*) >90 mL/min    GFR calc Af Amer 38 (*) >90 mL/min   CBC     Status: Abnormal   Collection Time   06/21/12  4:45 AM      Component Value Range Comment   WBC 12.3 (*) 4.0 - 10.5 K/uL    RBC 3.11 (*) 3.87 - 5.11 MIL/uL    Hemoglobin 9.5 (*) 12.0 - 15.0 g/dL    HCT 81.1 (*) 91.4 - 46.0 %    MCV 91.0  78.0 - 100.0 fL    MCH 30.5  26.0 - 34.0 pg    MCHC 33.6  30.0 - 36.0 g/dL    RDW 78.2  95.6 - 21.3 %    Platelets 431 (*) 150 - 400 K/uL   MAGNESIUM     Status: Abnormal   Collection Time   06/21/12  4:45 AM      Component Value Range Comment   Magnesium 1.3 (*) 1.5 - 2.5 mg/dL   PHOSPHORUS     Status: Abnormal   Collection Time   06/21/12  4:45 AM      Component Value Range Comment   Phosphorus 2.2 (*) 2.3 - 4.6 mg/dL   GLUCOSE, CAPILLARY     Status: Abnormal   Collection Time   06/21/12  7:57 AM      Component Value Range Comment   Glucose-Capillary 115 (*) 70 - 99 mg/dL   GLUCOSE, CAPILLARY     Status: Abnormal    Collection Time  06/21/12 12:20 PM      Component Value Range Comment   Glucose-Capillary 144 (*) 70 - 99 mg/dL   GLUCOSE, CAPILLARY     Status: Abnormal   Collection Time   06/21/12  4:21 PM      Component Value Range Comment   Glucose-Capillary 171 (*) 70 - 99 mg/dL   GLUCOSE, CAPILLARY     Status: Abnormal   Collection Time   06/21/12 11:52 PM      Component Value Range Comment   Glucose-Capillary 113 (*) 70 - 99 mg/dL    Comment 1 Notify RN      Comment 2 Documented in Chart     GLUCOSE, CAPILLARY     Status: Abnormal   Collection Time   06/22/12  3:59 AM      Component Value Range Comment   Glucose-Capillary 125 (*) 70 - 99 mg/dL    Comment 1 Notify RN      Comment 2 Documented in Chart     BASIC METABOLIC PANEL     Status: Abnormal   Collection Time   06/22/12  4:30 AM      Component Value Range Comment   Sodium 139  135 - 145 mEq/L    Potassium 3.5  3.5 - 5.1 mEq/L    Chloride 112  96 - 112 mEq/L    CO2 19  19 - 32 mEq/L    Glucose, Bld 112 (*) 70 - 99 mg/dL    BUN 39 (*) 6 - 23 mg/dL    Creatinine, Ser 1.61 (*) 0.50 - 1.10 mg/dL    Calcium 09.6 (*) 8.4 - 10.5 mg/dL    GFR calc non Af Amer 35 (*) >90 mL/min    GFR calc Af Amer 40 (*) >90 mL/min   MAGNESIUM     Status: Normal   Collection Time   06/22/12  4:30 AM      Component Value Range Comment   Magnesium 1.8  1.5 - 2.5 mg/dL   PHOSPHORUS     Status: Abnormal   Collection Time   06/22/12  4:30 AM      Component Value Range Comment   Phosphorus 1.8 (*) 2.3 - 4.6 mg/dL   GLUCOSE, CAPILLARY     Status: Abnormal   Collection Time   06/22/12  8:41 AM      Component Value Range Comment   Glucose-Capillary 108 (*) 70 - 99 mg/dL     Imaging / Studies: No results found.  Medications / Allergies: per chart  Antibiotics: Anti-infectives     Start     Dose/Rate Route Frequency Ordered Stop   06/13/12 2200   vancomycin (VANCOCIN) 500 mg in sodium chloride 0.9 % 100 mL IVPB  Status:  Discontinued        500  mg 100 mL/hr over 60 Minutes Intravenous Every 24 hours 06/12/12 2218 06/15/12 0840   06/12/12 2230   vancomycin (VANCOCIN) 750 mg in sodium chloride 0.9 % 150 mL IVPB        750 mg 150 mL/hr over 60 Minutes Intravenous  Once 06/12/12 2218 06/12/12 2330   06/11/12 1800  piperacillin-tazobactam (ZOSYN) IVPB 3.375 g       3.375 g 12.5 mL/hr over 240 Minutes Intravenous Every 8 hours 06/11/12 1343     06/11/12 0200   piperacillin-tazobactam (ZOSYN) IVPB 3.375 g  Status:  Discontinued        3.375 g 12.5 mL/hr over 240 Minutes Intravenous Every 8 hours 06/11/12  0133 06/11/12 1341   06/10/12 1945   ertapenem (INVANZ) 1 g in sodium chloride 0.9 % 50 mL IVPB        1 g 100 mL/hr over 30 Minutes Intravenous  Once 06/10/12 1930 06/10/12 2023

## 2012-06-22 NOTE — Progress Notes (Signed)
CSW provided list of bed offers for SNF facilities.   Weekday CSW to f/u for bed selection and further d/c planning.    Kemoni Ortega, LCSWA [facility] Weekend Coverage [phone]    

## 2012-06-22 NOTE — Progress Notes (Signed)
Speech Language Pathology Dysphagia Treatment Patient Details Name: April [name] MRN: [id number] DOB: [birth year] Today's Date: 06/22/2012 Time: 1120-1140 SLP Time Calculation (min): 20 min  Assessment / Plan / Recommendation Clinical Impression  No s/s of aspriation observed, pt feeding self. Observed to have small gasp for air after swallow indicating difficutly coordinating respiration and swallow. Pt demonstrated attempt  to masticate cracker, but pt realize dher bottom denture was not present. Family and pt report denture was present yesterday. Pt may initate sof Dysphagia 2 diet with thin liquids until missing denture found. Continue aspiration precuations. Family and pt educated.     Diet Recommendation  Initiate / Change Diet: Dysphagia 2 (fine chop);Thin liquid    SLP Plan Continue with current plan of care   Pertinent Vitals/Pain NA   Swallowing Goals  SLP Swallowing Goals Patient will consume recommended diet without observed clinical signs of aspiration with: Modified independent assistance Patient will utilize recommended strategies during swallow to increase swallowing safety with: Moderate assistance Swallow Study Goal #2 - Progress: Progressing toward goal  General Temperature Spikes Noted: No Respiratory Status: Supplemental O2 delivered via (comment) Behavior/Cognition: Alert;Cooperative;Hard of hearing;Requires cueing Oral Cavity - Dentition: Dentures, top (bottom dentures missing. RN aware)  Oral Cavity - Oral Hygiene Does patient have any of the following "at risk" factors?: Oxygen therapy - cannula, mask, simple oxygen devices Patient is AT RISK - Oral Care Protocol followed (see row info): Yes   Dysphagia Treatment Treatment focused on: Skilled observation of diet tolerance Patient observed directly with PO's: Yes Type of PO's observed: Dysphagia 1 (puree);Thin liquids Feeding: Able to feed self;Needs set up Liquids provided via: Cup;No straw Pharyngeal  Phase Signs & Symptoms: Other (comment);Changes in respirations (gasp for air after small swallow) Type of cueing: Verbal Amount of cueing: Minimal   GO     April [name], April [name] 06/22/2012, 11:45 AM

## 2012-06-23 LAB — BASIC METABOLIC PANEL
BUN: 45 mg/dL — ABNORMAL HIGH (ref 6–23)
GFR calc non Af Amer: 33 mL/min — ABNORMAL LOW (ref >90)
Glucose, Bld: 133 mg/dL — ABNORMAL HIGH (ref 70–99)
Potassium: 3.9 mEq/L (ref 3.5–5.1)

## 2012-06-23 LAB — GLUCOSE, CAPILLARY
Glucose-Capillary: 125 mg/dL — ABNORMAL HIGH (ref 70–99)
Glucose-Capillary: 128 mg/dL — ABNORMAL HIGH (ref 70–99)
Glucose-Capillary: 138 mg/dL — ABNORMAL HIGH (ref 70–99)
Glucose-Capillary: 193 mg/dL — ABNORMAL HIGH (ref 70–99)

## 2012-06-23 MED ORDER — INSULIN ASPART 100 UNIT/ML ~~LOC~~ SOLN
0.0000 [IU] | Freq: Three times a day (TID) | SUBCUTANEOUS | Status: DC
  Administered 2012-06-23: 3 [IU] via SUBCUTANEOUS
  Administered 2012-06-23 – 2012-06-24 (×5): 2 [IU] via SUBCUTANEOUS

## 2012-06-23 MED ORDER — CLINIMIX/DEXTROSE (5/15) 5 % IV SOLN
INTRAVENOUS | Status: DC
  Administered 2012-06-23: 18:00:00 via INTRAVENOUS
  Filled 2012-06-23: qty 1000

## 2012-06-23 MED ORDER — INSULIN ASPART 100 UNIT/ML ~~LOC~~ SOLN
0.0000 [IU] | Freq: Every day | SUBCUTANEOUS | Status: DC
Start: 2012-06-23 — End: 2012-06-24

## 2012-06-23 NOTE — Progress Notes (Signed)
April [name] [id number] 1935-06-11   Subjective:  Less confused  No events Eating more Denies pain Daughter in room  Objective:  Vital signs:  Filed Vitals:   06/22/12 1448 06/22/12 2049 06/22/12 2127 06/23/12 0455  BP: 94/48  92/52 96/54  Pulse: 66  68 68  Temp: 97.7 F (36.5 C)  97.8 F (36.6 C) 97.7 F (36.5 C)  TempSrc: Oral  Oral Oral  Resp: 18  20 18   Height:      Weight:      SpO2: 96% 97% 96% 95%    Last BM Date: 06/22/12  Intake/Output   Yesterday:  08/17 0701 - 08/18 0700 In: 2631.3 [P.O.:720; I.V.:239; VHQ:4696.2] Out: 1300 [Urine:1300] This shift:     Bowel function:  Flatus: y  BM: x1 yest  Physical Exam:  General: Pt awake/alert/oriented x4 in no acute distress Eyes: PERRL, normal EOM.  Sclera clear.  No icterus Neuro: CN II-XII intact w/o focal sensory/motor deficits. Lymph: No head/neck/groin lymphadenopathy Psych:  No delerium/psychosis/paranoia.  Smiling, not cranky/agitated HENT: Normocephalic, Mucus membranes moist.  No thrush Neck: Supple, No tracheal deviation Chest: No chest wall pain w good excursion CV:  Pulses intact.  Regular rhythm Abdomen: Soft.  Nondistended.  Nontender at incision.  No incarcerated hernias.  Wound clean with fair granulation.  Drain out Ext:  SCDs BLE.  No mjr edema.  No cyanosis Skin: No petechiae / purpurae  Problem List:  Principal Problem:  *Perforated gastric ulcer Active Problems:  HTN (hypertension)  DM (diabetes mellitus)  Hypercalcemia  CRF (chronic renal failure)  Lung nodule  Hypomagnesemia  Leukocytosis  COPD (chronic obstructive pulmonary disease)  Acute respiratory failure with hypoxia  Acute encephalopathy  Aspiration pneumonia  ARF (acute renal failure)   Assessment  April [name]  78 y.o. female  13 Days Post-Op  Procedure(s): EXPLORATORY LAPAROTOMY Omental patch of perforated gastric ulcer  Improving  Plan:  -adv diet.  Add supp shakes.  D/W speech therapy.  OK  to adv to more solid diet these next few days per their protocol. -wean TNA if cal counts good -stable off IV ABx -PPI for perforated ulcer.   -CKD stable, ARF resolving - follow.   -follow/correct lytes -MS improving - follow.  Pob delerium related to shock, ICU stay, etc. Sundowning precautions.  -VTE prophylaxis- SCDs, etc -PT/OT/Rehab/mobilize as tolerated to help recovery  I discussed the patient's status to the family x 10 minutes.  Questions were answered.  They expressed understanding & appreciation.  Hopefully will transition to SNF in 1-3 days if makes improvements   Ardeth Sportsman, M.D., F.A.C.S. Gastrointestinal and Minimally Invasive Surgery [facility] [address] [place], [place] [address] [phone] Main / Paging [phone] Voice Mail   06/23/2012  CARE TEAM:  PCP: No primary provider on file.  Outpatient Care Team: Patient Care Team: Lauris Poag, MD as Consulting Physician (Nephrology)  Inpatient Treatment Team: Treatment Team: Attending Provider: Bishop Limbo, MD; Consulting Physician: Bishop Limbo, MD; Consulting Physician: Leroy Sea, MD; Rounding Team: Md Pccm, MD; Respiratory Therapist: Liz Beach, RRT; Registered Nurse: Bethann Goo, RN; Technician: Melvenia Beam, NT; Registered Nurse: Horton Marshall; Speech Language Pathologist: Riley Nearing Deblois, CCC-SLP; Registered Nurse: Sydell Axon, RN; Respiratory Therapist: Toula Moos, RRT   Results:   Labs: Results for orders placed during the hospital encounter of 06/10/12 (from the past 48 hour(s))  GLUCOSE, CAPILLARY     Status:  Abnormal   Collection Time   06/21/12 12:20 PM      Component Value Range Comment   Glucose-Capillary 144 (*) 70 - 99 mg/dL   GLUCOSE, CAPILLARY     Status: Abnormal   Collection Time   06/21/12  4:21 PM      Component Value Range Comment   Glucose-Capillary 171 (*) 70 - 99 mg/dL   GLUCOSE,  CAPILLARY     Status: Abnormal   Collection Time   06/21/12 11:52 PM      Component Value Range Comment   Glucose-Capillary 113 (*) 70 - 99 mg/dL    Comment 1 Notify RN      Comment 2 Documented in Chart     GLUCOSE, CAPILLARY     Status: Abnormal   Collection Time   06/22/12  3:59 AM      Component Value Range Comment   Glucose-Capillary 125 (*) 70 - 99 mg/dL    Comment 1 Notify RN      Comment 2 Documented in Chart     BASIC METABOLIC PANEL     Status: Abnormal   Collection Time   06/22/12  4:30 AM      Component Value Range Comment   Sodium 139  135 - 145 mEq/L    Potassium 3.5  3.5 - 5.1 mEq/L    Chloride 112  96 - 112 mEq/L    CO2 19  19 - 32 mEq/L    Glucose, Bld 112 (*) 70 - 99 mg/dL    BUN 39 (*) 6 - 23 mg/dL    Creatinine, Ser 1.61 (*) 0.50 - 1.10 mg/dL    Calcium 09.6 (*) 8.4 - 10.5 mg/dL    GFR calc non Af Amer 35 (*) >90 mL/min    GFR calc Af Amer 40 (*) >90 mL/min   MAGNESIUM     Status: Normal   Collection Time   06/22/12  4:30 AM      Component Value Range Comment   Magnesium 1.8  1.5 - 2.5 mg/dL   PHOSPHORUS     Status: Abnormal   Collection Time   06/22/12  4:30 AM      Component Value Range Comment   Phosphorus 1.8 (*) 2.3 - 4.6 mg/dL   GLUCOSE, CAPILLARY     Status: Abnormal   Collection Time   06/22/12  8:41 AM      Component Value Range Comment   Glucose-Capillary 108 (*) 70 - 99 mg/dL   GLUCOSE, CAPILLARY     Status: Abnormal   Collection Time   06/22/12 11:41 AM      Component Value Range Comment   Glucose-Capillary 149 (*) 70 - 99 mg/dL   GLUCOSE, CAPILLARY     Status: Abnormal   Collection Time   06/22/12  4:23 PM      Component Value Range Comment   Glucose-Capillary 136 (*) 70 - 99 mg/dL   GLUCOSE, CAPILLARY     Status: Abnormal   Collection Time   06/22/12  7:44 PM      Component Value Range Comment   Glucose-Capillary 146 (*) 70 - 99 mg/dL    Comment 1 Notify RN      Comment 2 Documented in Chart     GLUCOSE, CAPILLARY     Status:  Abnormal   Collection Time   06/23/12 12:11 AM      Component Value Range Comment   Glucose-Capillary 128 (*) 70 - 99 mg/dL   GLUCOSE, CAPILLARY  Status: Abnormal   Collection Time   06/23/12  4:09 AM      Component Value Range Comment   Glucose-Capillary 125 (*) 70 - 99 mg/dL   BASIC METABOLIC PANEL     Status: Abnormal   Collection Time   06/23/12  4:48 AM      Component Value Range Comment   Sodium 138  135 - 145 mEq/L    Potassium 3.9  3.5 - 5.1 mEq/L    Chloride 114 (*) 96 - 112 mEq/L    CO2 18 (*) 19 - 32 mEq/L    Glucose, Bld 133 (*) 70 - 99 mg/dL    BUN 45 (*) 6 - 23 mg/dL    Creatinine, Ser 8.65 (*) 0.50 - 1.10 mg/dL    Calcium 78.4 (*) 8.4 - 10.5 mg/dL    GFR calc non Af Amer 33 (*) >90 mL/min    GFR calc Af Amer 38 (*) >90 mL/min   PHOSPHORUS     Status: Normal   Collection Time   06/23/12  4:48 AM      Component Value Range Comment   Phosphorus 2.7  2.3 - 4.6 mg/dL   GLUCOSE, CAPILLARY     Status: Abnormal   Collection Time   06/23/12  8:39 AM      Component Value Range Comment   Glucose-Capillary 138 (*) 70 - 99 mg/dL     Imaging / Studies: No results found.  Medications / Allergies: per chart  Antibiotics: Anti-infectives     Start     Dose/Rate Route Frequency Ordered Stop   06/13/12 2200   vancomycin (VANCOCIN) 500 mg in sodium chloride 0.9 % 100 mL IVPB  Status:  Discontinued        500 mg 100 mL/hr over 60 Minutes Intravenous Every 24 hours 06/12/12 2218 06/15/12 0840   06/12/12 2230   vancomycin (VANCOCIN) 750 mg in sodium chloride 0.9 % 150 mL IVPB        750 mg 150 mL/hr over 60 Minutes Intravenous  Once 06/12/12 2218 06/12/12 2330   06/11/12 1800   piperacillin-tazobactam (ZOSYN) IVPB 3.375 g  Status:  Discontinued        3.375 g 12.5 mL/hr over 240 Minutes Intravenous Every 8 hours 06/11/12 1343 06/22/12 1039   06/11/12 0200   piperacillin-tazobactam (ZOSYN) IVPB 3.375 g  Status:  Discontinued        3.375 g 12.5 mL/hr over 240 Minutes  Intravenous Every 8 hours 06/11/12 0133 06/11/12 1341   06/10/12 1945   ertapenem (INVANZ) 1 g in sodium chloride 0.9 % 50 mL IVPB        1 g 100 mL/hr over 30 Minutes Intravenous  Once 06/10/12 1930 06/10/12 2023

## 2012-06-23 NOTE — Progress Notes (Signed)
PARENTERAL NUTRITION CONSULT NOTE   Pharmacy Consult for TNA Indication: s/p gastric surgery, PCM.  Allergies  Allergen Reactions  . Nsaids     Pt had perforated ulcer - patched [id number]    Patient Measurements: Height: 5\' 2"  (157.5 cm) Weight: 132 lb 4.4 oz (60 kg) IBW/kg (Calculated) : 50.1   Vital Signs: Temp: 97.7 F (36.5 C) (08/18 0455) Temp src: Oral (08/18 0455) BP: 96/54 mmHg (08/18 0455) Pulse Rate: 68  (08/18 0455) Intake/Output from previous day: 08/17 0701 - 08/18 0700 In: 2631.3 [P.O.:720; I.V.:239; AVW:0981.1] Out: 1300 [Urine:1300] Intake/Output from this shift:    Labs:  [facility] 06/21/12 0445  WBC 12.3*  HGB 9.5*  HCT 28.3*  PLT 431*  APTT --  INR --    Basename 06/23/12 0448 06/22/12 0430 06/21/12 0445  NA 138 139 138  K 3.9 3.5 3.3*  CL 114* 112 109  CO2 18* 19 19  GLUCOSE 133* 112* 122*  BUN 45* 39* 37*  CREATININE 1.50* 1.45* 1.53*  LABCREA -- -- --  CREAT24HRUR -- -- --  CALCIUM 11.7* 11.4* 11.4*  MG -- 1.8 1.3*  PHOS 2.7 1.8* 2.2*  PROT -- -- --  ALBUMIN -- -- --  AST -- -- --  ALT -- -- --  ALKPHOS -- -- --  BILITOT -- -- --  BILIDIR -- -- --  IBILI -- -- --  PREALBUMIN -- -- --  TRIG -- -- --  CHOLHDL -- -- --  CHOL -- -- --  Corrected Calcium: 13.2 Estimated Creatinine Clearance: 26 ml/min (by C-G formula based on Cr of 1.5).    Medications:  Scheduled:     . acetaminophen  650 mg Oral Q6H  . ipratropium  0.5 mg Nebulization Q6H   And  . albuterol  2.5 mg Nebulization Q6H  . antiseptic oral rinse  15 mL Mouth Rinse QID  . feeding supplement  237 mL Oral BID BM  . heparin  5,000 Units Subcutaneous Q8H  . insulin aspart  0-15 Units Subcutaneous Q4H  . lip balm  1 application Topical BID  . pantoprazole  40 mg Oral BID AC  . potassium phosphate IVPB (mmol)  15 mmol Intravenous Once  . psyllium  1 packet Oral BID  . saccharomyces boulardii  250 mg Oral BID  . sodium chloride  10-40 mL Intracatheter Q12H  .  DISCONTD: acetaminophen  650 mg Oral QID  . DISCONTD: chlorhexidine  15 mL Mouth Rinse BID  . DISCONTD: pantoprazole (PROTONIX) IV  40 mg Intravenous Q12H  . DISCONTD: piperacillin-tazobactam (ZOSYN)  IV  3.375 g Intravenous Q8H   Nutritional Goals:  per RD: 1250-1550Kcal/d and protein 69-82g/d. Clinimix 5/15 at goal rate 65 ml/hr + lipids at 22ml/hr on MWF provides:  78g/day protein and 1587 Kcal/day MWF, 1107 Kcal/day STTHS (Avg. 1312 Kcal/day weekly).  Current nutrition:  Advanced dysphagia 2 (8/17) Climimix 5/15 @65ml /hr. Lipids 76ml/hr MWF MIVF: NS @ KVO  CBGs & Insulin requirements past 24 hours: CBG 108-149 (50 units insulin per 2L TNA bag) Moderate SSI q4h (10 units used)  Labs:   Electrolytes -   Calcium remains high despite using electrolyte-free TNA formula   Mag improved after supplement 8/16  Phos now WNL after replacement with [facility] 8/15-8/17  K improved after [facility] 8/15-8/17  SCr remains elevated but slightly improved (chronic renal insufficiency, baseline SCr unknown)  LFTs - wnl on 8/15  TG - wnl on 8/12  Prealbumin improving - 14.2 (8/12), 10.3 (8/8)  CBGs now controlled with insulin in TNA + sliding scale coverage, fluctuating some with diet advancement  Assessment:  [name] s/p repair of perforated prepyloric ulcer on 06/10/12. TNA started 8/7 d/t need for bowel rest.   Diet advanced & NGT d/c after passing swallow evaluation 8/16  Appears to be tolerating some solid food  Plan:  Continue with electrolyte-free formula (Clinimix 5/15) due to elevated Ca. Decrease rate to 40 ml/hr.  Maintain ~40 units insulin/day by adjusting insulin in TNA to 40 units/L   Change CBG/SSI to ACHS  Continue Fat emulsion at 9ml/hr (MWF only due to ongoing shortage).   Multivitamins and trace elements (MWF only due to ongoing shortage).  Maintenance IVF per MD  TNA lab panel Mon, Thurs  F/U if diet tolerated, ability to stop TNA  Loralee Pacas, PharmD,  BCPS Pager: [phone] 06/23/2012 7:17 AM

## 2012-06-24 LAB — DIFFERENTIAL
Basophils Relative: 2 % — ABNORMAL HIGH (ref 0–1)
Eosinophils Absolute: 0.2 10*3/uL (ref 0.0–0.7)
Lymphocytes Relative: 20 % (ref 12–46)
Lymphs Abs: 2 10*3/uL (ref 0.7–4.0)
Monocytes Absolute: 0.9 10*3/uL (ref 0.1–1.0)
Neutro Abs: 6.5 10*3/uL (ref 1.7–7.7)

## 2012-06-24 LAB — GLUCOSE, CAPILLARY: Glucose-Capillary: 135 mg/dL — ABNORMAL HIGH (ref 70–99)

## 2012-06-24 LAB — CBC
HCT: 27.3 % — ABNORMAL LOW (ref 36.0–46.0)
Hemoglobin: 8.9 g/dL — ABNORMAL LOW (ref 12.0–15.0)
MCH: 29.8 pg (ref 26.0–34.0)
MCHC: 32.6 g/dL (ref 30.0–36.0)
MCV: 91.3 fL (ref 78.0–100.0)
WBC: 9.8 10*3/uL (ref 4.0–10.5)

## 2012-06-24 LAB — COMPREHENSIVE METABOLIC PANEL
ALT: 26 U/L (ref 0–35)
BUN: 44 mg/dL — ABNORMAL HIGH (ref 6–23)
Calcium: 12.4 mg/dL — ABNORMAL HIGH (ref 8.4–10.5)
Creatinine, Ser: 1.48 mg/dL — ABNORMAL HIGH (ref 0.50–1.10)
GFR calc Af Amer: 39 mL/min — ABNORMAL LOW (ref >90)
Glucose, Bld: 120 mg/dL — ABNORMAL HIGH (ref 70–99)
Sodium: 138 mEq/L (ref 135–145)
Total Protein: 5.5 g/dL — ABNORMAL LOW (ref 6.0–8.3)

## 2012-06-24 LAB — MAGNESIUM: Magnesium: 1.5 mg/dL (ref 1.5–2.5)

## 2012-06-24 LAB — PHOSPHORUS: Phosphorus: 2.4 mg/dL (ref 2.3–4.6)

## 2012-06-24 LAB — CHOLESTEROL, TOTAL: Cholesterol: 97 mg/dL (ref 0–200)

## 2012-06-24 MED ORDER — PANTOPRAZOLE SODIUM 40 MG PO TBEC: 40.0000 mg | DELAYED_RELEASE_TABLET | Freq: Two times a day (BID) | ORAL | Status: DC

## 2012-06-24 MED ORDER — OXYCODONE HCL 5 MG PO TABS: 2.5000 mg | ORAL_TABLET | Freq: Four times a day (QID) | ORAL | Status: AC | PRN

## 2012-06-24 NOTE — Discharge Summary (Signed)
Patient ID: April [name] MRN: [id number] DOB/AGE: 05-31-1935 77 y.o.  Admit date: 06/10/2012 Discharge date: 06/24/2012  Procedures: exploratory laparotomy with repair of gastric ulcer perforation  Consults: pulmonary/intensive care  Reason for Admission: This is a 77 yo female who had about 2 weeks of abdominal pain prior to admission.  She coughed and developed severe sharp epigastric pain.  She came to the ED and was found to have pneumoperitoneum.    Admission Diagnoses:  1. Pneumoperitoneum Patient Active Problem List  Diagnosis  . HTN (hypertension)  . Hyperlipidemia  . Benign tumor of pituitary gland  . Polyp of colon  . Hyperparathyroidism, primary  . DM (diabetes mellitus)  . Hypercalcemia  . CRF (chronic renal failure)  . Perforated gastric ulcer  .   .   .   .   .   .      Marland Kitchen      Hospital Course: the patient was admitted and taken to the operating room for surgical repair of a perforated gastric ulcer.  She had an NGT placed postoperatively.  On POD# 2, then patient developed pulmonary edema and had to be intubated.  She was weaned and extubated a couple days later, but failed and had to be reintubated.  She was ultimately extubated again on POD# 7.  She underwent a swallow study through her NGT around this time.  There was no evidence of a leak and this was removed.  She had a swallow study and passed.  She was then started on clear liquid diet and was advanced as tolerated.  Her wound is open and has been being packed with NS WD dressings BID.  This is clean.    She never had her H. Pylori stool sample sent.  I have ordered a blood H. Pylori prior to discharge and will follow up on that.  If it is positive, we can call the facility to place her on the correct medications.  The patient also had some deconditioning and SNF was recommended at discharge.  PT/OT worked with her throughout her admission.  Given her pulmonary problems during admission, it was felt by  CCM that the patient likely has COPD and has recommended follow up with them.  She was also found to have a lung nodule and will follow up with them about this as well.    She was noted to have hypercalcemia.  The patient has hyperthyroidism and hyperparathyroidism.  It is likely secondary to this.  She states she sees Dr. Lorenso Courier who has her on medication for this; however, I do not see a medication in her [facility] for this.  I have informed her she will need to follow up for further treatment.  She was also informed, per CCM recs, that she will need cardiology follow up as well for her CAD.  D/w pt and her daughter who was present.   Discharge Diagnoses:  Principal Problem:  *Perforated gastric ulcer Active Problems:  HTN (hypertension)  DM (diabetes mellitus)  Hypercalcemia  CRF (chronic renal failure)  Lung nodule  Hypomagnesemia  Leukocytosis  COPD (chronic obstructive pulmonary disease)  Acute respiratory failure with hypoxia  Acute encephalopathy  Aspiration pneumonia  ARF (acute renal failure) s/p ex lap with repair of perforated gastric ulcer  Discharge Medications: Medication List  As of 06/24/2012  3:18 PM   TAKE these medications         aspirin 81 MG tablet   Take 81 mg by mouth daily.  atorvastatin 20 MG tablet   Commonly known as: LIPITOR   Take 20 mg by mouth daily.      oxyCODONE 5 MG immediate release tablet   Commonly known as: Oxy IR/ROXICODONE   Take 0.5-2 tablets (2.5-10 mg total) by mouth every 6 (six) hours as needed.      pantoprazole 40 MG tablet   Commonly known as: PROTONIX   Take 1 tablet (40 mg total) by mouth 2 (two) times daily before a meal.      sitaGLIPtan-metformin 50-500 MG per tablet   Commonly known as: JANUMET   Take 1 tablet by mouth 2 (two) times daily with a meal.            Discharge Instructions: Follow-up Information    Follow up with Adolph Pollack, MD.   Contact information:   [address] [place] [address] [phone]       Follow up with Dr. Caralee Ates. Schedule an appointment as soon as possible for a visit in 2 weeks.   You will need to follow up with Dr. Lorenso Courier of endocrinology  Please discuss with primary doctor about follow up with cardiology   Follow up with [facility] for lung nodule and COPD  -normal saline wet to dry dressing changes to abdominal wound BID -may have a regular carb mod diet.   Signed: Letha Cape 06/24/2012, 3:18 PM

## 2012-06-24 NOTE — Progress Notes (Signed)
Patient is with stable vital signs, no compliants of pain, tolerating diet, incision within normal limits, report given to nurse at rehab, will continue to monitor Means, Myrtie Hawk RN 06-24-2012 17:35pm

## 2012-06-24 NOTE — Progress Notes (Signed)
Name: April [name] MRN: [id number] DOB: [birth year]    LOS: 14  Referring Provider:  CCS Reason for Referral:  Acute respiratory failure  PULMONARY / CRITICAL CARE MEDICINE   Brief patient description:  77 yo female smoker admitted 06/10/2012 abdominal pain and pneumoperitoneum from perforated pre-pyloric gastric ulcer.  Had emergent laparotomy with repair of gastric ulcer and omental patch on 8/05.  She developed acute hypoxic respiratory failure 8/07 and PCCM consulted. PMHx of CKD, DM, Hyperparathyroidism, HTN   Current Status: No issues overnight.  Denies chest pain.  Breathing okay.  Vital Signs: Temp:  [98 F (36.7 C)-98.3 F (36.8 C)] 98 F (36.7 C) (08/19 0444) Pulse Rate:  [63-71] 63  (08/19 0444) Resp:  [16-18] 16  (08/19 0444) BP: (95-101)/(52-57) 100/57 mmHg (08/19 0444) SpO2:  [92 %-97 %] 94 % (08/19 1005) FIO2 2lpm  Intake/Output Summary (Last 24 hours) at 06/24/12 1218 Last data filed at 06/24/12 0825  Gross per 24 hour  Intake 1387.59 ml  Output   1950 ml  Net -562.41 ml     Physical Examination: General:  No distress Neuro:  Alert, follows commands,hoh HEENT:  No sinus tenderness Neck:  supple Cardiovascular:  S1, S2 regular Lungs:  Scattered rhonchi, no wheeze Abdomen:  Wound dressing clean,  Musculoskeletal:  No edema Skin:  Intact  ASSESSMENT AND PLAN  PULMONARY  ETT:  8/7 > 8/9 ETT 8/9 >>>8/12  8/05 CT chest>>diffuse atherosclerosis, diffuse centrilobular emphysema, basilar ATX, spinal DJD with mild scoliosis, 7 mm LUL nodule  A:  Acute hypoxemic respiratory failure>>likely from pulmonary edema in setting of probable COPD, and decreased abd compliance after laparotomy>>improved 8/15.  - still 02 dep at 2lpm  Pulmonary nodule. P:   Continue scheduled bronchodilators  Bronchial hygiene Mobilize as tolerated Keep even fluid balance F/u CXR as needed Will need outpt f/u for Lt upper lobe nodule. Will arrange as outpatient (no shape for  anything but conservative f/u so placed in tickle file for recall at 3 months Will need further outpt eval for probable COPD at discharge from reahab  CARDIOVASCULAR  Lines: R PICC 8/6 >>>  2 d echo 8/8: EF 55 to 60%, grade 1 diastolic dysfx  A: Shock>>resolved.   Likely acute on chronic diastolic dysfx with acute pulmonary edema>>improved.   Hx of HTN.   Diffuse atherosclerosis on CT chest. P:  Will likely need cardiology evaluation at some point for CAD  RENAL  Lab 06/24/12 0440 06/23/12 0448 06/22/12 0430 06/21/12 0445 06/20/12 0430  NA 138 138 139 138 140  K 4.0 3.9 -- -- --  CL 114* 114* 112 109 109  CO2 19 18* 19 19 23   BUN 44* 45* 39* 37* 37*  CREATININE 1.48* 1.50* 1.45* 1.53* 1.52*  CALCIUM 12.4* 11.7* 11.4* 11.4* 11.9*  MG 1.5 -- 1.8 1.3* 1.1*  PHOS 2.4 2.7 1.8* 2.2* 1.8*   Intake/Output      08/18 0701 - 08/19 0700 08/19 0701 - 08/20 0700   P.O. 120    I.V. (mL/kg) 241 (4)    TPN 1498.1    Total Intake(mL/kg) 1859.1 (31)    Urine (mL/kg/hr) 1700 (1.2) 250 (0.8)   Total Output 1700 250   Net +159.1 -250        Urine Occurrence 1 x       A: Acute renal failure with hx of CKD.  Baseline creatinine 1.85 (followed by Dr. Lowell Guitar as outpt). Hypokalemia, hypophosphatemia, hypomagnesemia. Lab Results  Component Value Date  CREATININE 1.48* 06/24/2012   CREATININE 1.50* 06/23/2012   CREATININE 1.45* 06/22/2012    P: F/u renal fx Replace electrolytes per CCS Monitor urine outpt Goal even fluid balance KVO IV fluids  GASTROINTESTINAL  Lab 06/24/12 0440 06/20/12 0430  AST 19 20  ALT 26 26  ALKPHOS 71 91  BILITOT 0.2* 0.2*  PROT 5.5* 5.5*  ALBUMIN 1.9* 1.8*   A:  Perforated gastric ulcer s/p repair. P:   Post-op care per CCS TNA for nutrition until able to use gut>>defer timing to surgery Speech assessment 8/16>>Clear liquids Protonix q12h  HEMATOLOGIC  Lab 06/24/12 0440 06/21/12 0445 06/20/12 0430  HGB 8.9* 9.5* 9.7*  HCT 27.3* 28.3* 29.1*    PLT 446* 431* 387  INR -- -- --  APTT -- -- --   A:  Anemia of critical illness. P:  F/u CBC Transfuse for Hb < 7  INFECTIOUS  Lab 06/24/12 0440 06/21/12 0445 06/20/12 0430  WBC 9.8 12.3* 13.7*  PROCALCITON -- -- --   Cultures: 8/5  Blood x2>>negative 8/5 Urine >>negative 8/6 Urine >>negative 8/8 Respiratory>>oral flora  Antibiotics: Ertapenem 8/5>>8/5 8/5  Zosyn >>>dc 8/7  Vancomycin >>>8/9  A:  Peritonitis for perforated gastric ulcer. P:    zosyn per GI>>dc'd   ENDOCRINE  Lab 06/24/12 1044 06/23/12 2114 06/23/12 1650 06/23/12 1233 06/23/12 0839  GLUCAP 124* 193* 152* 141* 138*   Lab Results  Component Value Date   CALCIUM 12.4* 06/24/2012   PHOS 2.4 06/24/2012    A:  DM type II with hyperglycemia.   History of hyperthyroidism, not on treatment. Hypercalcemia. P:   SSI May need input from renal or endocrine if calcium remains persistently elevated  NEUROLOGIC  A:  Acute encephalopathy (hypoxia, sedation)>>resolved.   Postop pain. P:   PRN fentanyl   BEST PRACTICE / DISPOSITION Level of Care:  Med-surg  Primary Service:  CCS Consultants:  PCCM, PT/OT, Speech Code Status:  Full Diet:  Clear liquid/ TNA DVT Px:  Heparin SQ GI Px:  Protonix bid Skin Integrity:  Intact Social / Family:  Son at bedside, informed of need for pulmonary f/u p discharge from SNF and need to f/u nodule  Brett Canales Minor ACNP Adolph Pollack PCCM Pager [phone] till 3 pm If no answer page [phone] 06/24/2012, 12:18 PM  Pt independently  seen and examined and available cxr's reviewed and I agree with above findings/ imp/ plan  Sandrea Hughs, MD Pulmonary and Critical Care Medicine [facility] Cell [phone]

## 2012-06-24 NOTE — Progress Notes (Signed)
PARENTERAL NUTRITION CONSULT NOTE   Pharmacy Consult for TNA Indication: s/p gastric surgery, PCM.  Allergies  Allergen Reactions  . Nsaids     Pt had perforated ulcer - patched [id number]    Patient Measurements: Height: 5\' 2"  (157.5 cm) Weight: 132 lb 4.4 oz (60 kg) IBW/kg (Calculated) : 50.1   Vital Signs: Temp: 98 F (36.7 C) (08/19 0444) Temp src: Oral (08/19 0444) BP: 100/57 mmHg (08/19 0444) Pulse Rate: 63  (08/19 0444) Intake/Output from previous day: 08/18 0701 - 08/19 0700 In: 1859.1 [P.O.:120; I.V.:241; TPN:1498.1] Out: 1700 [Urine:1700] Intake/Output from this shift: Total I/O In: -  Out: 250 [Urine:250]  Labs:  [facility] 06/24/12 0440  WBC 9.8  HGB 8.9*  HCT 27.3*  PLT 446*  APTT --  INR --    Basename 06/24/12 0440 06/23/12 0448 06/22/12 0430  NA 138 138 139  K 4.0 3.9 3.5  CL 114* 114* 112  CO2 19 18* 19  GLUCOSE 120* 133* 112*  BUN 44* 45* 39*  CREATININE 1.48* 1.50* 1.45*  LABCREA -- -- --  CREAT24HRUR -- -- --  CALCIUM 12.4* 11.7* 11.4*  MG 1.5 -- 1.8  PHOS 2.4 2.7 1.8*  PROT 5.5* -- --  ALBUMIN 1.9* -- --  AST 19 -- --  ALT 26 -- --  ALKPHOS 71 -- --  BILITOT 0.2* -- --  BILIDIR -- -- --  IBILI -- -- --  PREALBUMIN -- -- --  TRIG 86 -- --  CHOLHDL -- -- --  CHOL 97 -- --  Corrected Calcium: 13.2 Estimated Creatinine Clearance: 26.4 ml/min (by C-G formula based on Cr of 1.48).    Medications:  Scheduled:     . acetaminophen  650 mg Oral Q6H  . ipratropium  0.5 mg Nebulization Q6H   And  . albuterol  2.5 mg Nebulization Q6H  . antiseptic oral rinse  15 mL Mouth Rinse QID  . feeding supplement  237 mL Oral BID BM  . heparin  5,000 Units Subcutaneous Q8H  . insulin aspart  0-15 Units Subcutaneous TID WC  . insulin aspart  0-5 Units Subcutaneous QHS  . lip balm  1 application Topical BID  . pantoprazole  40 mg Oral BID AC  . psyllium  1 packet Oral BID  . saccharomyces boulardii  250 mg Oral BID  . sodium chloride   10-40 mL Intracatheter Q12H   Nutritional Goals:  per RD: 1250-1550Kcal/d and protein 69-82g/d. Clinimix 5/15 at goal rate 65 ml/hr + lipids at 14ml/hr on MWF provides:  78g/day protein and 1587 Kcal/day MWF, 1107 Kcal/day STTHS (Avg. 1312 Kcal/day weekly).  Current nutrition:  Advanced dysphagia 2 (8/17) Climimix 5/15 @ 27ml/hr. Lipids 28ml/hr MWF MIVF: NS @ KVO  CBGs & Insulin requirements past 24 hours: CBG 138-193 (50 units insulin per 2L TNA bag) Moderate SSI TIDWC (11 units used)  Labs:   Electrolytes -   Corr Calcium remains high at 14.08 despite using electrolyte-free TNA formula   Mag at low end normal  Phos low end normal  K stable  SCr remains elevated but slightly improved (chronic renal insufficiency, baseline SCr unknown)  LFTs - wnl on 8/19  TG - wnl on 8/12  Prealbumin improving - 14.2 (8/12), 10.3 (8/8)  CBGs now elevated with improved po intake (eating 50-75% of meals).  insulin in TNA + sliding scale coverage, fluctuating some with diet advancement  Assessment:  [name] s/p repair of perforated prepyloric ulcer on 06/10/12. TNA started 8/7  d/t need for bowel rest.   Diet advanced & NGT d/c after passing swallow evaluation 8/16  Appears to be tolerating some solid food  Plan:  Spoke with general surgery, stop TPN with current bag hanging.    D/C TPN labs  F/u trigs and prealbumin (in process)  Juliette Alcide, PharmD, BCPS.   Pager: [phone] 06/24/2012 8:31 AM

## 2012-06-24 NOTE — Progress Notes (Signed)
FL2 in chart for MD signature. CSW has left message for pt's son to contact CSW for assistance with d/c planning. Week end CSW has provided SNF bed offers. Waiting to hear back from pt's son with his SNF choice. CSW will continue to follow to assist with d/c planning.  Cori Razor LCSW [phone]

## 2012-06-24 NOTE — Progress Notes (Signed)
Physical Therapy Treatment Patient Details Name: April [name] MRN: [id number] DOB: November 22, 1934 Today's Date: 06/24/2012 Time: 1350-1404 PT Time Calculation (min): 14 min  PT Assessment / Plan / Recommendation Comments on Treatment Session  Pts mobility slowly improving. Pt continues to demonstrate, general weakness, impaired balance, and limited activity tolerance-fatigues quickly. Required Moderate cueing for safety, technique. Continue to recommend ST rehab at [facility].    Follow Up Recommendations  Skilled nursing facility    Barriers to Discharge        Equipment Recommendations  Defer to next venue    Recommendations for Other Services    Frequency Min 3X/week   Plan Discharge plan remains appropriate    Precautions / Restrictions Precautions Precautions: Fall Precaution Comments: Abdominal incision Restrictions Weight Bearing Restrictions: No   Pertinent Vitals/Pain     Mobility  Bed Mobility Rolling Right: 5: Supervision Right Sidelying to Sit: 4: Min assist Details for Bed Mobility Assistance: VCs safety, technique.  Transfers Transfers: Sit to Stand;Stand to Sit Sit to Stand: 3: Mod assist;With upper extremity assist;From bed Stand to Sit: 3: Mod assist;With upper extremity assist;To chair/3-in-1 Details for Transfer Assistance: VCs safety, technique, hand placement. Assist to rise, stabilize, control descent Ambulation/Gait Ambulation/Gait Assistance: 1: +2 Total assist Ambulation/Gait: Patient Percentage: 70% Ambulation Distance (Feet): 20 Feet Ambulation/Gait Assistance Details: VCs safety, technique, posture, distance from RW. Pt demonstrates posterior bias. Assist to keep weight shifted anteriorly, stabilize throughout ambulation, maneuver wirh RW. Fatigues fairly quickly. Had to bring recliner to pt to sit.  Gait Pattern: Step-through pattern;Decreased stride length;Decreased step length - right;Decreased step length - left    Exercises     PT Diagnosis:      PT Problem List:   PT Treatment Interventions:     PT Goals Acute Rehab PT Goals Pt will go Supine/Side to Sit: with supervision PT Goal: Supine/Side to Sit - Progress: Updated due to goal met Pt will go Sit to Stand: with min assist PT Goal: Sit to Stand - Progress: Progressing toward goal Pt will Ambulate: 51 - 150 feet;with min assist PT Goal: Ambulate - Progress: Progressing toward goal  Visit Information  Last PT Received On: 06/24/12 Assistance Needed: +2    Subjective Data  Subjective: "Good...." Patient Stated Goal: None stated   Cognition  Difficult to assess due to: Hard of hearing/deaf Arousal/Alertness: Awake/alert Behavior During Session: [facility] for tasks performed    Balance     End of Session PT - End of Session Equipment Utilized During Treatment: Gait belt Activity Tolerance: Patient limited by fatigue Patient left: in chair;with call bell/phone within reach;with family/visitor present   GP     Rebeca Alert [facility] 06/24/2012, 2:54 PM [phone]

## 2012-06-24 NOTE — Care Management Note (Signed)
    Page 1 of 2   06/24/2012     3:12:46 PM   CARE MANAGEMENT NOTE 06/24/2012  Patient:  April [name], April [name]   Account Number:  [id number]  Date Initiated:  06/11/2012  Documentation initiated by:  DAVIS,RHONDA  Subjective/Objective Assessment:   perforated gastric ulcer with surgical closure, fld depletion and fld challenge     Action/Plan:   from home   Anticipated DC Date:  06/22/2012   Anticipated DC Plan:  SKILLED NURSING FACILITY  In-house referral  Clinical Social Worker      DC Planning Services  CM consult      [facility] Choice  NA   Choice offered to / List presented to:  NA   DME arranged  NA      DME agency  NA     HH arranged  NA      HH agency  NA   Status of service:  Completed, signed off Medicare Important Message given?  NA - LOS <3 / Initial given by admissions (If response is "NO", the following Medicare IM given date fields will be blank) Date Medicare IM given:   Date Additional Medicare IM given:    Discharge Disposition:  SKILLED NURSING FACILITY  Per UR Regulation:  Reviewed for med. necessity/level of care/duration of stay  If discussed at Long Length of Stay Meetings, dates discussed:    Comments:  [id number]/Rhonda Davis,Rn,BSN,CCM: extubated from vent and p[laced on 40% ventimask,tolerated well. [id number] Davis,RN, BSN, CCM: patient required re intubation pm of [id number] due to acute resp. Failure.  [id number] Earlene Plater, RN, BSN, CCM No discharge needs present at time of this review at the sdu/icu level. Case Management [phone]

## 2012-06-24 NOTE — Discharge Summary (Signed)
[facility] Agree with plans for discharge.  See above. Velora Heckler, MD, [facility] Office: [phone]

## 2012-06-24 NOTE — Progress Notes (Addendum)
Nutrition Follow-up  Intervention: Encouraged increased meal and supplement intake. Will monitor.   Diet Order: Dysphagia 2, thin  Assessment:  Pt had SLP bedside swallow evaluation 8/15 which was limited r/t NGT placement and SLP noted pt at severe aspiration risk and recommended NPO with alternative means of nutrition. SLP saw pt on 8/16 after NGT removal and noted pt with better swallow and clear liquid diet was recommended. SLP met with pt 8/17 and noted pt without signs/symptoms of aspiration and dysphagia 2/thin liquid was recommended. TNA d/c today. Met with pt who reports her appetite has improved and she has been drinking at least 1 Ensure Complete/day and intake has been at least 50% of meals. Noted 48 hour calorie count ordered on 8/17 however there is no saved meal tickets on door or in chart and RN had not been informed over the weekend that this had been started.   Meds: Scheduled Meds:   . acetaminophen  650 mg Oral Q6H  . ipratropium  0.5 mg Nebulization Q6H   And  . albuterol  2.5 mg Nebulization Q6H  . antiseptic oral rinse  15 mL Mouth Rinse QID  . feeding supplement  237 mL Oral BID BM  . heparin  5,000 Units Subcutaneous Q8H  . insulin aspart  0-15 Units Subcutaneous TID WC  . insulin aspart  0-5 Units Subcutaneous QHS  . lip balm  1 application Topical BID  . pantoprazole  40 mg Oral BID AC  . psyllium  1 packet Oral BID  . saccharomyces boulardii  250 mg Oral BID  . sodium chloride  10-40 mL Intracatheter Q12H   Continuous Infusions:   . sodium chloride 10 mL/hr at 06/19/12 1400  . TPN (CLINIMIX) +/- additives 65 mL/hr at 06/22/12 1714  . TPN (CLINIMIX) +/- additives 40 mL/hr at 06/23/12 1737   PRN Meds:.alum & mag hydroxide-simeth, fentaNYL, magic mouthwash, ondansetron, oxyCODONE, sodium chloride  Labs:  CMP     Component Value Date/Time   NA 138 06/24/2012 0440   K 4.0 06/24/2012 0440   CL 114* 06/24/2012 0440   CO2 19 06/24/2012 0440   GLUCOSE 120*  06/24/2012 0440   BUN 44* 06/24/2012 0440   CREATININE 1.48* 06/24/2012 0440   CALCIUM 12.4* 06/24/2012 0440   PROT 5.5* 06/24/2012 0440   ALBUMIN 1.9* 06/24/2012 0440   AST 19 06/24/2012 0440   ALT 26 06/24/2012 0440   ALKPHOS 71 06/24/2012 0440   BILITOT 0.2* 06/24/2012 0440   GFRNONAA 34* 06/24/2012 0440   GFRAA 39* 06/24/2012 0440   CBG (last 3)   Basename 06/23/12 2114 06/23/12 1650 06/23/12 1233  GLUCAP 193* 152* 141*   - Noted pt's BUN trending up - Phosphorus and magnesium WNL - PALB improved from 10.3 mg/dL on 8/8 to 16.1 mg/dL on 0/96 - CBGs elevated   Intake/Output Summary (Last 24 hours) at 06/24/12 1004 Last data filed at 06/24/12 0825  Gross per 24 hour  Intake 1387.59 ml  Output   1950 ml  Net -562.41 ml   Last BM - 06/22/12  Weight Status:   8/13 128 lb 1.4 oz 8/14 132 lb 4.4 oz  Re-estimated needs:  1550-1800 calories 75-90g protein  Nutrition Dx: Inadequate oral intake - improving   Goal:  1. Meet >90% of estimated energy needs with nutrition support - not met, TNA d/c today.  2. Diet advancements as medically able - met.   New goal: Pt to consume >75% of meals/supplements.   Monitor: Weights,  labs, intake   Dietitian# [phone]

## 2012-06-24 NOTE — Progress Notes (Signed)
Patient ID: April [name], female   DOB: July 12, 1935, 77 y.o.   MRN: [id number] 14 Days Post-Op  Subjective: Pt feels well.  Tolerating a dysphagia 2 diet.  No abdominal pain.  Objective: Vital signs in last 24 hours: Temp:  [98 F (36.7 C)-98.3 F (36.8 C)] 98 F (36.7 C) (08/19 0444) Pulse Rate:  [63-71] 63  (08/19 0444) Resp:  [16-18] 16  (08/19 0444) BP: (95-101)/(52-57) 100/57 mmHg (08/19 0444) SpO2:  [92 %-97 %] 94 % (08/19 0444) Last BM Date: 06/22/12  Intake/Output from previous day: 08/18 0701 - 08/19 0700 In: 1859.1 [P.O.:120; I.V.:241; TPN:1498.1] Out: 1700 [Urine:1700] Intake/Output this shift: Total I/O In: -  Out: 250 [Urine:250]  PE: Abd: soft, wound is clean and packed, +BS, ND  Lab Results:   [facility] 06/24/12 0440  WBC 9.8  HGB 8.9*  HCT 27.3*  PLT 446*   BMET  Basename 06/24/12 0440 06/23/12 0448  NA 138 138  K 4.0 3.9  CL 114* 114*  CO2 19 18*  GLUCOSE 120* 133*  BUN 44* 45*  CREATININE 1.48* 1.50*  CALCIUM 12.4* 11.7*   PT/INR No results found for this basename: LABPROT:2,INR:2 in the last 72 hours CMP     Component Value Date/Time   NA 138 06/24/2012 0440   K 4.0 06/24/2012 0440   CL 114* 06/24/2012 0440   CO2 19 06/24/2012 0440   GLUCOSE 120* 06/24/2012 0440   BUN 44* 06/24/2012 0440   CREATININE 1.48* 06/24/2012 0440   CALCIUM 12.4* 06/24/2012 0440   PROT 5.5* 06/24/2012 0440   ALBUMIN 1.9* 06/24/2012 0440   AST 19 06/24/2012 0440   ALT 26 06/24/2012 0440   ALKPHOS 71 06/24/2012 0440   BILITOT 0.2* 06/24/2012 0440   GFRNONAA 34* 06/24/2012 0440   GFRAA 39* 06/24/2012 0440   Lipase  No results found for this basename: lipase       Studies/Results: No results found.  Anti-infectives: Anti-infectives     Start     Dose/Rate Route Frequency Ordered Stop   06/13/12 2200   vancomycin (VANCOCIN) 500 mg in sodium chloride 0.9 % 100 mL IVPB  Status:  Discontinued        500 mg 100 mL/hr over 60 Minutes Intravenous Every 24 hours  06/12/12 2218 06/15/12 0840   06/12/12 2230   vancomycin (VANCOCIN) 750 mg in sodium chloride 0.9 % 150 mL IVPB        750 mg 150 mL/hr over 60 Minutes Intravenous  Once 06/12/12 2218 06/12/12 2330   06/11/12 1800   piperacillin-tazobactam (ZOSYN) IVPB 3.375 g  Status:  Discontinued        3.375 g 12.5 mL/hr over 240 Minutes Intravenous Every 8 hours 06/11/12 1343 06/22/12 1039   06/11/12 0200   piperacillin-tazobactam (ZOSYN) IVPB 3.375 g  Status:  Discontinued        3.375 g 12.5 mL/hr over 240 Minutes Intravenous Every 8 hours 06/11/12 0133 06/11/12 1341   06/10/12 1945   ertapenem (INVANZ) 1 g in sodium chloride 0.9 % 50 mL IVPB        1 g 100 mL/hr over 30 Minutes Intravenous  Once 06/10/12 1930 06/10/12 2023           Assessment/Plan  1. S/p oversew of perf gastric ulcer 2. VDRF x2, now extubated and breathing well. 3. DM 4. HTN 5. PCM/TNA 6. Hypercalcemia, unknown cause  Plan: 1. Awaiting son's selection of SNF.  Surgically the patient is stable and is ready  to be dc when patient has a bed. 2. Dc TNA 3. Check calcium in am   LOS: 14 days    Jenasis Straley E 06/24/2012

## 2012-06-24 NOTE — Progress Notes (Signed)
Speech Language Pathology Dysphagia Treatment Patient Details Name: LORICE LAFAVE MRN: [id number] DOB: 1935-09-13 Today's Date: 06/24/2012 Time: 4098-1191 SLP Time Calculation (min): 29 min  Assessment / Plan / Recommendation Clinical Impression  Pt seen for diet tolerance and education regarding compensatory strategies for COPD resulting in swallow and breathing discoordination.  Pt located her denture over the weekend, daughter reports it was on the floor.  Observed pt to eat cracker with adequate mastication and drink water without clinical s/s of aspiration and no incr work of breathing.  Pt reports she doesn't use her dentures because they fall out at night and she is worried they will get lost.  Advised pt to use daily and have her teeth removed and cleaned.  Educated pt and daughter to aspiration precautions and compensatory strategies for her COPD.  Pt and daughter verbalized understanding to information provided.     Diet Recommendation  Initiate / Change Diet: Regular;Thin liquid    SLP Plan All goals met   Pertinent Vitals/Pain Afebrile, decreased   Swallowing Goals  SLP Swallowing Goals Swallow Study Goal #1 - Progress: Met Swallow Study Goal #2 - Progress: Met  General Temperature Spikes Noted: No Respiratory Status: Supplemental O2 delivered via (comment) Behavior/Cognition: Alert;Cooperative;Hard of hearing;Requires cueing Oral Cavity - Dentition: Dentures, bottom;Dentures, top Patient Positioning: Upright in bed  Oral Cavity - Oral Hygiene   SLP aided pt to place dentures.    Dysphagia Treatment Treatment focused on: Skilled observation of diet tolerance;Patient/family/caregiver education;Utilization of compensatory strategies Family/Caregiver Educated: eldest daughter Treatment Methods/Modalities: Skilled observation Type of PO's observed: Regular;Thin liquids Feeding: Able to feed self Liquids provided via: Cup Type of cueing: Verbal Amount of cueing: Minimal  (to take small boluses)   GO     Donavan Burnet, MS [facility] SLP [phone]

## 2012-06-24 NOTE — Progress Notes (Signed)
General Surgery [facility]  Patient seen and examined.  Local wound care ongoing.  Await SNF placement this week. Velora Heckler, MD, [facility] Office: [phone]

## 2012-06-25 LAB — HELICOBACTER PYLORI ABS-IGG+IGA, BLD: H Pylori IgA: 32.5 U/mL — ABNORMAL HIGH (ref <9.0)

## 2012-06-25 NOTE — Progress Notes (Signed)
Clinical Social Work Department CLINICAL SOCIAL WORK PLACEMENT NOTE 06/25/2012  Patient:  April [name], April [name]  Account Number:  [id number] Admit date:  06/10/2012  Clinical Social Worker:  Cori Razor, LCSW  Date/time:  06/24/2012 09:13 AM  Clinical Social Work is seeking post-discharge placement for this patient at the following level of care:   SKILLED NURSING   (*CSW will update this form in Epic as items are completed)   06/22/2012  Patient/family provided with [facility] Department of Clinical Social Work's list of facilities offering this level of care within the geographic area requested by the patient (or if unable, by the patient's family).    Patient/family informed of their freedom to choose among providers that offer the needed level of care, that participate in Medicare, Medicaid or managed care program needed by the patient, have an available bed and are willing to accept the patient.    Patient/family informed of [facility]' ownership interest in [facility], as well as of the fact that they are under no obligation to receive care at this facility.  PASARR submitted to EDS on 06/21/2012 PASARR number received from EDS on 06/21/2012  FL2 transmitted to all facilities in geographic area requested by pt/family on  06/21/2012 FL2 transmitted to all facilities within larger geographic area on   Patient informed that his/her managed care company has contracts with or will negotiate with  certain facilities, including the following:     Patient/family informed of bed offers received:  06/22/2012 Patient chooses bed at [facility] Physician recommends and patient chooses bed at    Patient to be transferred to [facility] on  06/24/2012 Patient to be transferred to facility by [facility]  The following physician request were entered in Epic:   Additional Comments:  Cori Razor LCSW [phone]

## 2012-07-04 ENCOUNTER — Ambulatory Visit (INDEPENDENT_AMBULATORY_CARE_PROVIDER_SITE_OTHER): Payer: PRIVATE HEALTH INSURANCE | Admitting: General Surgery

## 2012-07-04 ENCOUNTER — Encounter (INDEPENDENT_AMBULATORY_CARE_PROVIDER_SITE_OTHER): Payer: Self-pay | Admitting: General Surgery

## 2012-07-04 VITALS — BP 98/52 | HR 72 | Temp 97.9°F | Resp 20 | Ht 60.0 in | Wt 115.0 lb

## 2012-07-04 DIAGNOSIS — Z9889 Other specified postprocedural states: Secondary | ICD-10-CM

## 2012-07-04 NOTE — Patient Instructions (Signed)
Do not take Aspirin or Aspirin like products.

## 2012-07-04 NOTE — Progress Notes (Signed)
Operation:emergency exploratory laparotomy and repair perforated prepyloric gastric ulcer  Date:June 10, 2012  Pathology:benign  HPI:  She is here for her first postoperative visit. She is at a skilled nursing facility currently. Her H. Pylori was positive and this was treated appropriately. Eating okay. Bowels are moving. Taking Protonix BID.  She is also taking aspirin.   Physical Exam: Abdomen- soft, lower aspect of the incision is superficially open and clean, wound was redressed  Assessment:  Making satisfactory postoperative progress.  Plan:  Stop aspirin and aspirin like products. Continue proton pump inhibitor. Return visit one month. Will recommend gastroenterology consultation and upper endoscopy to confirm healing of ulcer at that time.

## 2012-07-31 ENCOUNTER — Ambulatory Visit (INDEPENDENT_AMBULATORY_CARE_PROVIDER_SITE_OTHER): Payer: PRIVATE HEALTH INSURANCE | Admitting: General Surgery

## 2012-07-31 ENCOUNTER — Encounter (INDEPENDENT_AMBULATORY_CARE_PROVIDER_SITE_OTHER): Payer: Self-pay | Admitting: General Surgery

## 2012-07-31 VITALS — BP 116/60 | HR 45 | Temp 96.8°F | Resp 20 | Ht 60.0 in | Wt 115.4 lb

## 2012-07-31 DIAGNOSIS — Z9889 Other specified postprocedural states: Secondary | ICD-10-CM

## 2012-07-31 NOTE — Progress Notes (Signed)
Operation:emergency exploratory laparotomy and repair perforated prepyloric gastric ulcer  Date:June 10, 2012  Pathology:benign  HPI:  She is here for her second postoperative visit. She is still at a skilled nursing facility currently.  She is tolerating her diet.  Her wound is healing well.  She is taking the PPI.  Physical Exam: Abdomen- soft, lower aspect of the incision is less open and clean, wound was redressed  Assessment:  Making satisfactory postoperative progress.  Plan:  . Continue proton pump inhibitor. Return visit one month. Will recommend gastroenterology consultation and upper endoscopy to confirm healing of ulcer at that time.

## 2012-07-31 NOTE — Patient Instructions (Signed)
Dry dressing to wound daily

## 2012-09-02 ENCOUNTER — Encounter (INDEPENDENT_AMBULATORY_CARE_PROVIDER_SITE_OTHER): Payer: Self-pay | Admitting: General Surgery

## 2012-09-02 ENCOUNTER — Ambulatory Visit (INDEPENDENT_AMBULATORY_CARE_PROVIDER_SITE_OTHER): Payer: PRIVATE HEALTH INSURANCE | Admitting: General Surgery

## 2012-09-02 VITALS — BP 128/78 | HR 66 | Temp 98.0°F | Ht 61.0 in | Wt 122.4 lb

## 2012-09-02 DIAGNOSIS — Z9889 Other specified postprocedural states: Secondary | ICD-10-CM

## 2012-09-02 NOTE — Progress Notes (Signed)
Operation:emergency exploratory laparotomy and repair perforated prepyloric gastric ulcer  Date:June 10, 2012  Pathology:benign  HPI:  She is here for her third postoperative visit. She is still at a skilled nursing facility currently.  She is tolerating her diet.   She is taking the PPI.  She is not taking Aspirin according to her MAR.  Physical Exam: Abdomen- soft, incision is healed.  Assessment: Slowly improving.  Wound is healed.  Plan:  . Continue proton pump inhibitor.   Refer to GI for upper endoscopy to document healing of ulcer.

## 2012-09-02 NOTE — Patient Instructions (Signed)
We will refer you to a gastrointestinal specialist for upper endoscopy to see if the ulcer has healed.

## 2012-09-05 ENCOUNTER — Telehealth (INDEPENDENT_AMBULATORY_CARE_PROVIDER_SITE_OTHER): Payer: Self-pay

## 2012-09-05 NOTE — Telephone Encounter (Signed)
Notified pt's daughter of appt with Dr. Bosie Clos 09/09/12 at 2:45pm for upper endoscopy.

## 2012-09-06 ENCOUNTER — Other Ambulatory Visit: Payer: Self-pay | Admitting: Neurosurgery

## 2012-09-06 DIAGNOSIS — D352 Benign neoplasm of pituitary gland: Secondary | ICD-10-CM

## 2012-09-10 ENCOUNTER — Telehealth: Payer: Self-pay | Admitting: *Deleted

## 2012-09-10 NOTE — Telephone Encounter (Signed)
Yes needs f/u ov - prefer Dr Craige Cotta as he did the bulk of her hosp work but I can see her if he can't w/in the next 2 weeks

## 2012-09-10 NOTE — Telephone Encounter (Signed)
Dr. Sherene Sires, I had reminder in my basket-unsure if she needs a consult with [facility] or just HFU. Please advise thanks!

## 2012-09-10 NOTE — Telephone Encounter (Signed)
Unable to reach patient--phone line d/c [phone] atc x 3. No other numbers to reach patient.

## 2012-09-10 NOTE — Telephone Encounter (Signed)
Message copied by Christen Butter on Tue Sep 10, 2012  9:12 AM ------      Message from: Sandrea Hughs B      Created: Mon Jun 24, 2012  3:09 PM       Needs f/u with cxr by now - prefer Craige Cotta do her f/u

## 2012-09-13 NOTE — Telephone Encounter (Signed)
ATC back [place], [place] and no option to leave a msg, [employer]

## 2012-09-13 NOTE — Telephone Encounter (Signed)
Notify them if she gets to point where she can come to office she needs to see Dr Craige Cotta but if she's so debilitated that can't do f/u with [place] there is little for to do about the tiny nodule that was incidentally discovered and is a moot issue.  Copy to her primary please

## 2012-09-13 NOTE — Telephone Encounter (Signed)
i spoke with Kathie Rhodes and she stated pt is in a "rest home" now called [facility] and stated pt would not be able to come in to see [place]. Will forward to MW so he is aware

## 2012-09-17 NOTE — Telephone Encounter (Signed)
I spoke with Verlon Au at [facility] at [phone]. Pt is scheduled for HFU with VS on Mon., 10/14/12 @ 1:45 and told to arrive at 1:30.

## 2012-09-30 ENCOUNTER — Telehealth (INDEPENDENT_AMBULATORY_CARE_PROVIDER_SITE_OTHER): Payer: Self-pay | Admitting: General Surgery

## 2012-09-30 NOTE — Telephone Encounter (Signed)
Erie Noe with Dr Marge Duncans office called to let us know that patient no showed appt with them today.

## 2012-09-30 NOTE — Telephone Encounter (Signed)
Unable to contact this patient.  Telephone number disconnected.  Emergency contact no answer.

## 2012-10-14 ENCOUNTER — Ambulatory Visit (INDEPENDENT_AMBULATORY_CARE_PROVIDER_SITE_OTHER): Payer: Medicare Other | Admitting: Pulmonary Disease

## 2012-10-14 ENCOUNTER — Encounter: Payer: Self-pay | Admitting: Pulmonary Disease

## 2012-10-14 VITALS — BP 118/68 | HR 57 | Temp 97.3°F | Ht 61.0 in | Wt 125.0 lb

## 2012-10-14 DIAGNOSIS — Z87891 Personal history of nicotine dependence: Secondary | ICD-10-CM

## 2012-10-14 DIAGNOSIS — R911 Solitary pulmonary nodule: Secondary | ICD-10-CM

## 2012-10-14 NOTE — Progress Notes (Signed)
  Subjective:    Patient ID: April [name], female    DOB: 05-02-1935, 77 y.o.   MRN: [id number]  HPI April [name] is a 77 y.o. female former smoker for follow up of pulmonary nodule and dyspnea.  She has been doing well since hospital discharge in August.  She is not needing oxygen.  She is not smoking anymore.  She is not using inhalers.  She gets occasional cough.  She denies wheeze, sputum, chest pain, or hemoptysis.  She gets tired and week in her legs.  This limits her activities.  She does not feel her breathing limits her activities.  Tests:  Past Medical History  Diagnosis Date  . Diabetes mellitus   . Hyperthyroidism   . Hypertension   . Perforated ulcer    Past Surgical History  Procedure Date  . 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 Laparotomy 06/10/2012    Procedure: Omental patch of perforated ulcer, EXPLORATORY LAPAROTOMY;  Surgeon: Adolph Pollack, MD;  Location: [facility];  Service: General;  Laterality: N/A;  closure of perforated gastric ulcer   Current Outpatient Prescriptions on File Prior to Visit  Medication Sig Dispense Refill  . aspirin 81 MG tablet Take 81 mg by mouth daily.        Marland Kitchen atorvastatin (LIPITOR) 20 MG tablet Take 20 mg by mouth daily.        . pantoprazole (PROTONIX) 40 MG tablet Take 1 tablet (40 mg total) by mouth 2 (two) times daily before a meal.  60 tablet  2  . sitaGLIPtan-metformin (JANUMET) 50-500 MG per tablet Take 1 tablet by mouth 2 (two) times daily with a meal.         Allergies  Allergen Reactions  . Nsaids     Pt had perforated ulcer - patched [id number]      Review of Systems     Objective:   Physical Exam Filed Vitals:   10/14/12 1349  BP: 118/68  Pulse: 57  Temp: 97.3 F (36.3 C)  Height: 5\' 1"  (1.549 m)  Weight: 125 lb (56.7 kg)  SpO2: 91%   Wt Readings from Last 3 Encounters:  10/14/12 125 lb (56.7 kg)  09/02/12 122 lb 6.4 oz (55.52 kg)  07/31/12 115 lb 6.4 oz (52.345 kg)   General - no distress HEENT  - no sinus tenderness, no oral exudate, no LAN Cardiac - s1s2 regular, no murmur Chest - no wheeze/rales/dullness Abd - soft, non tender Ext - no edema Neuro - normal strength Psych - normal mood, behavior       Assessment & Plan:

## 2012-10-14 NOTE — Assessment & Plan Note (Signed)
She will need non contrast CT chest for August 2014.

## 2012-10-14 NOTE — Assessment & Plan Note (Signed)
She has prior history of smoking.  She denies significant respiratory symptoms for obstructive lung disease.  She had difficulty performing spirometry today.  Will monitor clinically off inhaler therapy.

## 2012-10-14 NOTE — Patient Instructions (Signed)
Will schedule CT chest for August 2014 Follow up in August 2014 after CT chest done

## 2013-02-04 ENCOUNTER — Non-Acute Institutional Stay (SKILLED_NURSING_FACILITY): Payer: PRIVATE HEALTH INSURANCE | Admitting: Adult Health

## 2013-02-04 ENCOUNTER — Encounter: Payer: Self-pay | Admitting: Adult Health

## 2013-02-04 DIAGNOSIS — E119 Type 2 diabetes mellitus without complications: Secondary | ICD-10-CM

## 2013-02-04 DIAGNOSIS — E785 Hyperlipidemia, unspecified: Secondary | ICD-10-CM

## 2013-02-04 DIAGNOSIS — I10 Essential (primary) hypertension: Secondary | ICD-10-CM

## 2013-02-04 DIAGNOSIS — K255 Chronic or unspecified gastric ulcer with perforation: Secondary | ICD-10-CM

## 2013-02-04 DIAGNOSIS — E210 Primary hyperparathyroidism: Secondary | ICD-10-CM

## 2013-02-04 DIAGNOSIS — K5900 Constipation, unspecified: Secondary | ICD-10-CM

## 2013-03-19 ENCOUNTER — Encounter: Payer: Self-pay | Admitting: Pulmonary Disease

## 2013-03-19 ENCOUNTER — Other Ambulatory Visit: Payer: Self-pay | Admitting: Geriatric Medicine

## 2013-03-19 MED ORDER — OXYCODONE HCL 5 MG PO CAPS: ORAL_CAPSULE | ORAL | Status: DC

## 2013-04-17 DIAGNOSIS — K5900 Constipation, unspecified: Secondary | ICD-10-CM | POA: Insufficient documentation

## 2013-04-17 NOTE — Assessment & Plan Note (Signed)
Will continue senna s 2 tabs twice daily

## 2013-04-17 NOTE — Assessment & Plan Note (Signed)
Without change in status her pth is 55.80; with Ca++ of 11.5 ;will continue to monitor her status

## 2013-04-17 NOTE — Assessment & Plan Note (Signed)
Is not presently taking medications will continue to monitor her status

## 2013-04-17 NOTE — Assessment & Plan Note (Signed)
Is stable will continue janumet 50/500 mg twice daily and will monitor

## 2013-04-17 NOTE — Assessment & Plan Note (Signed)
Is doing well; no further bleeding present is taking protonix 40 mg twice daily and will continue to monitor

## 2013-04-17 NOTE — Progress Notes (Signed)
Patient ID: April [name], female   DOB: 07/28/1935, 78 y.o.   MRN: [id number]  FACILITY: [facility]  Allergies  Allergen Reactions  . Nsaids     Pt had perforated ulcer - patched [id number]     Chief Complaint  Patient presents with  . Medical Managment of Chronic Issues    HPI:  She is being seen for the management of her chronic illnesses; she is doing well; there are no concerns being voiced by the staff or the patient. Her weight remains stable; there are no reports of change in appetite.   Past Medical History  Diagnosis Date  . Diabetes mellitus   . Hyperthyroidism   . Hypertension   . Perforated ulcer     Past Surgical History  Procedure Laterality Date  . 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 Laparotomy  06/10/2012    Procedure: Omental patch of perforated ulcer, EXPLORATORY LAPAROTOMY;  Surgeon: Adolph Pollack, MD;  Location: [facility];  Service: General;  Laterality: N/A;  closure of perforated gastric ulcer    VITAL SIGNS BP 124/89  Pulse 65  Ht 5\' 2"  (1.575 m)  Wt 138 lb (62.596 kg)  BMI 25.23 kg/m2   Patient's Medications  New Prescriptions   No medications on file  Previous Medications   ASPIRIN 81 MG TABLET    Take 81 mg by mouth daily.     ATORVASTATIN (LIPITOR) 20 MG TABLET    Take 20 mg by mouth daily.     PANTOPRAZOLE (PROTONIX) 40 MG TABLET    Take 1 tablet (40 mg total) by mouth 2 (two) times daily before a meal.   SENNOSIDES-DOCUSATE SODIUM (SENOKOT-S) 8.6-50 MG TABLET    Take 2 tablets by mouth 2 (two) times daily.   SITAGLIPTAN-METFORMIN (JANUMET) 50-500 MG PER TABLET    Take 1 tablet by mouth 2 (two) times daily with a meal.    Modified Medications   Modified Medication Previous Medication   OXYCODONE (OXY-IR) 5 MG CAPSULE oxycodone (OXY-IR) 5 MG capsule      Take 1 to 2 tabs every four hours  as needed for pain.    Take 5 mg by mouth every 4 (four) hours as needed. Take 1 or 2 tabs as needed  Discontinued Medications   No medications  on file    SIGNIFICANT DIAGNOSTIC EXAMS  /2006 Cardiolite EF of 77% with no evidence of infarct or ischemia  12/2004 Bilateral carotid dopplers no evidence of stenosis 12/2004 2-D Echo EF of 55-65%  06/10/2012 Chest x-ray low volume exam without acute process 06/10/2012 CT Chest Negative for thoracic aortic dissection. Thoracic atherosclerosis dent. COPD/emphysema 7mm anterior left upper lobe indeterminate nodule. Follow-up 6-12 months.  06/10/2012 CT abdomen/pelvis pneumoperitoneum with wall thickening and edema as well as mucosal enhancement the stomach. Perforated gastric ulcer could have this appearance versus other perforated bowel. Wall thickening mucosal enhancement of the distal small bowel and portions of the colon compatible with enterocolitis without obstruction or pneumatosis. Diffuse mesenteric congestion with abdominal and pelvic  ascites.    LABS REVIEWED:   06/10/2012 wbc 8.4; hgb 15.1; hct 44.7 plt 275; glucose 279; bun 27; creat 1.95; k+ 3.4; na++ 140  06/12/2012 wbc 13.3; hgb 11.0; hct 32.5;plt178; glucose 81; bun 27; creat 2.04; k+ 4.2; na++ 143  liver normal albumin 3.4  10-01-12: glucose 118; bun 27; creat 1.73; k+ 4.6; na++ 142; liver normal albumin 3.8; ca++ 11.9 chol 118; ldl 54; trig 103;  hgb a1c 6.1  11-11-12: ca++ 11.0; ionized ca++ 1.61 11-13-12: pth: 558.0; ca++11.5    Review of Systems  Constitutional: Negative for malaise/fatigue.  Respiratory: Negative for cough and shortness of breath.   Cardiovascular: Negative for chest pain and leg swelling.  Gastrointestinal: Negative for heartburn, abdominal pain and constipation.  Musculoskeletal: Negative for myalgias and joint pain.  Skin: Negative.   Neurological: Negative for headaches.  Psychiatric/Behavioral: Negative for depression. The patient does not have insomnia.      Physical Exam  Constitutional:  thin  Neck: Neck supple. No JVD present. No thyromegaly present.  Cardiovascular: Normal rate, regular rhythm  and intact distal pulses.   Respiratory: Effort normal and breath sounds normal. No respiratory distress.  GI: Soft. Bowel sounds are normal. She exhibits no distension. There is no tenderness.  Musculoskeletal: Normal range of motion. She exhibits no edema.  Uses wheelchair  Neurological: She is alert.  Skin: Skin is warm and dry.  Psychiatric: She has a normal mood and affect.      ASSESSMENT/ PLAN:  HTN (hypertension) Is not presently taking medications will continue to monitor her status   Hyperlipidemia Will continue lipitor 20 mg daily and will monitor her status  Hyperparathyroidism, primary Without change in status her pth is 55.80; with Ca++ of 11.5 ;will continue to monitor her status   DM (diabetes mellitus) Is stable will continue janumet 50/500 mg twice daily and will monitor  Perforated gastric ulcer Is doing well; no further bleeding present is taking protonix 40 mg twice daily and will continue to monitor  Unspecified constipation Will continue senna s 2 tabs twice daily     Will check cbc; cmp hgb a1c and lipids

## 2013-04-17 NOTE — Assessment & Plan Note (Signed)
Will continue lipitor 20 mg daily and will monitor her status

## 2013-04-18 ENCOUNTER — Encounter: Payer: Self-pay | Admitting: Nurse Practitioner

## 2013-04-18 ENCOUNTER — Non-Acute Institutional Stay (SKILLED_NURSING_FACILITY): Payer: PRIVATE HEALTH INSURANCE | Admitting: Nurse Practitioner

## 2013-04-18 DIAGNOSIS — K5900 Constipation, unspecified: Secondary | ICD-10-CM

## 2013-04-18 DIAGNOSIS — N184 Chronic kidney disease, stage 4 (severe): Secondary | ICD-10-CM

## 2013-04-18 DIAGNOSIS — E119 Type 2 diabetes mellitus without complications: Secondary | ICD-10-CM

## 2013-04-18 DIAGNOSIS — I10 Essential (primary) hypertension: Secondary | ICD-10-CM

## 2013-04-18 DIAGNOSIS — E785 Hyperlipidemia, unspecified: Secondary | ICD-10-CM

## 2013-04-18 NOTE — Progress Notes (Signed)
Patient ID: [name], female   DOB: 21-Oct-1935, 78 y.o.   MRN: [id number]  Nursing Home Location:  [facility] [place]   Place of Service: SNF [phone])   Chief Complaint: medical management of chronic conditions   HPI:  Ms Hanssen is a 78 female with a PMH of HTN, Hyperlipidemia, benign tumor of pituitary gland, hyperparathyroidism, renal insuffiencey and constipation  is being seen for the management of her chronic illnesses; she is doing well; there are no concerns by the staff or the patient.  Review of Systems:  Review of Systems  Constitutional: Negative for fever, chills and weight loss.  HENT: Negative for congestion and sore throat.   Respiratory: Negative for cough and shortness of breath.   Cardiovascular: Negative for chest pain, palpitations and leg swelling.  Gastrointestinal: Positive for diarrhea (occasional diarrhea). Negative for abdominal pain and constipation.  Genitourinary: Negative for dysuria, urgency and frequency.  Musculoskeletal: Positive for joint pain (knee pain). Negative for myalgias.  Skin: Negative for itching and rash.  Neurological: Negative for dizziness, weakness and headaches.  Psychiatric/Behavioral: Negative for depression. The patient is not nervous/anxious and does not have insomnia.      Medications: Patient's Medications  New Prescriptions   No medications on file  Previous Medications   ATORVASTATIN (LIPITOR) 20 MG TABLET    Take 20 mg by mouth daily.     OXYCODONE (OXY-IR) 5 MG CAPSULE    Take 1 to 2 tabs every four hours  as needed for pain.   PANTOPRAZOLE (PROTONIX) 40 MG TABLET    Take 1 tablet (40 mg total) by mouth 2 (two) times daily before a meal.   SENNOSIDES-DOCUSATE SODIUM (SENOKOT-S) 8.6-50 MG TABLET    Take 2 tablets by mouth 2 (two) times daily.   SITAGLIPTAN-METFORMIN (JANUMET) 50-500 MG PER TABLET    Take 1 tablet by mouth 2 (two) times daily with a meal.    Modified Medications   No medications on file   Discontinued Medications   ASPIRIN 81 MG TABLET    Take 81 mg by mouth daily.       Physical Exam:  Filed Vitals:   04/18/13 1253  BP: 99/75  Pulse: 62  Temp: 96.5 F (35.8 C)  Resp: 18    Physical Exam  Constitutional: She is well-developed, well-nourished, and in no distress. No distress.  HENT:  Head: Normocephalic and atraumatic.  Right Ear: External ear normal.  Left Ear: External ear normal.  Nose: Nose normal.  Mouth/Throat: Oropharynx is clear and moist. No oropharyngeal exudate.  Eyes: Conjunctivae and EOM are normal. Pupils are equal, round, and reactive to light.  Neck: Normal range of motion. Neck supple.  Cardiovascular: Normal rate, regular rhythm and normal heart sounds.   Pulmonary/Chest: Effort normal and breath sounds normal.  Abdominal: Soft. Bowel sounds are normal. She exhibits no distension.  Musculoskeletal: She exhibits no edema and no tenderness.  Self propels in wc  Neurological: She is alert.  Skin: Skin is warm and dry. She is not diaphoretic.  Psychiatric: Affect normal.     Labs reviewed: 03/26/13: sodium 141, potassium 4.2, glucose 123, BUN 23, CR 2.57 02/05/13: A1c 6.1  Cholesterol 99, triglyceride 69, hdl 34, LDL 51  Wbc 7.1, rbc 3.81, hgb 11.0, hct 33.3  Sodium 145, potassium 4.5, glucose 89, BUN 31, Cr 2.22  ASL 9, ALT 8   Assessment/Plan  1.   Unspecified constipation 564.00     Stable using PRNs   2.  DM (diabetes mellitus) 250.00     At this time will dc janumet due to increase in CR and start janiva 25 mg daily    3.   HTN (hypertension) 401.9     Stable- pt not on any medication at this time   4.   CRF (chronic renal failure), stage 4 (severe)   Stopping janumet at this time- will follow up on Cr in 2 weeks   5.  Hyperlipidemia   Stable on current medications

## 2013-05-07 ENCOUNTER — Ambulatory Visit: Payer: Medicare Other | Admitting: Pulmonary Disease

## 2013-06-01 IMAGING — CR DG CHEST 1V PORT
1 series · 1 of 1 positions shown · non-contrast
Comparison: 06/12/2012

CLINICAL DATA: Edema, weakness.

PORTABLE CHEST - 1 VIEW

[AP]
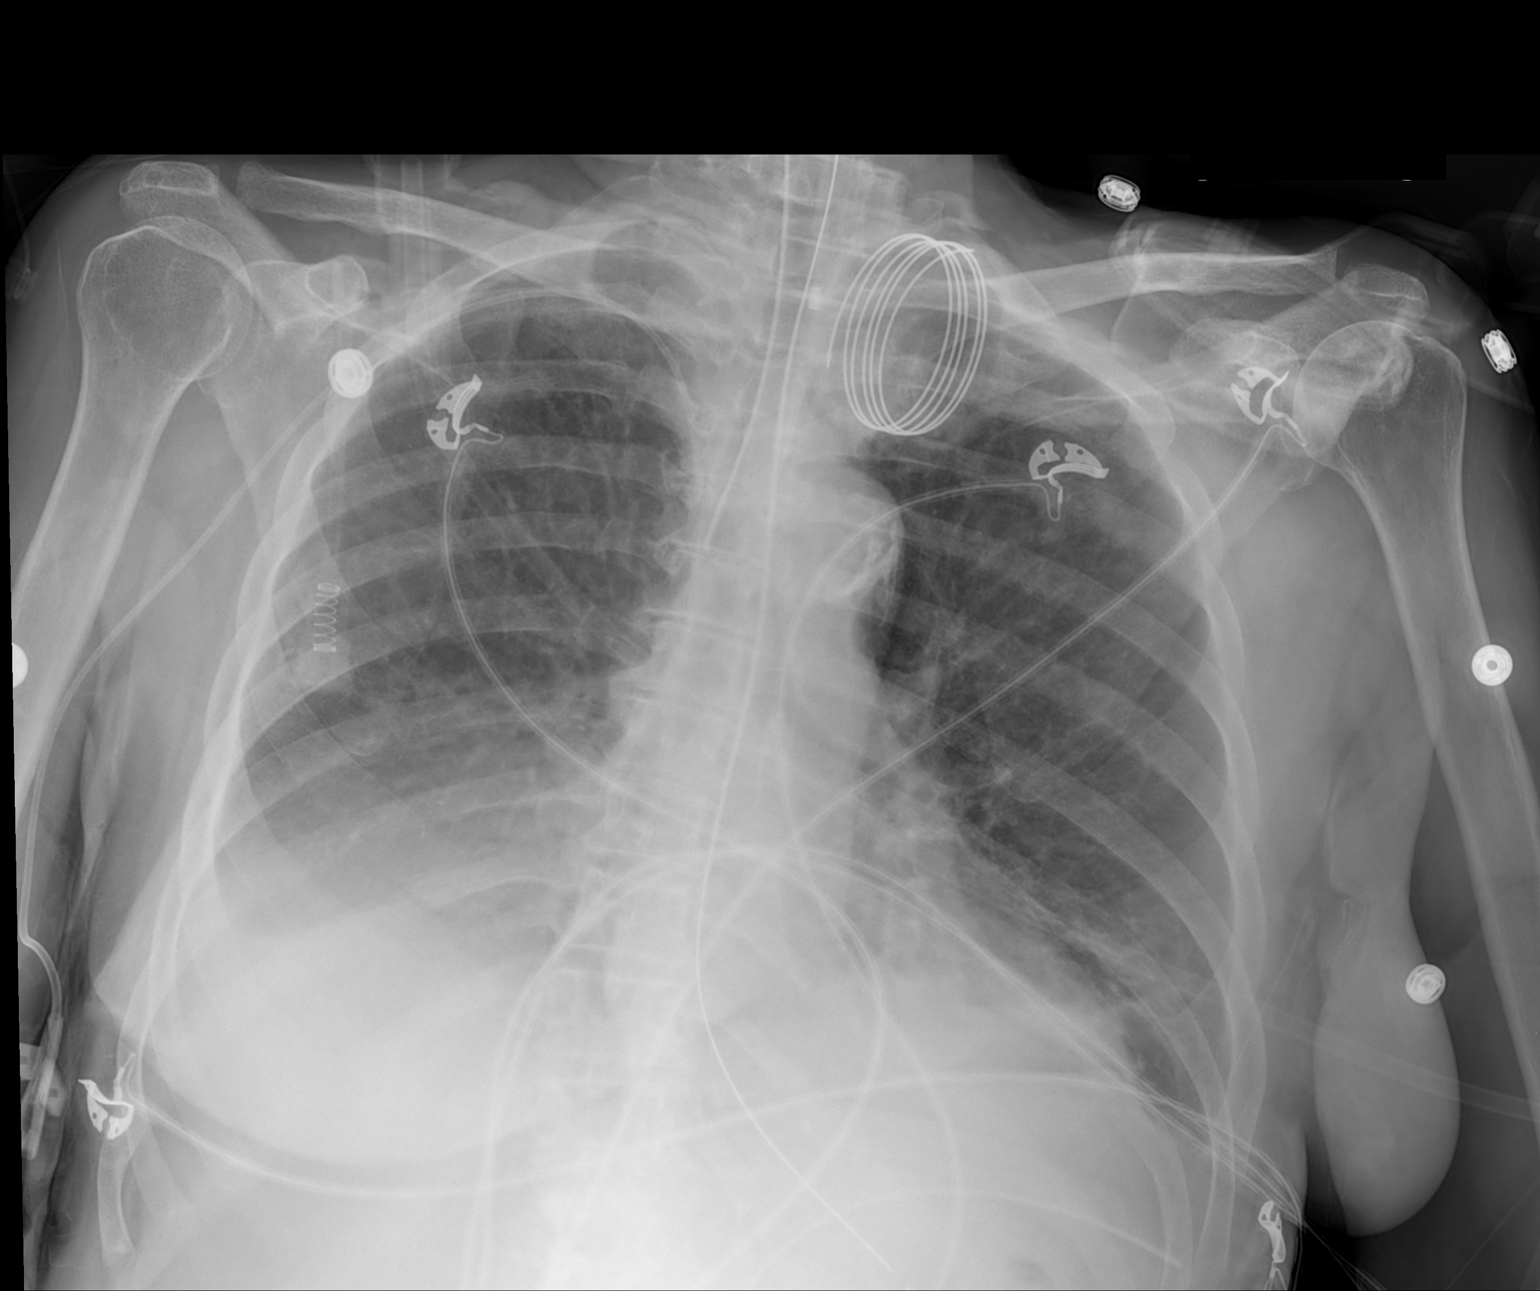

[1 of 1 positions shown; findings below may reference images not displayed]

FINDINGS: Support devices are in stable position.  Bilateral lower
lobe airspace opacities and effusions again noted, right greater
than left.  These are stable since prior study.  Heart is normal
size.  No acute bony abnormality.
IMPRESSION: Stable bilateral lower lobe airspace opacities and effusions, right
greater than left.

## 2013-06-02 IMAGING — CR DG CHEST 1V PORT
1 series · 1 of 1 positions shown · non-contrast
Comparison: 06/13/2012.

CLINICAL DATA: Pleural effusions.  Ventilated patient.

PORTABLE CHEST - 1 VIEW

[AP]
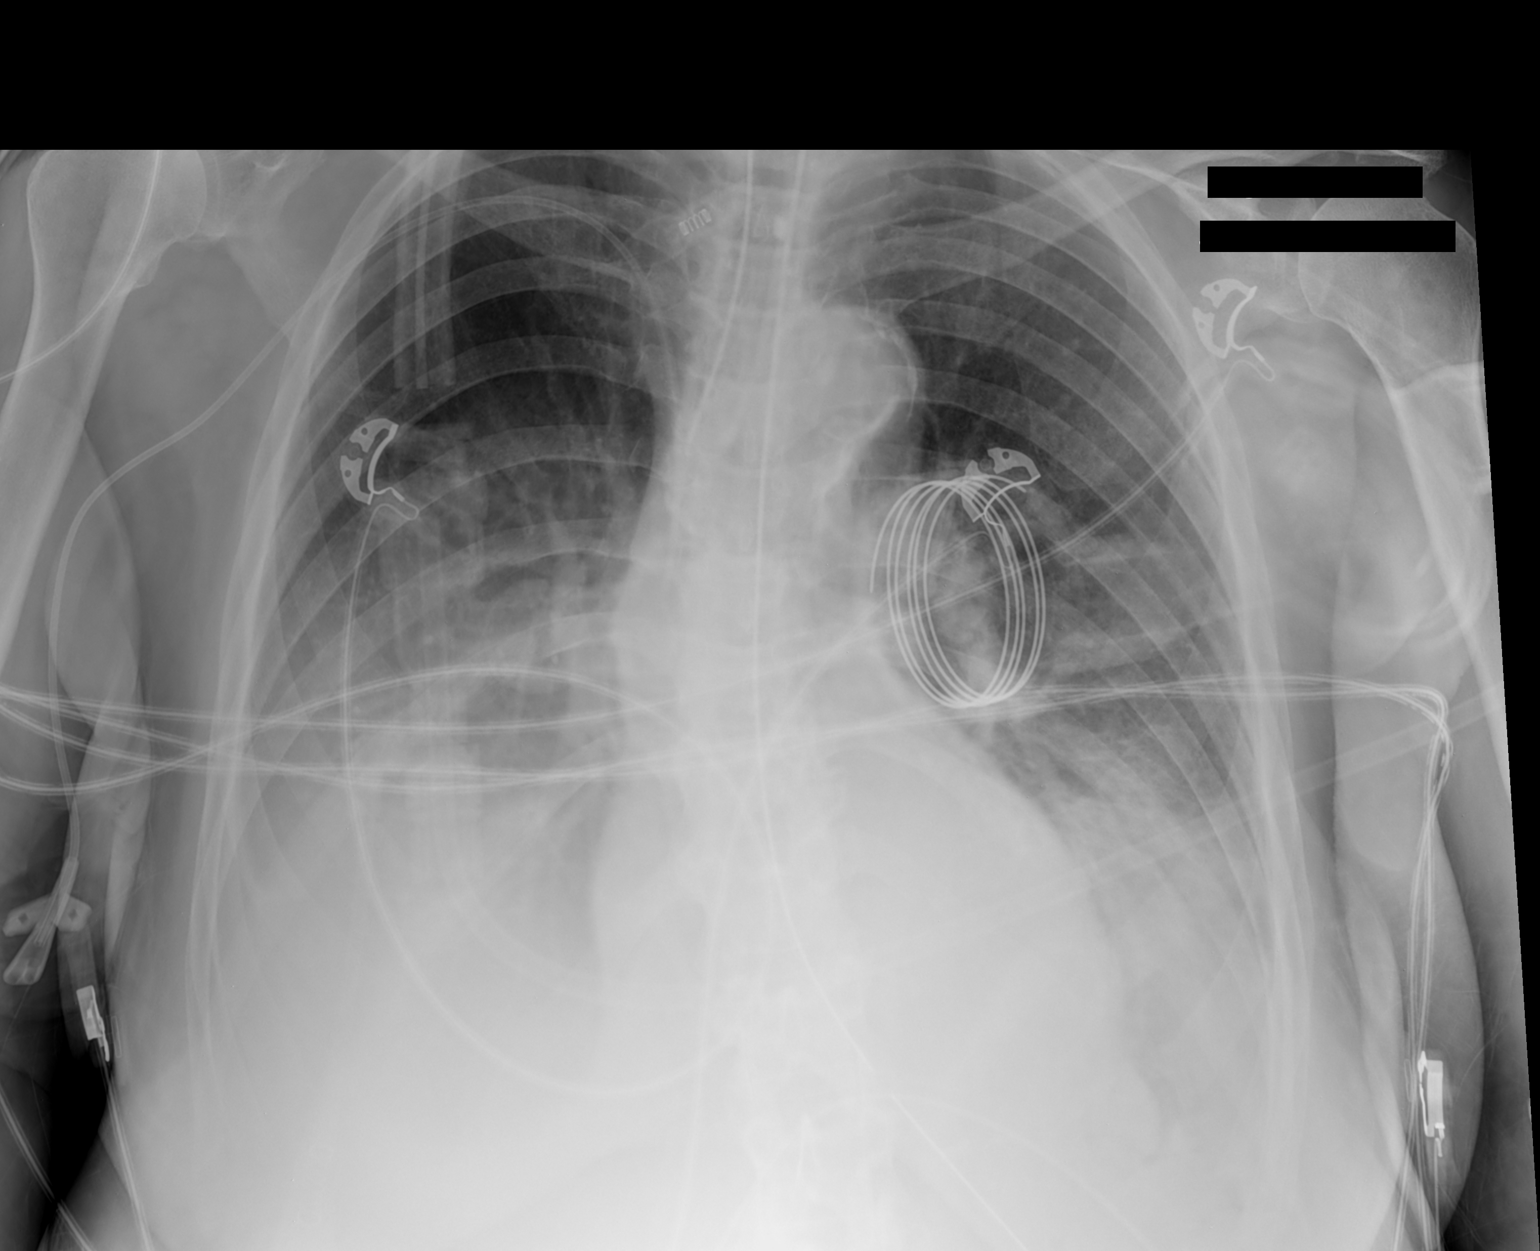

[1 of 1 positions shown; findings below may reference images not displayed]

FINDINGS: Endotracheal tube tip is 25 mm from the carina.  Enteric
tube is present.  The proximal side port is near the
gastroesophageal junction and could be advanced a few centimeters
for better positioning.  This has been retracted compared
yesterday's exam.  Bilateral effusions and basilar atelectasis
remain present.  No interval change in pulmonary aeration aside
from some positional shift in the effusions. Right upper extremity
PICC is unchanged.
IMPRESSION: 1.  Endotracheal tube and right upper extremity PICC unchanged.
2.  Endotracheal tube has been pulled back and could be advanced a
few centimeters.  The proximal side port is at the level of the
gastroesophageal junction.
3.  Positional shift of pleural effusions with basilar atelectasis.
The pulmonary aeration is little changed.

## 2013-06-03 IMAGING — CR DG CHEST 1V PORT
1 series · 1 of 1 positions shown · non-contrast
Comparison: Portable chest x-ray yesterday.

CLINICAL DATA: Repositioning of endotracheal tube.

PORTABLE CHEST - 1 VIEW [DATE]/[id number] hours:

[AP]
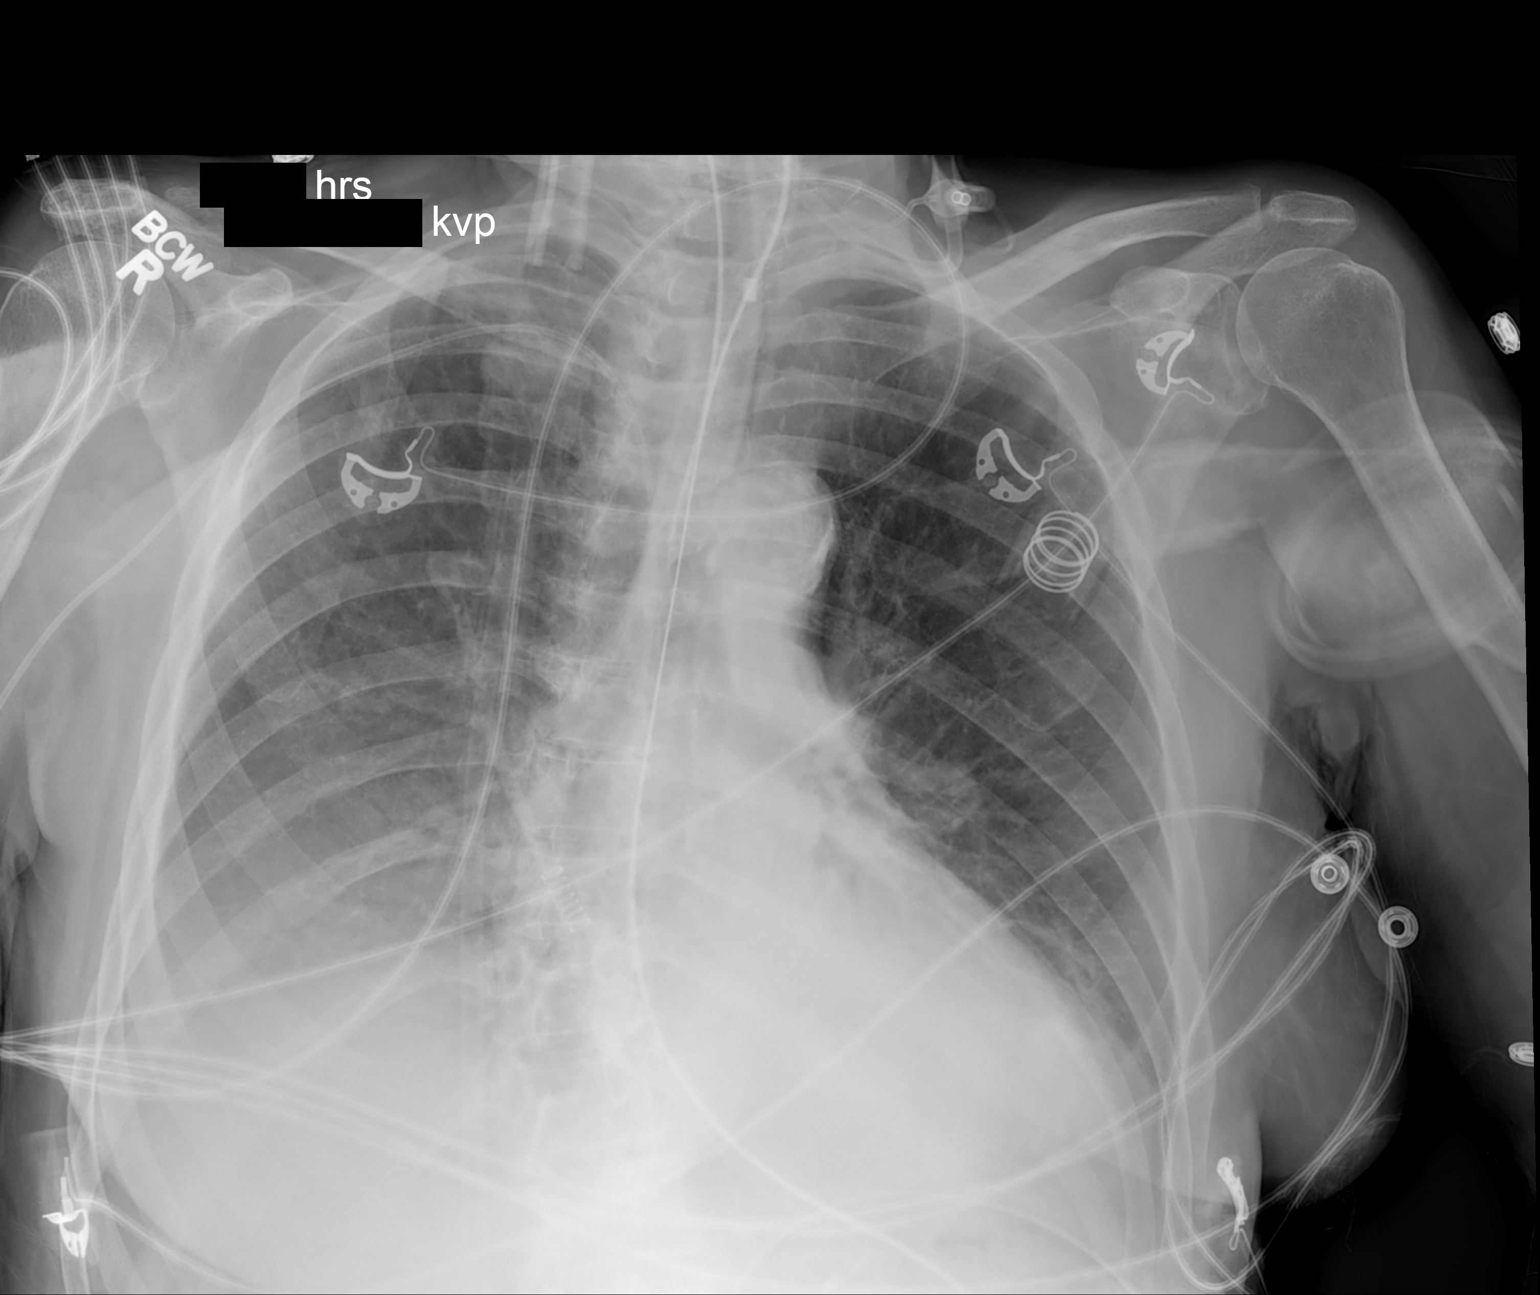

[1 of 1 positions shown; findings below may reference images not displayed]

FINDINGS: Endotracheal tube tip now in satisfactory position
projecting approximately 6 cm above the carina.  Nasogastric tube
courses below the diaphragm into the stomach.  Right arm PICC tip
projects over the mid SVC, unchanged.  Dense consolidation in the
lower lobes and bilateral effusions, right greater than left,
unchanged.  No new pulmonary parenchymal abnormality.  Cardiac
silhouette normal in size for technique.  Pulmonary vascularity
normal.
IMPRESSION: 1.  Endotracheal tube tip now in satisfactory position projecting
approximately 6 cm above the carina.
2.  Remaining support apparatus satisfactory.
3.  Stable bilateral pleural effusions, right greater than left,
and associated dense passive atelectasis and/or pneumonia in the
lower lobes.  No new abnormalities.

## 2013-06-04 IMAGING — CR DG CHEST 1V PORT
1 series · 1 of 1 positions shown · non-contrast
Comparison: 06/15/2012

CLINICAL DATA: Respiratory distress

PORTABLE CHEST - 1 VIEW

[AP]
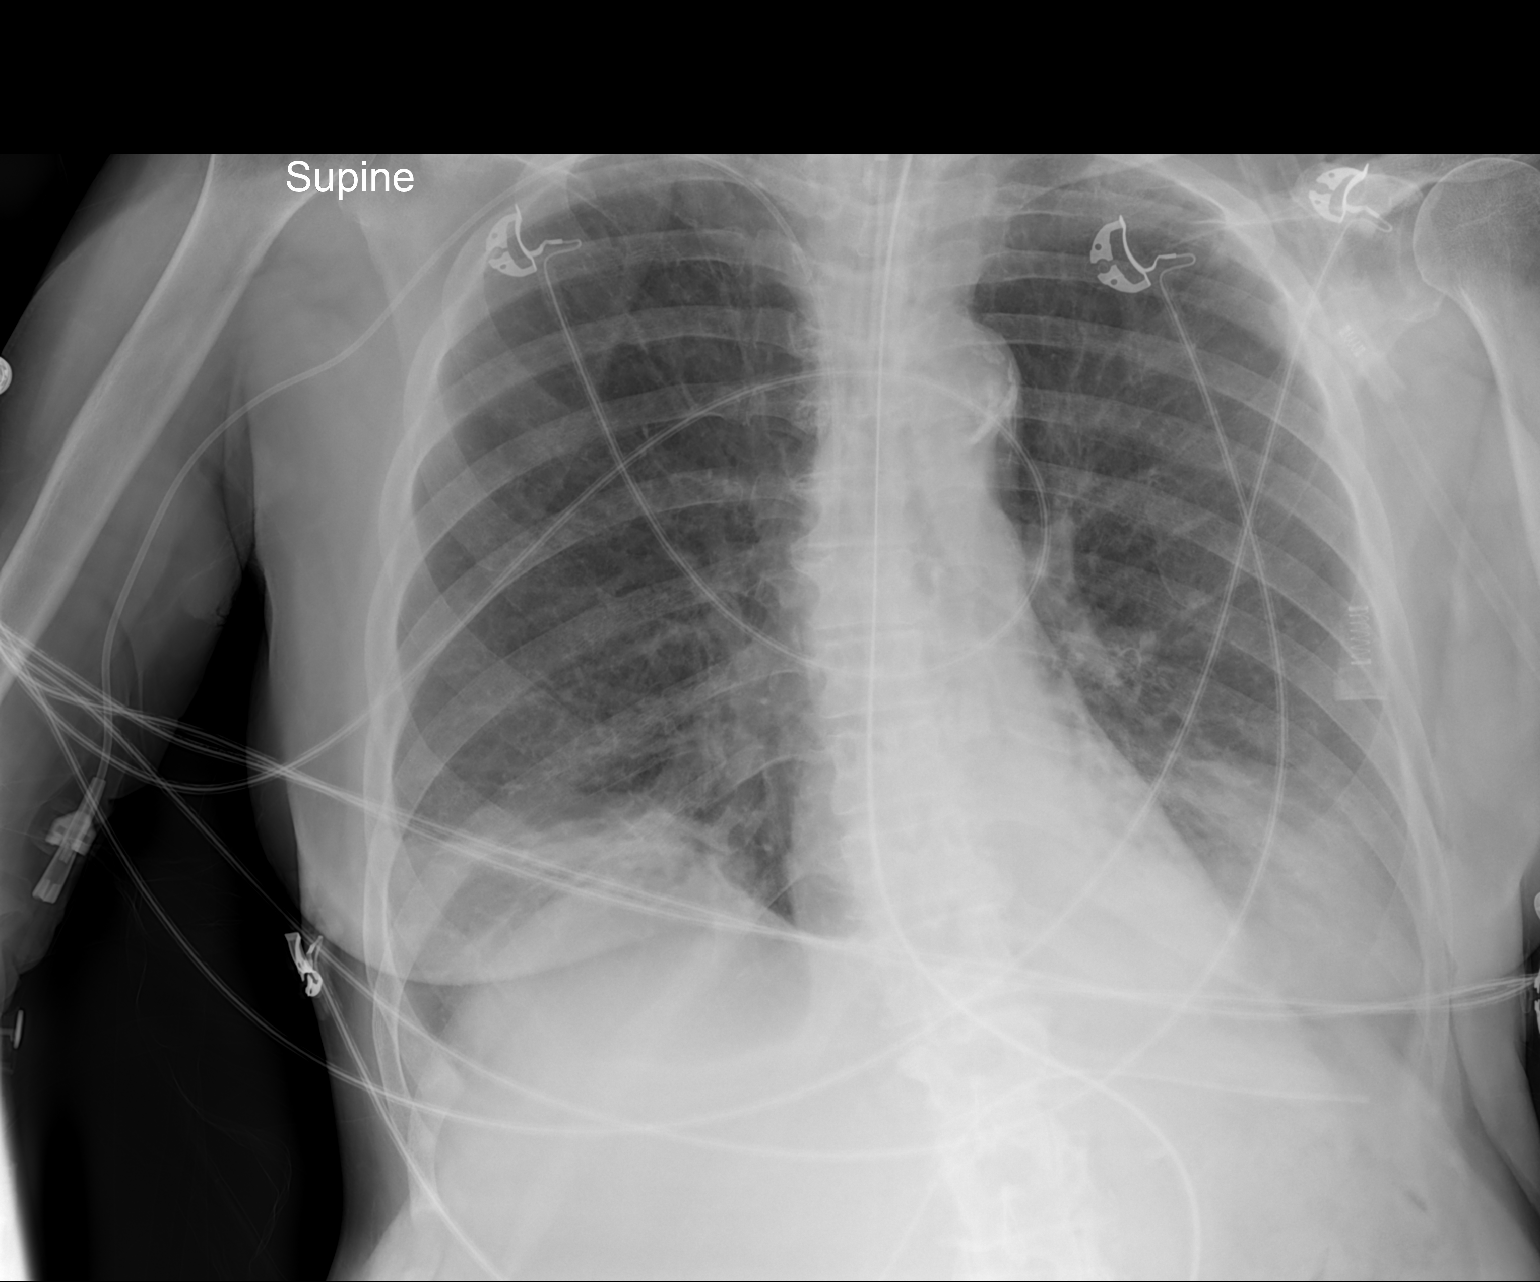

[1 of 1 positions shown; findings below may reference images not displayed]

FINDINGS: Endotracheal tube tip positioned 3.5 cm proximal to the
carina.  Right sided PICC with catheter tip projecting over the
distal SVC.  Aortic arch atherosclerosis.  Bibasilar opacities and
small pleural effusions. Allowing for differences in
positioning/technique similar in size.  No interval osseous change.
Mild thoracolumbar curvature.  NG tube descends into the abdomen,
tip  likely over the gastric fundus.
IMPRESSION: Appropriately positioned support devices as above.

Small bilateral pleural effusions with associated bibasilar
opacities; atelectasis, aspiration, or pneumonia.

## 2013-06-19 ENCOUNTER — Ambulatory Visit (INDEPENDENT_AMBULATORY_CARE_PROVIDER_SITE_OTHER)
Admission: RE | Admit: 2013-06-19 | Discharge: 2013-06-19 | Disposition: A | Discharge status: Home / Self Care | Payer: PRIVATE HEALTH INSURANCE | Source: Ambulatory Visit | Attending: Pulmonary Disease | Admitting: Pulmonary Disease

## 2013-06-19 DIAGNOSIS — R911 Solitary pulmonary nodule: Secondary | ICD-10-CM

## 2013-06-26 ENCOUNTER — Telehealth: Payer: Self-pay | Admitting: Pulmonary Disease

## 2013-06-26 NOTE — Telephone Encounter (Signed)
Ct Chest Wo Contrast  06/19/2013   *RADIOLOGY REPORT*   Clinical Data: Follow-up lung nodule.   CT CHEST WITHOUT CONTRAST   Technique:  Multidetector CT imaging of the chest was performed following the standard protocol without IV contrast.   Comparison: 06/10/2012.   Findings:  No pathologically enlarged mediastinal, hilar or axillary lymph nodes.  Atherosclerotic calcification of the arterial vasculature, including coronary arteries.  Heart size normal.  No pericardial effusion.  Centrilobular emphysema.  There is fairly significant image degradation by respiratory motion.  Previously seen airspace nodule the left upper lobe has resolved in the interval, with possible small focal area of scarring (series 3, image 24).  Scattered bibasilar predominant pulmonary parenchymal scarring.  No pleural fluid.  Airway is otherwise unremarkable.  Incidental imaging of the upper abdomen shows no acute findings. No worrisome lytic or sclerotic lesions.  Degenerative changes are seen in the spine. L1 superior endplate compression fracture appears stable.   IMPRESSION:   1.  Image quality is rather degraded by respiratory motion.  2.  Previously seen left upper lobe nodule has resolved in the interval.    Original Report Authenticated By: Leanna Battles, M.D.   Will have my nurse inform pt that CT chest looks better, and she will need next available ROV to discuss in more detail.

## 2013-06-27 NOTE — Telephone Encounter (Signed)
Called and spoke with someone at [facility] - where the pt is living. I was placed on hold and held for 15 minutes. I will try back on Monday.

## 2013-07-01 NOTE — Telephone Encounter (Signed)
Called [facility] again and I was placed on hold again. I held for 10 minutes without anyone coming to the phone. I will try back later.

## 2013-07-03 ENCOUNTER — Encounter: Payer: Self-pay | Admitting: *Deleted

## 2013-07-03 NOTE — Telephone Encounter (Signed)
I have not been able to get a hold of anyone at [facility]. A letter will be sent to the facility to let them know that we need the pt to be scheduled for a ROV.

## 2013-07-11 ENCOUNTER — Non-Acute Institutional Stay (SKILLED_NURSING_FACILITY): Payer: PRIVATE HEALTH INSURANCE | Admitting: Internal Medicine

## 2013-07-11 ENCOUNTER — Encounter: Payer: Self-pay | Admitting: Internal Medicine

## 2013-07-11 DIAGNOSIS — E119 Type 2 diabetes mellitus without complications: Secondary | ICD-10-CM

## 2013-07-11 DIAGNOSIS — N184 Chronic kidney disease, stage 4 (severe): Secondary | ICD-10-CM

## 2013-07-11 DIAGNOSIS — E210 Primary hyperparathyroidism: Secondary | ICD-10-CM

## 2013-07-11 NOTE — Progress Notes (Signed)
Patient ID: April [name], female   DOB: [birth year], 78 y.o.   MRN: [id number] Location:   [facility] Provider:  Gwenith Spitz. Renato Gails, D.O., C.M.D.   Code Status:  DNR  Chief Complaint  Patient presents with  . Medical Managment of Chronic Issues    HPI:  78 yo white female with h/o perforated ulcer, aspiration, DMII, COPD and primary hyperparathyroidism was seen for medical mgt of her chronic diseases.  She is doing well w/o complaints.  Staff have no new concerns about her.  She is on medical mgt of her primary hyperparathyroidism, and oral agents only for her diabetes.    Review of Systems:  Review of Systems  Constitutional: Negative for fever and chills.  HENT: Negative for congestion.   Eyes: Negative for blurred vision.  Respiratory: Negative for shortness of breath.   Cardiovascular: Negative for chest pain.  Gastrointestinal: Negative for abdominal pain, diarrhea and constipation.  Genitourinary: Negative for dysuria.  Musculoskeletal: Negative for myalgias and falls.  Skin: Negative for rash.  Neurological: Negative for dizziness.  Endo/Heme/Allergies: Does not bruise/bleed easily.  Psychiatric/Behavioral: Negative for depression.    Medications: Patient's Medications  New Prescriptions   No medications on file  Previous Medications   ATORVASTATIN (LIPITOR) 20 MG TABLET    Take 20 mg by mouth daily.     OXYCODONE (OXY-IR) 5 MG CAPSULE    Take 1 to 2 tabs every four hours  as needed for pain.   PANTOPRAZOLE (PROTONIX) 40 MG TABLET    Take 1 tablet (40 mg total) by mouth 2 (two) times daily before a meal.   SENNOSIDES-DOCUSATE SODIUM (SENOKOT-S) 8.6-50 MG TABLET    Take 2 tablets by mouth 2 (two) times daily.   SITAGLIPTAN-METFORMIN (JANUMET) 50-500 MG PER TABLET    Take 1 tablet by mouth 2 (two) times daily with a meal.    Modified Medications   No medications on file  Discontinued Medications   No medications on file    Physical Exam: Filed Vitals:    07/09/13 1623  BP: 127/67  Pulse: 67  Temp: 98.4 F (36.9 C)  Resp: 20  Physical Exam  Constitutional: She appears well-developed and well-nourished. No distress.  Cardiovascular: Normal rate, regular rhythm, normal heart sounds and intact distal pulses.   Pulmonary/Chest: Effort normal and breath sounds normal. No respiratory distress.  Abdominal: Soft. Bowel sounds are normal. She exhibits no distension. There is no tenderness.  Musculoskeletal:  Seated in wheelchair in room  Neurological: She is alert. No cranial nerve deficit.  Skin: Skin is warm and dry.    Labs reviewed: 06/16/13:  iCa 1.2, PTH 637.8, Ca 8.4, Vit D 19  Significant Diagnostic Results:  None since last routine visit  Assessment/Plan 1. DM (diabetes mellitus) -on janumet -not getting regular cbgs at this time -will check hba1c next routine visit -check bmp next draw to reassess renal function--likely should d/c janumet  2. Hyperparathyroidism, primary -check bmp, ica, Vit D to f/u hyperparathyroidism and see how responding to sensipar and vitamin D   3. CRF (chronic renal failure), stage 4 (severe) -f/u bmp--probably need to stop janumet--could put on tradjenta if hba1c indicates need for diabetes medication at all at this point  Family/ staff Communication: discussed with nursing staff  Goals of care: DNR, comfort measures, her family has decided not to pursue surgery for primary hyperparathyroidism due to her dementia  Labs/tests ordered:  Bmp, ica, vit D;  Will need hba1c next time

## 2013-08-08 ENCOUNTER — Non-Acute Institutional Stay (SKILLED_NURSING_FACILITY): Payer: PRIVATE HEALTH INSURANCE | Admitting: Nurse Practitioner

## 2013-08-08 ENCOUNTER — Encounter: Payer: Self-pay | Admitting: Nurse Practitioner

## 2013-08-08 DIAGNOSIS — I10 Essential (primary) hypertension: Secondary | ICD-10-CM

## 2013-08-08 DIAGNOSIS — E210 Primary hyperparathyroidism: Secondary | ICD-10-CM

## 2013-08-08 DIAGNOSIS — N189 Chronic kidney disease, unspecified: Secondary | ICD-10-CM

## 2013-08-08 DIAGNOSIS — K5900 Constipation, unspecified: Secondary | ICD-10-CM

## 2013-08-08 DIAGNOSIS — E119 Type 2 diabetes mellitus without complications: Secondary | ICD-10-CM

## 2013-08-08 NOTE — Progress Notes (Signed)
Patient ID: April [name], female   DOB: [birth year], 78 y.o.   MRN: [id number]   PCP: Bufford Spikes, DO   Allergies  Allergen Reactions  . Nsaids     Pt had perforated ulcer - patched [id number]    Chief Complaint  Patient presents with  . Medical Managment of Chronic Issues    HPI:  April [name] is a 78 female with a PMH of HTN, Hyperlipidemia, benign tumor of pituitary gland, hyperparathyroidism, renal insuffiencey and constipation is being seen for the management of her chronic illnesses; she is doing well; there are no concerns by the staff and the patient has no concerns.  Review of Systems:  Review of Systems  Constitutional: Negative for fever, chills and weight loss.  HENT: Negative for congestion and sore throat.  Respiratory: Negative for cough and shortness of breath.  Cardiovascular: Negative for chest pain, palpitations and leg swelling.  Gastrointestinal: Positive for constipation. Negative for abdominal pain and constipation.  Genitourinary: Negative for dysuria, urgency and frequency.  Musculoskeletal: Positive for joint pain (knee pain). Negative for myalgias.  Skin: Negative for itching and rash.  Neurological: Negative for dizziness, weakness and headaches.  Psychiatric/Behavioral: Negative for depression. The patient is not nervous/anxious and does not have insomnia.    Past Medical History  Diagnosis Date  . Diabetes mellitus   . Hyperthyroidism   . Hypertension   . Perforated ulcer    Past Surgical History  Procedure Laterality Date  . 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 Laparotomy  06/10/2012    Procedure: Omental patch of perforated ulcer, EXPLORATORY LAPAROTOMY;  Surgeon: Adolph Pollack, MD;  Location: [facility]S;  Service: General;  Laterality: N/A;  closure of perforated gastric ulcer   Social History:   reports that she quit smoking about 13 months ago. Her smoking use included Cigarettes. She has a 52 pack-year smoking history. She has never  used smokeless tobacco. She reports that she does not drink alcohol or use illicit drugs.  No family history on file.  Medications: Patient's Medications  New Prescriptions   No medications on file  Previous Medications   ATORVASTATIN (LIPITOR) 20 MG TABLET    Take 20 mg by mouth daily.     OXYCODONE (OXY-IR) 5 MG CAPSULE    Take 1 to 2 tabs every four hours  as needed for pain.   PANTOPRAZOLE (PROTONIX) 40 MG TABLET    Take 1 tablet (40 mg total) by mouth 2 (two) times daily before a meal.   SENNOSIDES-DOCUSATE SODIUM (SENOKOT-S) 8.6-50 MG TABLET    Take 2 tablets by mouth 2 (two) times daily.   SITAGLIPTAN-METFORMIN (JANUMET) 50-500 MG PER TABLET    Take 1 tablet by mouth 2 (two) times daily with a meal.    Modified Medications   No medications on file  Discontinued Medications   No medications on file     Physical Exam:  Filed Vitals:   08/08/13 1210  BP: 134/70  Pulse: 72  Temp: 97.2 F (36.2 C)  Resp: 18    Constitutional: She is well-developed, well-nourished, and in no distress. No distress.  HEENT: unremarkable  Cardiovascular: Normal rate, regular rhythm and normal heart sounds.  Pulmonary/Chest: Effort normal and breath sounds normal.  Abdominal: Soft. Bowel sounds are normal. She exhibits no distension.  Musculoskeletal: She exhibits no edema and no tenderness.  Self propels in wc  Neurological: She is alert.  Skin: Skin is warm and dry.  She is not diaphoretic.  Psychiatric: Affect normal.    Labs reviewed:  02/05/13: A1c 6.1  Cholesterol 99, triglyceride 69, hdl 34, LDL 51  Wbc 7.1, rbc 3.81, hgb 11.0, hct 33.3  Sodium 145, potassium 4.5, glucose 89, BUN 31, Cr 2.22  ASL 9, ALT 8 03/26/13: sodium 141, potassium 4.2, glucose 123, BUN 23, CR 2.57  06/16/13: ionized calcium 1.2, parathyroid 638.8, calcium 8.4, vit d 19 07/16/13: sodium 143, potassium 4.3, glucose 108, BUN 28, Cr 2.51 Ionized Calcium 1.14 VId D 35 Assessment/Plan 1. HTN  (hypertension) Patient is stable; continue current regimen. Will monitor and make changes as necessary.  2. Unspecified constipation Worse; will add miralax 17gm; hold for diarrhea  3. DM (diabetes mellitus) Will get A1c and stop [place]; will have staff take blood sugars q am; if medication needed tradjenda may need to be added   4. Hyperparathyroidism, primary calicum and vit d levels stable; will cont current plan of care  5. CRF (chronic renal failure) Stable at this time

## 2013-09-29 ENCOUNTER — Non-Acute Institutional Stay (SKILLED_NURSING_FACILITY): Payer: PRIVATE HEALTH INSURANCE | Admitting: Nurse Practitioner

## 2013-09-29 ENCOUNTER — Encounter: Payer: Self-pay | Admitting: Nurse Practitioner

## 2013-09-29 DIAGNOSIS — I10 Essential (primary) hypertension: Secondary | ICD-10-CM

## 2013-09-29 DIAGNOSIS — K5900 Constipation, unspecified: Secondary | ICD-10-CM

## 2013-09-29 DIAGNOSIS — E119 Type 2 diabetes mellitus without complications: Secondary | ICD-10-CM

## 2013-09-29 DIAGNOSIS — N184 Chronic kidney disease, stage 4 (severe): Secondary | ICD-10-CM

## 2013-09-29 MED ORDER — SENNA 8.6 MG PO TABS: 1.0000 | ORAL_TABLET | Freq: Two times a day (BID) | ORAL | Status: DC

## 2013-09-29 NOTE — Progress Notes (Signed)
Patient ID: April [name], female   DOB: August 15, 1935, 78 y.o.   MRN: [id number]    Nursing Home Location:  [facility]   Place of Service: SNF (31)  PCP: REED, TIFFANY, DO  Allergies  Allergen Reactions  . Nsaids     Pt had perforated ulcer - patched [id number]    Chief Complaint  Patient presents with  . Medical Managment of Chronic Issues    HPI:  April [name] is a 78 female with a PMH of HTN, Hyperlipidemia, benign tumor of pituitary gland, hyperparathyroidism, renal insuffiencey and constipation is being seen for the management of her chronic illnesses; she is doing well; there are no concerns by the staff; Patient reports she has not had a BM in several days; miralax is not helping otherwise she has no concerns.  Review of Systems:  Review of Systems  Constitutional: Negative for fever, chills and malaise/fatigue.  HENT: Negative for congestion.   Eyes: Negative for blurred vision.  Respiratory: Negative for shortness of breath.   Cardiovascular: Negative for chest pain and leg swelling.  Gastrointestinal: Positive for constipation. Negative for heartburn, nausea, vomiting, abdominal pain and diarrhea.  Genitourinary: Negative for dysuria.  Musculoskeletal: Negative for falls and myalgias.  Skin: Negative for rash.  Neurological: Negative for dizziness and weakness.  Endo/Heme/Allergies: Does not bruise/bleed easily.  Psychiatric/Behavioral: Negative for depression.     Past Medical History  Diagnosis Date  . Diabetes mellitus   . Hyperthyroidism   . Hypertension   . Perforated ulcer    Past Surgical History  Procedure Laterality Date  . 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 Laparotomy  06/10/2012    Procedure: Omental patch of perforated ulcer, EXPLORATORY LAPAROTOMY;  Surgeon: Adolph Pollack, MD;  Location: [facility];  Service: General;  Laterality: N/A;  closure of perforated gastric ulcer   Social History:   reports that she quit smoking  about 15 months ago. Her smoking use included Cigarettes. She has a 52 pack-year smoking history. She has never used smokeless tobacco. She reports that she does not drink alcohol or use illicit drugs.  No family history on file.  Medications: Patient's Medications  New Prescriptions   SENNA (SENOKOT) 8.6 MG TABS TABLET    Take 1 tablet (8.6 mg total) by mouth 2 (two) times daily.  Previous Medications   ATORVASTATIN (LIPITOR) 20 MG TABLET    Take 20 mg by mouth daily.     CHOLECALCIFEROL (VITAMIN D) 1000 UNITS TABLET    Take 2,000 Units by mouth daily.   CINACALCET (SENSIPAR) 30 MG TABLET    Take 30 mg by mouth 2 (two) times daily with a meal.   OXYCODONE (OXY-IR) 5 MG CAPSULE    Take 1 to 2 tabs every four hours  as needed for pain.   PANTOPRAZOLE (PROTONIX) 40 MG TABLET    Take 1 tablet (40 mg total) by mouth 2 (two) times daily before a meal.   POLYETHYLENE GLYCOL (MIRALAX / GLYCOLAX) PACKET    Take 17 g by mouth daily.   Modified Medications   No medications on file  Discontinued Medications   SENNOSIDES-DOCUSATE SODIUM (SENOKOT-S) 8.6-50 MG TABLET    Take 2 tablets by mouth 2 (two) times daily.   SITAGLIPTAN-METFORMIN (JANUMET) 50-500 MG PER TABLET    Take 1 tablet by mouth 2 (two) times daily with a meal.    SITAGLIPTIN (JANUVIA) 25 MG TABLET    Take 25  mg by mouth daily.     Physical Exam: Constitutional: She is well-developed, well-nourished, and in no distress. No distress.  HEENT: unremarkable  Cardiovascular: Normal rate, regular rhythm and normal heart sounds.  Pulmonary/Chest: Effort normal and breath sounds normal.  Abdominal: Soft. Bowel sounds are normal. She exhibits no tenderness or distension.  Musculoskeletal: She exhibits no edema and no tenderness.  Self propels in wc  Neurological: She is alert.  Skin: Skin is warm and dry. She is not diaphoretic.  Psychiatric: Affect normal.   Filed Vitals:   09/29/13 0958  BP: 137/72  Pulse: 73  Temp: 97.6 F (36.4  C)  Resp: 20    Labs reviewed: 02/05/13: A1c 6.1  Cholesterol 99, triglyceride 69, hdl 34, LDL 51  Wbc 7.1, rbc 3.81, hgb 11.0, hct 33.3  Sodium 145, potassium 4.5, glucose 89, BUN 31, Cr 2.22  ASL 9, ALT 8  03/26/13: sodium 141, potassium 4.2, glucose 123, BUN 23, CR 2.57  06/16/13: ionized calcium 1.2, parathyroid 638.8, calcium 8.4, vit d 19  07/16/13: sodium 143, potassium 4.3, glucose 108, BUN 28, Cr 2.51  Ionized Calcium 1.14  VId D 35  Assessment/Plan 1. HTN (hypertension) Patient is stable; continue current regimen. Will monitor and make changes as necessary.  2. DM (diabetes mellitus) A1c has still not been drawn (or is not in chart) CBGs reviewed; blood sugars ranging from 90's to 200s; will not restart medication at this time but cont to monitor blood sugars are request for A1c   3. CRF (chronic renal failure) -stable will follow up bmp next month  4. Unspecified constipation Remains constipation on miralax; will start senokot-S BID

## 2013-11-05 ENCOUNTER — Non-Acute Institutional Stay (SKILLED_NURSING_FACILITY): Payer: PRIVATE HEALTH INSURANCE | Admitting: Nurse Practitioner

## 2013-11-05 DIAGNOSIS — I10 Essential (primary) hypertension: Secondary | ICD-10-CM

## 2013-11-05 DIAGNOSIS — E559 Vitamin D deficiency, unspecified: Secondary | ICD-10-CM

## 2013-11-05 DIAGNOSIS — N184 Chronic kidney disease, stage 4 (severe): Secondary | ICD-10-CM

## 2013-11-05 DIAGNOSIS — E119 Type 2 diabetes mellitus without complications: Secondary | ICD-10-CM

## 2013-11-05 DIAGNOSIS — E210 Primary hyperparathyroidism: Secondary | ICD-10-CM

## 2013-11-05 DIAGNOSIS — E785 Hyperlipidemia, unspecified: Secondary | ICD-10-CM

## 2013-11-05 DIAGNOSIS — K5900 Constipation, unspecified: Secondary | ICD-10-CM

## 2013-11-05 NOTE — Progress Notes (Signed)
Patient ID: April [name], female   DOB: October 06, 1935, 78 y.o.   MRN: [id number]    Nursing Home Location:  [facility]   Place of Service: SNF (31)  PCP: REED, TIFFANY, DO  Allergies  Allergen Reactions  . Nsaids     Pt had perforated ulcer - patched [id number]    Chief Complaint  Patient presents with  . Medical Managment of Chronic Issues    HPI:  April [name] is a 78 female with a PMH of HTN, Hyperlipidemia, benign tumor of pituitary gland, hyperparathyroidism, renal insuffiencey and constipation is being seen for the management of her chronic conditions; she is doing well and there are no concerns by the pt or the staff; pt reports constipation has improved   Review of Systems:  Review of Systems  Constitutional: Negative for fever, chills and malaise/fatigue.  HENT: Negative for congestion.   Eyes: Negative for blurred vision.  Respiratory: Negative for shortness of breath.   Cardiovascular: Negative for chest pain and leg swelling.  Gastrointestinal: Positive for constipation (improved). Negative for heartburn, nausea, vomiting, abdominal pain and diarrhea.  Genitourinary: Negative for dysuria.  Musculoskeletal: Negative for falls and myalgias.  Skin: Negative for rash.  Neurological: Negative for dizziness and weakness.  Endo/Heme/Allergies: Does not bruise/bleed easily.  Psychiatric/Behavioral: Negative for depression. The patient is not nervous/anxious and does not have insomnia.      Past Medical History  Diagnosis Date  . Diabetes mellitus   . Hyperthyroidism   . Hypertension   . Perforated ulcer    Past Surgical History  Procedure Laterality Date  . 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 Laparotomy  06/10/2012    Procedure: Omental patch of perforated ulcer, EXPLORATORY LAPAROTOMY;  Surgeon: Adolph Pollack, MD;  Location: [facility];  Service: General;  Laterality: N/A;  closure of perforated gastric ulcer   Social History:   reports that  she quit smoking about 16 months ago. Her smoking use included Cigarettes. She has a 52 pack-year smoking history. She has never used smokeless tobacco. She reports that she does not drink alcohol or use illicit drugs.  No family history on file.  Medications: Patient's Medications  New Prescriptions   No medications on file  Previous Medications   ATORVASTATIN (LIPITOR) 20 MG TABLET    Take 20 mg by mouth daily.     CHOLECALCIFEROL (VITAMIN D) 1000 UNITS TABLET    Take 2,000 Units by mouth daily.   CINACALCET (SENSIPAR) 30 MG TABLET    Take 30 mg by mouth 2 (two) times daily with a meal.   OXYCODONE (OXY-IR) 5 MG CAPSULE    Take 1 to 2 tabs every four hours  as needed for pain.   PANTOPRAZOLE (PROTONIX) 40 MG TABLET    Take 1 tablet (40 mg total) by mouth 2 (two) times daily before a meal.   POLYETHYLENE GLYCOL (MIRALAX / GLYCOLAX) PACKET    Take 17 g by mouth daily.    SENNA (SENOKOT) 8.6 MG TABS TABLET    Take 1 tablet (8.6 mg total) by mouth 2 (two) times daily.  Modified Medications   No medications on file  Discontinued Medications   No medications on file     Physical Exam: Physical Exam  Constitutional: She appears well-developed and well-nourished. No distress.  HENT:  Head: Normocephalic and atraumatic.  Right Ear: External ear normal.  Left Ear: External ear normal.  Nose: Nose normal.  Mouth/Throat:  Oropharynx is clear and moist.  Eyes: EOM are normal. Pupils are equal, round, and reactive to light.  Neck: Normal range of motion. Neck supple. No thyromegaly present.  Cardiovascular: Normal rate, regular rhythm, normal heart sounds and intact distal pulses.   Pulmonary/Chest: Effort normal and breath sounds normal. No respiratory distress.  Abdominal: Soft. Bowel sounds are normal. She exhibits no distension. There is no tenderness.  Musculoskeletal: Normal range of motion. She exhibits no edema and no tenderness.  Lymphadenopathy:    She has no cervical adenopathy.    Neurological: She is alert. No cranial nerve deficit.  Skin: Skin is warm and dry.  Psychiatric: She has a normal mood and affect.    Filed Vitals:   11/05/13 1004  BP: 138/72  Pulse: 68  Temp: 98.2 F (36.8 C)  Resp: 20  Weight: 148 lb (67.132 kg)      Labs reviewed: 02/05/13: A1c 6.1  Cholesterol 99, triglyceride 69, hdl 34, LDL 51  Wbc 7.1, rbc 3.81, hgb 11.0, hct 33.3  Sodium 145, potassium 4.5, glucose 89, BUN 31, Cr 2.22  ASL 9, ALT 8  03/26/13: sodium 141, potassium 4.2, glucose 123, BUN 23, CR 2.57  06/16/13: ionized calcium 1.2, parathyroid 638.8, calcium 8.4, vit d 19  07/16/13: sodium 143, potassium 4.3, glucose 108, BUN 28, Cr 2.51  Ionized Calcium 1.14  VId D 35 09/13/13 calcium, ion 4.9 Assessment/Plan 1. Unspecified constipation -improved with increase in senna -increase water intake -cont senna and miralax  2. HTN (hypertension) -stable at this time; will follow up CMP  3. DM (diabetes mellitus) -off all medications, blood sugars reviewed and in appropriate range for age ~78 -will dc cbgs at this time and check A1c  4. Hyperparathyroidism, primary -stable; ion calcium WNL, will cont to follow  5. Chronic kidney disease (CKD), stage IV (severe) -will follow up kidney function at this time  6. Hyperlipidemia -conts on Lipitor -will check liver funtion  7. Unspecified vitamin D deficiency -on vit D 2000 units daily -will follow up vit d level

## 2013-12-10 ENCOUNTER — Non-Acute Institutional Stay (SKILLED_NURSING_FACILITY): Payer: PRIVATE HEALTH INSURANCE | Admitting: Internal Medicine

## 2013-12-10 DIAGNOSIS — N184 Chronic kidney disease, stage 4 (severe): Secondary | ICD-10-CM

## 2013-12-10 DIAGNOSIS — I10 Essential (primary) hypertension: Secondary | ICD-10-CM

## 2013-12-10 DIAGNOSIS — E785 Hyperlipidemia, unspecified: Secondary | ICD-10-CM

## 2013-12-10 DIAGNOSIS — E210 Primary hyperparathyroidism: Secondary | ICD-10-CM

## 2013-12-10 DIAGNOSIS — R911 Solitary pulmonary nodule: Secondary | ICD-10-CM

## 2013-12-10 NOTE — Progress Notes (Signed)
Patient ID: April [name], female   DOB: 12/11/1934, 79 y.o.   MRN: [id number]  Location:  [facility] Provider:  Rexene Edison. Mariea Clonts, D.O., C.M.D.  Code Status:  Full code  Chief Complaint  Patient presents with  . Medical Management of Chronic Issues   HPI:  79 yo female here initially for acute rehab after perforated gastric ulcer, has benign tumor of pituitary gland, DMII, colon polyp, pulmonary nodule chronic renal failure, and primary hyperparathyroidism was seen for medical mgt of chronic diseases.    Review of Systems:  Review of Systems  Constitutional: Negative for fever.  HENT: Negative for congestion.   Eyes: Negative for blurred vision.  Respiratory: Negative for shortness of breath.   Cardiovascular: Negative for chest pain and leg swelling.  Gastrointestinal: Negative for abdominal pain.  Genitourinary: Negative for dysuria.  Musculoskeletal: Negative for falls and myalgias.  Skin: Negative for rash.  Neurological: Negative for dizziness.  Psychiatric/Behavioral: Negative for memory loss.    Medications: Patient's Medications  New Prescriptions   No medications on file  Previous Medications   ATORVASTATIN (LIPITOR) 20 MG TABLET    Take 20 mg by mouth daily.     CHOLECALCIFEROL (VITAMIN D) 1000 UNITS TABLET    Take 2,000 Units by mouth daily.   CINACALCET (SENSIPAR) 30 MG TABLET    Take 30 mg by mouth 2 (two) times daily with a meal.   POLYETHYLENE GLYCOL (MIRALAX / GLYCOLAX) PACKET    Take 17 g by mouth daily.   Modified Medications   Modified Medication Previous Medication   ALBUTEROL (PROVENTIL HFA;VENTOLIN HFA) 108 (90 BASE) MCG/ACT INHALER albuterol (PROAIR HFA) 108 (90 BASE) MCG/ACT inhaler      Inhale 1 puff into the lungs every 6 (six) hours as needed for wheezing or shortness of breath (For Cough).    Inhale 2 puffs into the lungs every 6 (six) hours as needed for wheezing or shortness of breath.   PANTOPRAZOLE (PROTONIX) 40 MG TABLET  pantoprazole (PROTONIX) 40 MG tablet      Take 40 mg by mouth daily.     Take 1 tablet (40 mg total) by mouth 2 (two) times daily before a meal.  Discontinued Medications   OXYCODONE (OXY-IR) 5 MG CAPSULE    Take 1 to 2 tabs every four hours  as needed for pain.   SENNA (SENOKOT) 8.6 MG TABS TABLET    Take 1 tablet (8.6 mg total) by mouth 2 (two) times daily.    Physical Exam: Filed Vitals:   12/10/13 0824  BP: 128/72  Pulse: 80  Temp: 99 F (37.2 C)  Resp: 20  Height: 5\' 2"  (1.575 m)  Weight: 148 lb (67.132 kg)  SpO2: 98%   Physical Exam  Constitutional: She is oriented to person, place, and time. She appears well-developed and well-nourished. No distress.  Cardiovascular: Normal rate, regular rhythm, normal heart sounds and intact distal pulses.   Pulmonary/Chest: Effort normal and breath sounds normal. No respiratory distress.  Abdominal: Soft. Bowel sounds are normal. She exhibits no distension and no mass. There is no tenderness.  Musculoskeletal: Normal range of motion.  Uses wheelchair to get around facility  Neurological: She is alert and oriented to person, place, and time.  Skin: Skin is warm and dry.  Psychiatric: She has a normal mood and affect.     Labs reviewed: 11/07/13: glucose 111, Ca 8.1, BUN 24, cr 2.13, Na 142, K 4.2, Cl 109, alb 3, hba1c 6.4, Vit  D 36.7, wbc 8.5, h/h 10.9/34.5, iCa 4.9  Significant Diagnostic Results:  CT chest 8/14:  Resolution of pulmonary nodule  Assessment/Plan 1. Chronic kidney disease (CKD), stage IV (severe) - cont to avoid nsaids due to this and prior ulcer with rupture  2. Hyperparathyroidism, primary -cont sensipar, prior workup suggested primary rather than secondary etiology, cont vit D  3. Hyperlipidemia -cont statin and check flp  4. Solitary pulmonary nodule -resolved on CT chest -f/u with dr. Halford Chessman as planned  5. Essential hypertension -bp at goal  F/u with Dr. Halford Chessman in pulmonary per his notes  Goals of care:  full code  Labs/tests ordered:  Lipids next draw

## 2013-12-11 LAB — LIPID PANEL
Cholesterol: 93 mg/dL (ref 0–200)
HDL: 24 mg/dL — AB (ref 35–70)
LDL CALC: 46 mg/dL
Triglycerides: 113 mg/dL (ref 40–160)

## 2014-01-30 ENCOUNTER — Non-Acute Institutional Stay (SKILLED_NURSING_FACILITY): Payer: PRIVATE HEALTH INSURANCE | Admitting: Nurse Practitioner

## 2014-01-30 DIAGNOSIS — R911 Solitary pulmonary nodule: Secondary | ICD-10-CM

## 2014-01-30 DIAGNOSIS — I10 Essential (primary) hypertension: Secondary | ICD-10-CM

## 2014-01-30 DIAGNOSIS — E119 Type 2 diabetes mellitus without complications: Secondary | ICD-10-CM

## 2014-01-30 DIAGNOSIS — N184 Chronic kidney disease, stage 4 (severe): Secondary | ICD-10-CM

## 2014-01-30 DIAGNOSIS — E785 Hyperlipidemia, unspecified: Secondary | ICD-10-CM

## 2014-01-30 DIAGNOSIS — K5900 Constipation, unspecified: Secondary | ICD-10-CM

## 2014-01-30 NOTE — Progress Notes (Signed)
Patient ID: April [name], female   DOB: May 14, 1935, 79 y.o.   MRN: [id number]    Nursing Home Location:  [facility] [place]   Place of Service: SNF (31)  PCP: REED, TIFFANY, DO  Allergies  Allergen Reactions  . Nsaids     Pt had perforated ulcer - patched [id number]    Chief Complaint  Patient presents with  . Medical Managment of Chronic Issues    HPI: April [name] is a 83 female with a PMH of HTN, Hyperlipidemia, benign tumor of pituitary gland, hyperparathyroidism, renal insuffiencey and constipation is being seen for the management of her chronic conditions; she is doing well and there are no concerns by the pt or the staff  Review of Systems:  Review of Systems  Constitutional: Negative for fever, chills and malaise/fatigue.  HENT: Negative for congestion.   Eyes: Negative for blurred vision.  Respiratory: Negative for cough, sputum production and shortness of breath.   Cardiovascular: Negative for chest pain and leg swelling.  Gastrointestinal: Positive for constipation (improved). Negative for heartburn, nausea, vomiting, abdominal pain and diarrhea.  Genitourinary: Negative for dysuria.  Musculoskeletal: Negative for falls and myalgias.  Skin: Negative for rash.  Neurological: Negative for dizziness and weakness.  Endo/Heme/Allergies: Does not bruise/bleed easily.  Psychiatric/Behavioral: Negative for depression. The patient is not nervous/anxious and does not have insomnia.      Past Medical History  Diagnosis Date  . Diabetes mellitus   . Hyperthyroidism   . Hypertension   . Perforated ulcer    Past Surgical History  Procedure Laterality Date  . 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 Laparotomy  06/10/2012    Procedure: Omental patch of perforated ulcer, EXPLORATORY LAPAROTOMY;  Surgeon: Odis Hollingshead, MD;  Location: [facility];  Service: General;  Laterality: N/A;  closure of perforated gastric ulcer   Social History:   reports that she quit  smoking about 19 months ago. Her smoking use included Cigarettes. She has a 52 pack-year smoking history. She has never used smokeless tobacco. She reports that she does not drink alcohol or use illicit drugs.  No family history on file.  Medications: Patient's Medications  New Prescriptions   No medications on file  Previous Medications   ATORVASTATIN (LIPITOR) 20 MG TABLET    Take 20 mg by mouth daily.     CHOLECALCIFEROL (VITAMIN D) 1000 UNITS TABLET    Take 2,000 Units by mouth daily.   CINACALCET (SENSIPAR) 30 MG TABLET    Take 30 mg by mouth 2 (two) times daily with a meal.   OXYCODONE (OXY-IR) 5 MG CAPSULE    Take 1 to 2 tabs every four hours  as needed for pain.   POLYETHYLENE GLYCOL (MIRALAX / GLYCOLAX) PACKET    Take 17 g by mouth daily.    SENNA (SENOKOT) 8.6 MG TABS TABLET    Take 1 tablet (8.6 mg total) by mouth 2 (two) times daily.  Modified Medications   Modified Medication Previous Medication   PANTOPRAZOLE (PROTONIX) 40 MG TABLET pantoprazole (PROTONIX) 40 MG tablet      Take 40 mg by mouth 2 (two) times daily before a meal.    Take 1 tablet (40 mg total) by mouth 2 (two) times daily before a meal.  Discontinued Medications   No medications on file     Physical Exam:  Filed Vitals:   01/30/14 1151  BP: 142/74  Pulse: 60  Temp: 97.7 F (  36.5 C)  Resp: 20   Physical Exam  Constitutional: She is well-developed, well-nourished, and in no distress. No distress.  HENT:  Head: Normocephalic and atraumatic.  Mouth/Throat: Oropharynx is clear and moist. No oropharyngeal exudate.  Eyes: Conjunctivae and EOM are normal. Pupils are equal, round, and reactive to light.  Neck: Normal range of motion. Neck supple.  Cardiovascular: Normal rate, regular rhythm and normal heart sounds.   Pulmonary/Chest: Effort normal and breath sounds normal.  Abdominal: Soft. Bowel sounds are normal. She exhibits no distension.  Musculoskeletal: She exhibits no edema and no tenderness.    Self propels in wc  Neurological: She is alert.  Skin: Skin is warm and dry. She is not diaphoretic.  Psychiatric: Affect normal.      Labs reviewed: 02/05/13: A1c 6.1  Cholesterol 99, triglyceride 69, hdl 34, LDL 51  Wbc 7.1, rbc 3.81, hgb 11.0, hct 33.3  Sodium 145, potassium 4.5, glucose 89, BUN 31, Cr 2.22  ASL 9, ALT 8  03/26/13: sodium 141, potassium 4.2, glucose 123, BUN 23, CR 2.57  06/16/13: ionized calcium 1.2, parathyroid 638.8, calcium 8.4, vit d 19  07/16/13: sodium 143, potassium 4.3, glucose 108, BUN 28, Cr 2.51  Ionized Calcium 1.14  VId D 35  09/13/13 calcium, ion 4.9 11/07/13 wbc 8.5, hgb 10.9, hct 34.5, plts 208 A1c 6.4 Vit D 36 Glucose 111, BUN 24, Cr 2.13, sodium 142, potassium 4.2 12/11/13 CHOL 93, trig 113, HDL 24, LDL 46 Assessment/Plan 1. DM (diabetes mellitus) -not currently on medications, diet controlled, A1c has been stable; will dc am cbgs  2. Unspecified constipation -without complaints taking medication as needed   3. HTN (hypertension) -borderline high, not currently on medications will cont to monitor   4. Lung nodule -follow up with pulmonary was order last monht; do not see this was done and no pending appts; will follow up on this appt and rewrite orders.   5. Chronic kidney disease (CKD), stage IV (severe) -remains stable; cont to monitor kidney disease  6. Hyperlipidemia -LDL at goal; currently on Lipitor

## 2014-02-25 ENCOUNTER — Ambulatory Visit (INDEPENDENT_AMBULATORY_CARE_PROVIDER_SITE_OTHER)
Admission: RE | Admit: 2014-02-25 | Discharge: 2014-02-25 | Disposition: A | Discharge status: Home / Self Care | Payer: Medicare Other | Source: Ambulatory Visit | Attending: Pulmonary Disease | Admitting: Pulmonary Disease

## 2014-02-25 ENCOUNTER — Other Ambulatory Visit: Payer: Self-pay | Admitting: Pulmonary Disease

## 2014-02-25 ENCOUNTER — Encounter: Payer: Self-pay | Admitting: Pulmonary Disease

## 2014-02-25 ENCOUNTER — Ambulatory Visit (HOSPITAL_COMMUNITY): Admission: RE | Admit: 2014-02-25 | Payer: Medicare Other | Source: Ambulatory Visit

## 2014-02-25 ENCOUNTER — Ambulatory Visit (INDEPENDENT_AMBULATORY_CARE_PROVIDER_SITE_OTHER): Payer: Medicare Other | Admitting: Pulmonary Disease

## 2014-02-25 VITALS — BP 118/76 | HR 62 | Ht 61.0 in | Wt 147.0 lb

## 2014-02-25 DIAGNOSIS — R911 Solitary pulmonary nodule: Secondary | ICD-10-CM

## 2014-02-25 DIAGNOSIS — R059 Cough, unspecified: Secondary | ICD-10-CM

## 2014-02-25 DIAGNOSIS — R053 Chronic cough: Secondary | ICD-10-CM

## 2014-02-25 DIAGNOSIS — R05 Cough: Secondary | ICD-10-CM

## 2014-02-25 DIAGNOSIS — J439 Emphysema, unspecified: Secondary | ICD-10-CM | POA: Insufficient documentation

## 2014-02-25 MED ORDER — ALBUTEROL SULFATE HFA 108 (90 BASE) MCG/ACT IN AERS: 2.0000 | INHALATION_SPRAY | Freq: Four times a day (QID) | RESPIRATORY_TRACT | Status: DC | PRN

## 2014-02-25 NOTE — Patient Instructions (Signed)
Proair two puffs up to four times per day as needed for cough, wheeze, or chest congestion Chest xray today Will schedule breathing test (PFT) Follow up in 4 weeks

## 2014-02-25 NOTE — Assessment & Plan Note (Signed)
Resolved on CT chest from August 2014.

## 2014-02-25 NOTE — Assessment & Plan Note (Signed)
She has prior history of smoking.  Will get CXR and PFT's to further assess.  Will give trial of proair.

## 2014-02-25 NOTE — Progress Notes (Signed)
Chief Complaint  Patient presents with  . Follow-up    Breathing is doing well. Reports coughing with production of yellow mucus. Denies chest tightness, SOB or wheezing.    History of Present Illness: April [name] is a 79 y.o. female former smoker with chronic cough.  I last saw her in December 2013.  She had CT chest in August 2014 which showed resolution of previous nodule.    She still has cough.  She occasionally brings up yellow sputum.  She denies fever, wheeze, or hemoptysis.    She does not walk much. She says this happened after she had abdominal surgery.  She can't walk with assistance, but only for short distances >> she gets tired.  TESTS: CT chest 06/10/12 >> centrilobular emphysema, 7 mm nodule LUL CT chest 06/19/13 >> centrilobular emphysema, LUL nodule not evident  April [name]  has a past medical history of Diabetes mellitus; Hyperthyroidism; Hypertension; and Perforated ulcer.  April [name]  has past surgical history that includes Tumor removal and laparotomy (06/10/2012).  Prior to Admission medications   Medication Sig Start Date End Date Taking? Authorizing Provider  atorvastatin (LIPITOR) 20 MG tablet Take 20 mg by mouth daily.     Yes Historical Provider, MD  cholecalciferol (VITAMIN D) 1000 UNITS tablet Take 2,000 Units by mouth daily.   Yes Historical Provider, MD  cinacalcet (SENSIPAR) 30 MG tablet Take 30 mg by mouth 2 (two) times daily with a meal.   Yes Historical Provider, MD  pantoprazole (PROTONIX) 40 MG tablet Take 40 mg by mouth 2 (two) times daily before a meal. 06/24/12  Yes Henreitta Cea, PA-C  polyethylene glycol (MIRALAX / GLYCOLAX) packet Take 17 g by mouth daily.    Yes Historical Provider, MD    Allergies  Allergen Reactions  . Nsaids     Pt had perforated ulcer - patched [id number]     Physical Exam:  General - No distress, in wheel chair ENT - No sinus tenderness, no oral exudate, no LAN, poor hearing, wears dentures Cardiac - s1s2  regular, no murmur Chest - No wheeze/rales/dullness Back - No focal tenderness Abd - Soft, non-tender Ext - No edema Neuro - Normal strength Skin - No rashes Psych - normal mood, and behavior   Assessment/Plan:  Chesley Mires, MD  Pulmonary/Critical Care/Sleep Pager:  [phone]

## 2014-02-26 ENCOUNTER — Telehealth: Payer: Self-pay | Admitting: Pulmonary Disease

## 2014-02-27 ENCOUNTER — Telehealth: Payer: Self-pay | Admitting: Pulmonary Disease

## 2014-02-27 NOTE — Telephone Encounter (Signed)
Dg Chest 2 View  02/25/2014   CLINICAL DATA:  Chronic cough  EXAM: CHEST  2 VIEW  COMPARISON:  06/19/2013  FINDINGS: Cardiac shadow is within normal limits. The lungs are well aerated. Mild atelectasis is noted in the right lung base. No acute bony abnormality is seen.  IMPRESSION: Right basilar atelectasis.   Electronically Signed   By: Inez Catalina M.D.   On: 02/25/2014 16:08    Will have my nurse inform pt that CXR was normal.  No change to current treatment plan.

## 2014-03-02 NOTE — Telephone Encounter (Signed)
Pt's caregiver is aware of results.

## 2014-03-06 NOTE — Telephone Encounter (Signed)
Lmom for patient to call back to scheduled appts. X 2.

## 2014-03-06 NOTE — Telephone Encounter (Signed)
Patient scheduled for PFT and VS appt 6/17 at 1:00.  [facility] aware.  Nothing else needed.  [facility]

## 2014-04-22 ENCOUNTER — Non-Acute Institutional Stay (SKILLED_NURSING_FACILITY): Payer: PRIVATE HEALTH INSURANCE | Admitting: Internal Medicine

## 2014-04-22 ENCOUNTER — Ambulatory Visit (INDEPENDENT_AMBULATORY_CARE_PROVIDER_SITE_OTHER): Payer: Medicare Other | Admitting: Pulmonary Disease

## 2014-04-22 ENCOUNTER — Encounter: Payer: Self-pay | Admitting: Internal Medicine

## 2014-04-22 ENCOUNTER — Encounter: Payer: Self-pay | Admitting: Pulmonary Disease

## 2014-04-22 VITALS — BP 148/78 | HR 80 | Ht 61.0 in | Wt 147.0 lb

## 2014-04-22 DIAGNOSIS — R053 Chronic cough: Secondary | ICD-10-CM

## 2014-04-22 DIAGNOSIS — I10 Essential (primary) hypertension: Secondary | ICD-10-CM

## 2014-04-22 DIAGNOSIS — R911 Solitary pulmonary nodule: Secondary | ICD-10-CM

## 2014-04-22 DIAGNOSIS — E210 Primary hyperparathyroidism: Secondary | ICD-10-CM

## 2014-04-22 DIAGNOSIS — J439 Emphysema, unspecified: Secondary | ICD-10-CM

## 2014-04-22 DIAGNOSIS — K255 Chronic or unspecified gastric ulcer with perforation: Secondary | ICD-10-CM

## 2014-04-22 DIAGNOSIS — N184 Chronic kidney disease, stage 4 (severe): Secondary | ICD-10-CM

## 2014-04-22 DIAGNOSIS — J438 Other emphysema: Secondary | ICD-10-CM

## 2014-04-22 DIAGNOSIS — E119 Type 2 diabetes mellitus without complications: Secondary | ICD-10-CM

## 2014-04-22 DIAGNOSIS — R059 Cough, unspecified: Secondary | ICD-10-CM

## 2014-04-22 DIAGNOSIS — R05 Cough: Secondary | ICD-10-CM

## 2014-04-22 NOTE — Progress Notes (Signed)
PFT done today. 

## 2014-04-22 NOTE — Assessment & Plan Note (Signed)
She has prior history of smoking.  Her previous CT chest shows changes of emphysema.  She has elevated lung volumes and diffusion defect on PFT.  She did not have airflow obstruction.  She does not have significant symptoms at present.  Will continue prn albuterol for now.

## 2014-04-22 NOTE — Progress Notes (Signed)
Patient ID: April [name], female   DOB: Mar 19, 1935, 79 y.o.   MRN: [id number]  Location:  [facility] Provider:  Rexene Edison. Mariea Clonts, D.O., C.M.D.  Code Status:  Full code  Chief Complaint  Patient presents with  . Medical Management of Chronic Issues    HPI:  79 yo white female long term care resident seen for her routine visit. Review of Systems:  Review of Systems  Constitutional: Negative for fever.  HENT: Negative for congestion.   Eyes: Negative for blurred vision.  Respiratory: Negative for shortness of breath.   Cardiovascular: Negative for chest pain and leg swelling.  Gastrointestinal: Positive for constipation.  Genitourinary: Negative for dysuria.  Musculoskeletal: Negative for myalgias and falls.  Skin: Negative for rash.  Neurological: Negative for dizziness and weakness.  Psychiatric/Behavioral: Negative for depression and memory loss.    Medications: Patient's Medications  New Prescriptions   No medications on file  Previous Medications   ALBUTEROL (PROVENTIL HFA;VENTOLIN HFA) 108 (90 BASE) MCG/ACT INHALER    Inhale 1 puff into the lungs every 6 (six) hours as needed for wheezing or shortness of breath (For Cough).   ATORVASTATIN (LIPITOR) 20 MG TABLET    Take 20 mg by mouth daily.     CHOLECALCIFEROL (VITAMIN D) 1000 UNITS TABLET    Take 2,000 Units by mouth daily.   CINACALCET (SENSIPAR) 30 MG TABLET    Take 30 mg by mouth 2 (two) times daily with a meal.   PANTOPRAZOLE (PROTONIX) 40 MG TABLET    Take 40 mg by mouth daily.    POLYETHYLENE GLYCOL (MIRALAX / GLYCOLAX) PACKET    Take 17 g by mouth daily.   Modified Medications   No medications on file  Discontinued Medications   No medications on file    Physical Exam: Filed Vitals:   04/22/14 1255  BP: 129/63  Pulse: 68  Temp: 97.5 F (36.4 C)  Resp: 19  Height: 5\' 2"  (1.575 m)  Weight: 147 lb (66.679 kg)  SpO2: 97%  Physical Exam  Constitutional: She is oriented to person, place,  and time. She appears well-developed and well-nourished. No distress.  Cardiovascular: Normal rate, regular rhythm, normal heart sounds and intact distal pulses.   Pulmonary/Chest: Effort normal and breath sounds normal. No respiratory distress.  Abdominal: Soft. Bowel sounds are normal. She exhibits no distension and no mass. There is no tenderness.  Musculoskeletal: Normal range of motion.  Neurological: She is alert and oriented to person, place, and time.  Skin: Skin is warm and dry.  Psychiatric: She has a normal mood and affect.       Assessment/Plan 1. Hyperparathyroidism, primary -f/u ica, pth -cont sensipar  2. DM (diabetes mellitus) -diet only  3. Perforated gastric ulcer -in past, avoid anticoagulants, nsaids  4. HTN (hypertension) -bp at goal w/o meds at present  5. Chronic kidney disease (CKD), stage IV (severe) -avoid nsaids, maintain hydration, monitor electrolytes  6. Lung nodule -on CT, follows with Dr. Halford Chessman for her emphysema and just had PFTs done today  Family/ staff Communication: d/w nurse  Goals of care: long term care, full code  Labs/tests ordered:  Bmp, iCa, PTH

## 2014-04-22 NOTE — Progress Notes (Signed)
Chief Complaint  Patient presents with  . Follow-up    PFT today. No compliants.     History of Present Illness: April [name] is a 79 y.o. female former smoker with chronic cough.  She is here to review her PFT.  This showed mild diffusion defect and elevated lung volumes consistent with emphysema.  She has not been using inhaler >> doesn't feel like she needs it.  She denies cough, wheeze, sputum, chest pain, or leg swelling.  TESTS: CT chest 06/10/12 >> centrilobular emphysema, 7 mm nodule LUL CT chest 06/19/13 >> centrilobular emphysema, LUL nodule not evident PFT 04/22/14 >> FEV1 1.52 (85%), FEV1% 76, TLC 5.89 (128%), DLCO 62%, no BD  April [name]  has a past medical history of Diabetes mellitus; Hyperthyroidism; Hypertension; and Perforated ulcer.  April [name]  has past surgical history that includes Tumor removal and laparotomy (06/10/2012).  Prior to Admission medications   Medication Sig Start Date End Date Taking? Authorizing Provider  atorvastatin (LIPITOR) 20 MG tablet Take 20 mg by mouth daily.     Yes Historical Provider, MD  cholecalciferol (VITAMIN D) 1000 UNITS tablet Take 2,000 Units by mouth daily.   Yes Historical Provider, MD  cinacalcet (SENSIPAR) 30 MG tablet Take 30 mg by mouth 2 (two) times daily with a meal.   Yes Historical Provider, MD  pantoprazole (PROTONIX) 40 MG tablet Take 40 mg by mouth 2 (two) times daily before a meal. 06/24/12  Yes Henreitta Cea, PA-C  polyethylene glycol (MIRALAX / GLYCOLAX) packet Take 17 g by mouth daily.    Yes Historical Provider, MD    Allergies  Allergen Reactions  . Nsaids     Pt had perforated ulcer - patched [id number]     Physical Exam:  General - No distress, in wheel chair ENT - No sinus tenderness, no oral exudate, no LAN, poor hearing, wears dentures Cardiac - s1s2 regular, no murmur Chest - No wheeze/rales/dullness Back - No focal tenderness Abd - Soft, non-tender Ext - No edema Neuro - Normal  strength Skin - No rashes Psych - normal mood, and behavior   Assessment/Plan:  Chesley Mires, MD [place] Pulmonary/Critical Care/Sleep Pager:  [phone]

## 2014-04-22 NOTE — Patient Instructions (Signed)
Follow up in 1 year.

## 2014-06-03 LAB — PULMONARY FUNCTION TEST
DL/VA % PRED: 75 %
DL/VA: 3.32 ml/min/mmHg/L
DLCO unc % pred: 62 %
DLCO unc: 12.56 ml/min/mmHg
FEF 25-75 Post: 1.4 L/sec
FEF 25-75 Pre: 1.34 L/sec
FEF2575-%Change-Post: 3 %
FEF2575-%PRED-POST: 98 %
FEF2575-%Pred-Pre: 95 %
FEV1-%CHANGE-POST: 1 %
FEV1-%Pred-Post: 85 %
FEV1-%Pred-Pre: 84 %
FEV1-POST: 1.52 L
FEV1-Pre: 1.5 L
FEV1FVC-%Change-Post: -3 %
FEV1FVC-%PRED-PRE: 104 %
FEV6-%Change-Post: 5 %
FEV6-%Pred-Post: 88 %
FEV6-%Pred-Pre: 84 %
FEV6-Post: 2.01 L
FEV6-Pre: 1.91 L
FEV6FVC-%PRED-PRE: 105 %
FEV6FVC-%Pred-Post: 105 %
FVC-%CHANGE-POST: 4 %
FVC-%Pred-Post: 84 %
FVC-%Pred-Pre: 80 %
FVC-Post: 2.01 L
FVC-Pre: 1.92 L
POST FEV1/FVC RATIO: 76 %
PRE FEV1/FVC RATIO: 78 %
PRE FEV6/FVC RATIO: 100 %
Post FEV6/FVC ratio: 100 %
RV % pred: 174 %
RV: 3.78 L
TLC % PRED: 128 %
TLC: 5.89 L

## 2014-06-24 ENCOUNTER — Non-Acute Institutional Stay (SKILLED_NURSING_FACILITY): Payer: PRIVATE HEALTH INSURANCE | Admitting: Internal Medicine

## 2014-06-24 ENCOUNTER — Encounter: Payer: Self-pay | Admitting: Internal Medicine

## 2014-06-24 DIAGNOSIS — I10 Essential (primary) hypertension: Secondary | ICD-10-CM

## 2014-06-24 DIAGNOSIS — K5900 Constipation, unspecified: Secondary | ICD-10-CM

## 2014-06-24 DIAGNOSIS — E210 Primary hyperparathyroidism: Secondary | ICD-10-CM

## 2014-06-24 DIAGNOSIS — N184 Chronic kidney disease, stage 4 (severe): Secondary | ICD-10-CM

## 2014-06-24 DIAGNOSIS — E119 Type 2 diabetes mellitus without complications: Secondary | ICD-10-CM

## 2014-06-24 DIAGNOSIS — K255 Chronic or unspecified gastric ulcer with perforation: Secondary | ICD-10-CM

## 2014-06-24 NOTE — Progress Notes (Signed)
Patient ID: April [name], female   DOB: 01-13-1935, 79 y.o.   MRN: [id number]  Location:  [facility] Provider:  Rexene Edison. Mariea Clonts, D.O., C.M.D.  Code Status:  Full code  Chief Complaint  Patient presents with  . Medical Management of Chronic Issues    HPI:  79 yo white female long term care resident with a history of hyperparathyroidism, htn, DMII, prior gastric ulcer with perforation, hyperlipidemia, and COPD/chronic bronchitis, and chronic constipation seen for med mgt of chronic diseases.  She was sleeping when I came in and had no complaints thereafter.  Review of Systems:  Review of Systems  Constitutional: Negative for fever, weight loss and malaise/fatigue.  HENT: Negative for congestion.   Eyes: Negative for blurred vision.  Respiratory: Negative for shortness of breath.   Cardiovascular: Negative for chest pain and leg swelling.  Gastrointestinal: Positive for constipation.  Genitourinary: Negative for dysuria, urgency and frequency.  Musculoskeletal: Negative for falls and myalgias.  Skin: Negative for rash.  Neurological: Negative for dizziness, loss of consciousness, weakness and headaches.  Endo/Heme/Allergies: Does not bruise/bleed easily.  Psychiatric/Behavioral: Negative for memory loss.    Medications: Patient's Medications  New Prescriptions   No medications on file  Previous Medications   ALBUTEROL (PROVENTIL HFA;VENTOLIN HFA) 108 (90 BASE) MCG/ACT INHALER    Inhale 1 puff into the lungs every 6 (six) hours as needed for wheezing or shortness of breath (For Cough).   ATORVASTATIN (LIPITOR) 20 MG TABLET    Take 20 mg by mouth daily.     CHOLECALCIFEROL (VITAMIN D) 1000 UNITS TABLET    Take 2,000 Units by mouth daily.   CINACALCET (SENSIPAR) 30 MG TABLET    Take 30 mg by mouth 2 (two) times daily with a meal.   PANTOPRAZOLE (PROTONIX) 40 MG TABLET    Take 40 mg by mouth daily.    POLYETHYLENE GLYCOL (MIRALAX / GLYCOLAX) PACKET    Take 17 g by  mouth daily.   Modified Medications   No medications on file  Discontinued Medications   No medications on file    Physical Exam: Filed Vitals:   06/24/14 1351  BP: 145/80  Pulse: 64  Temp: 98.3 F (36.8 C)  Resp: 18  Height: 5\' 2"  (1.575 m)  Weight: 146 lb (66.225 kg)  SpO2: 97%  Physical Exam  Constitutional: She is oriented to person, place, and time. She appears well-developed and well-nourished. No distress.  Cardiovascular: Normal rate, regular rhythm, normal heart sounds and intact distal pulses.   Pulmonary/Chest: Effort normal and breath sounds normal. No respiratory distress.  Abdominal: Soft. Bowel sounds are normal. She exhibits no distension and no mass. There is no tenderness.  Musculoskeletal: Normal range of motion.  Neurological: She is alert and oriented to person, place, and time.  Skin: Skin is warm and dry.  Psychiatric: She has a normal mood and affect.    Assessment/Plan 1. Hyperparathyroidism, primary -cont sensipar, she and family opted to avoid surgery for parathyroidectomy, could also be partially secondary with her renal failure -cont vit D  2. Type 2 diabetes mellitus without complication -not on meds for this aside from statin -will need f/u hba1c  3. Essential hypertension -bp good for her age and frailty w/o meds (<150/90)  4. Chronic kidney disease (CKD), stage IV (severe) -cont sensipar Avoid nsaids  5. Unspecified constipation -cont miralax, not helped by hypercalcemia  6. Perforated gastric ulcer, chronic or unspecified -resolved, cont to avoid nsaids, cont protonix  Family/ staff Communication: nursing did not have concerns  Goals of care: long term care resident--seems she could possibly stay in [place]?  Labs/tests ordered:  Will need bmp, lipids next visit

## 2014-06-29 ENCOUNTER — Encounter: Payer: Self-pay | Admitting: Internal Medicine

## 2014-07-09 ENCOUNTER — Encounter: Payer: Self-pay | Admitting: Pulmonary Disease

## 2014-08-18 ENCOUNTER — Non-Acute Institutional Stay (SKILLED_NURSING_FACILITY): Payer: PRIVATE HEALTH INSURANCE | Admitting: Nurse Practitioner

## 2014-08-18 DIAGNOSIS — N189 Chronic kidney disease, unspecified: Secondary | ICD-10-CM

## 2014-08-18 DIAGNOSIS — K5901 Slow transit constipation: Secondary | ICD-10-CM

## 2014-08-18 DIAGNOSIS — E119 Type 2 diabetes mellitus without complications: Secondary | ICD-10-CM

## 2014-08-18 DIAGNOSIS — E210 Primary hyperparathyroidism: Secondary | ICD-10-CM

## 2014-08-18 DIAGNOSIS — E785 Hyperlipidemia, unspecified: Secondary | ICD-10-CM

## 2014-08-18 DIAGNOSIS — I10 Essential (primary) hypertension: Secondary | ICD-10-CM

## 2014-08-18 NOTE — Progress Notes (Signed)
Patient ID: April [name], female   DOB: June 02, 1935, 80 y.o.   MRN: [id number]    Nursing Home Location:  [facility]   Place of Service: SNF (31)  PCP: REED, TIFFANY, DO  Allergies  Allergen Reactions  . Nsaids     Pt had perforated ulcer - patched [id number]    Chief Complaint  Patient presents with  . Medical Management of Chronic Issues    HPI: Ms April [name] is a 27 female with a PMH of HTN, Hyperlipidemia, benign tumor of pituitary gland, hyperparathyroidism, renal insuffiencey and constipation is being seen for the management of her chronic conditions; pt in room at time of exam, nursing without concerns and pt without complaints. There has been no acute issues since last visit.   Review of Systems:  Review of Systems  Constitutional: Negative for fever, chills and malaise/fatigue.  HENT: Negative for congestion.   Eyes: Negative for blurred vision.  Respiratory: Negative for cough, sputum production and shortness of breath.   Cardiovascular: Negative for chest pain and leg swelling.  Gastrointestinal: Negative for heartburn, nausea, vomiting, abdominal pain, diarrhea and constipation.  Genitourinary: Negative for dysuria.  Musculoskeletal: Negative for falls and myalgias.  Skin: Negative for rash.  Neurological: Negative for dizziness and weakness.  Endo/Heme/Allergies: Does not bruise/bleed easily.  Psychiatric/Behavioral: Negative for depression. The patient is not nervous/anxious and does not have insomnia.      Past Medical History  Diagnosis Date  . Diabetes mellitus   . Hyperthyroidism   . Hypertension   . Perforated ulcer    Past Surgical History  Procedure Laterality Date  . 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 Laparotomy  06/10/2012    Procedure: Omental patch of perforated ulcer, EXPLORATORY LAPAROTOMY;  Surgeon: Odis Hollingshead, MD;  Location: [facility];  Service: General;  Laterality: N/A;  closure of perforated gastric ulcer    Social History:   reports that she quit smoking about 2 years ago. Her smoking use included Cigarettes. She has a 52 pack-year smoking history. She has never used smokeless tobacco. She reports that she does not drink alcohol or use illicit drugs.  No family history on file.  Medications: Patient's Medications  New Prescriptions   No medications on file  Previous Medications   ALBUTEROL (PROVENTIL HFA;VENTOLIN HFA) 108 (90 BASE) MCG/ACT INHALER    Inhale 1 puff into the lungs every 6 (six) hours as needed for wheezing or shortness of breath (For Cough).   ATORVASTATIN (LIPITOR) 20 MG TABLET    Take 20 mg by mouth daily.     CHOLECALCIFEROL (VITAMIN D) 1000 UNITS TABLET    Take 2,000 Units by mouth daily.   CINACALCET (SENSIPAR) 30 MG TABLET    Take 30 mg by mouth 2 (two) times daily with a meal.   PANTOPRAZOLE (PROTONIX) 40 MG TABLET    Take 40 mg by mouth daily.    POLYETHYLENE GLYCOL (MIRALAX / GLYCOLAX) PACKET    Take 17 g by mouth daily.   Modified Medications   No medications on file  Discontinued Medications   No medications on file     Physical Exam:  Filed Vitals:   08/18/14 1121  BP: 119/73  Pulse: 65  Temp: 98.2 F (36.8 C)  Resp: 18  Weight: 148 lb (67.132 kg)   Physical Exam  Constitutional: She is well-developed, well-nourished, and in no distress. No distress.  HENT:  Head: Normocephalic and atraumatic.  Mouth/Throat:  Oropharynx is clear and moist. No oropharyngeal exudate.  Eyes: Conjunctivae and EOM are normal. Pupils are equal, round, and reactive to light.  Neck: Normal range of motion. Neck supple.  Cardiovascular: Normal rate, regular rhythm and normal heart sounds.   Pulmonary/Chest: Effort normal and breath sounds normal.  Abdominal: Soft. Bowel sounds are normal. She exhibits no distension.  Musculoskeletal: She exhibits no edema and no tenderness.  Self propels in wc  Neurological: She is alert.  Skin: Skin is warm and dry. She is not  diaphoretic.  Psychiatric: Affect normal.      Labs reviewed: 02/05/13: A1c 6.1  Cholesterol 99, triglyceride 69, hdl 34, LDL 51  Wbc 7.1, rbc 3.81, hgb 11.0, hct 33.3  Sodium 145, potassium 4.5, glucose 89, BUN 31, Cr 2.22  ASL 9, ALT 8  03/26/13: sodium 141, potassium 4.2, glucose 123, BUN 23, CR 2.57  06/16/13: ionized calcium 1.2, parathyroid 638.8, calcium 8.4, vit d 19  07/16/13: sodium 143, potassium 4.3, glucose 108, BUN 28, Cr 2.51  Ionized Calcium 1.14  VId D 35  09/13/13 calcium, ion 4.9 11/07/13 wbc 8.5, hgb 10.9, hct 34.5, plts 208 A1c 6.4 Vit D 36 Glucose 111, BUN 24, Cr 2.13, sodium 142, potassium 4.2 12/11/13 CHOL 93, trig 113, HDL 24, LDL 46  Assessment/Plan 1. DM (diabetes mellitus) -not currently on medications, will follow up a1c  2. Unspecified constipation -without complaints taking medication as needed   3. HTN (hypertension) -within appropriate range, not currently on medication  4. Hyperparathyroidism, primary conts on sensipar   5. CRF (chronic renal failure), unspecified stage -will followup cbc and cmp  6. Hyperlipidemia -LDL at goal; currently on Lipitor

## 2014-08-19 LAB — HEMOGLOBIN A1C: Hgb A1c MFr Bld: 6.3 % — AB (ref 4.0–6.0)

## 2014-10-06 ENCOUNTER — Non-Acute Institutional Stay (SKILLED_NURSING_FACILITY): Payer: PRIVATE HEALTH INSURANCE | Admitting: Adult Health

## 2014-10-06 DIAGNOSIS — E119 Type 2 diabetes mellitus without complications: Secondary | ICD-10-CM

## 2014-10-06 DIAGNOSIS — E210 Primary hyperparathyroidism: Secondary | ICD-10-CM

## 2014-10-06 DIAGNOSIS — I10 Essential (primary) hypertension: Secondary | ICD-10-CM

## 2014-10-06 DIAGNOSIS — K219 Gastro-esophageal reflux disease without esophagitis: Secondary | ICD-10-CM

## 2014-10-06 DIAGNOSIS — E785 Hyperlipidemia, unspecified: Secondary | ICD-10-CM

## 2014-10-07 ENCOUNTER — Encounter: Payer: Self-pay | Admitting: Adult Health

## 2014-10-07 DIAGNOSIS — E785 Hyperlipidemia, unspecified: Secondary | ICD-10-CM | POA: Insufficient documentation

## 2014-10-07 DIAGNOSIS — K219 Gastro-esophageal reflux disease without esophagitis: Secondary | ICD-10-CM | POA: Insufficient documentation

## 2014-10-07 NOTE — Progress Notes (Signed)
Patient ID: April [name], female   DOB: 07-Jun-1935, 79 y.o.   MRN: [id number]  [facility]     Allergies  Allergen Reactions  . Nsaids     Pt had perforated ulcer - patched [id number]       Chief Complaint  Patient presents with  . Medical Management of Chronic Issues    HPI:  She is a long term resident of this facility being seen for the management of her chronic illnesses. Overall she remains without significant change. She states that she is feeling good and is doing well. There are no reports of any recent illnesses present. There are no nursing concerns at this time.     Past Medical History  Diagnosis Date  . Diabetes mellitus   . Hyperthyroidism   . Hypertension   . Perforated ulcer     Past Surgical History  Procedure Laterality Date  . 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 Laparotomy  06/10/2012    Procedure: Omental patch of perforated ulcer, EXPLORATORY LAPAROTOMY;  Surgeon: Odis Hollingshead, MD;  Location: [facility];  Service: General;  Laterality: N/A;  closure of perforated gastric ulcer    VITAL SIGNS BP 123/80 mmHg  Pulse 65  Ht 5\' 2"  (1.575 m)  Wt 147 lb (66.679 kg)  BMI 26.88 kg/m2  SpO2 97%   Outpatient Encounter Prescriptions as of 10/06/2014  Medication Sig  . albuterol (PROVENTIL HFA;VENTOLIN HFA) 108 (90 BASE) MCG/ACT inhaler Inhale 1 puff into the lungs every 6 (six) hours as needed for wheezing or shortness of breath (For Cough).  Marland Kitchen atorvastatin (LIPITOR) 20 MG tablet Take 20 mg by mouth daily.    . cholecalciferol (VITAMIN D) 1000 UNITS tablet Take 2,000 Units by mouth daily.  . cinacalcet (SENSIPAR) 30 MG tablet Take 30 mg by mouth 2 (two) times daily with a meal.  . pantoprazole (PROTONIX) 40 MG tablet Take 40 mg by mouth daily.   . polyethylene glycol (MIRALAX / GLYCOLAX) packet Take 17 g by mouth daily as needed.      SIGNIFICANT DIAGNOSTIC EXAMS  LABS REVIEWED:   12-11-13: chol 93; ldl 46; trig 113 08-19-14: wbc 7.8; hgb  10.6; hct 34.9; mcv 89.4; lt 195; glucose 110; bun 34.2 creat 4.13; k+4.7 ;na++148; liver normal albumin 3.4 ca++ 8.6; hgb a1c 6.3     Review of Systems  Constitutional: Negative for malaise/fatigue.  Respiratory: Negative for cough and shortness of breath.   Cardiovascular: Negative for chest pain, palpitations and leg swelling.  Gastrointestinal: Negative for heartburn, abdominal pain and constipation.  Musculoskeletal: Negative for myalgias, back pain and joint pain.  Skin: Negative.   Neurological: Negative for headaches.  Psychiatric/Behavioral: Negative for depression. The patient is not nervous/anxious.      Physical Exam  Constitutional: She is oriented to person, place, and time. She appears well-developed and well-nourished. No distress.  Neck: Neck supple. No JVD present. No thyromegaly present.  Cardiovascular: Normal rate and regular rhythm.   Respiratory: Effort normal and breath sounds normal. No respiratory distress. She has no wheezes.  GI: Soft. Bowel sounds are normal. She exhibits no distension. There is no tenderness.  Musculoskeletal: Normal range of motion. She exhibits no edema.  Neurological: She is alert and oriented to person, place, and time.  Skin: Skin is warm and dry. She is not diaphoretic.     ASSESSMENT/ PLAN:  1. Dyslipidemia: her last ldl was 46; will continue lipitor 20 mg daily  2. GERD: is stable will continue protonix 40 mg daily   3. Hyperparathyroidism: is stable is on sensipar 30 mg daily; no recent pth present; will check pth and ca++ levels  4. Constipation: will continue miralax daily as needed  5. Hypertension: she is presently not on medications; will not make changes will monitor her status.   6. Diabetes; her hgb a1c is 6.3; she is presently not taking medications; will not make changes  7. Emphysema: is stable will continue albuterol 2 puffs every 6 hours as needed.     Ok Edwards NP [facility]    Contact [phone] Monday through Friday 8am- 5pm  After hours call [phone]

## 2014-11-06 DIAGNOSIS — E1165 Type 2 diabetes mellitus with hyperglycemia: Secondary | ICD-10-CM | POA: Diagnosis not present

## 2014-11-06 DIAGNOSIS — M2550 Pain in unspecified joint: Secondary | ICD-10-CM | POA: Diagnosis not present

## 2014-11-06 DIAGNOSIS — E785 Hyperlipidemia, unspecified: Secondary | ICD-10-CM | POA: Diagnosis not present

## 2014-11-07 ENCOUNTER — Non-Acute Institutional Stay (SKILLED_NURSING_FACILITY): Payer: Medicare Other | Admitting: Internal Medicine

## 2014-11-07 ENCOUNTER — Encounter: Payer: Self-pay | Admitting: Internal Medicine

## 2014-11-07 DIAGNOSIS — I10 Essential (primary) hypertension: Secondary | ICD-10-CM

## 2014-11-07 DIAGNOSIS — M109 Gout, unspecified: Secondary | ICD-10-CM

## 2014-11-07 DIAGNOSIS — M10041 Idiopathic gout, right hand: Secondary | ICD-10-CM

## 2014-11-07 DIAGNOSIS — N184 Chronic kidney disease, stage 4 (severe): Secondary | ICD-10-CM

## 2014-11-07 NOTE — Progress Notes (Signed)
Patient ID: April [name], female   DOB: [birth year], 80 y.o.   MRN: [id number]    [facility]  PCP: Hollace Kinnier, DO   Code Status: Full  Allergies  Allergen Reactions  . Nsaids     Pt had perforated ulcer - patched [id number]    Chief Complaint  Patient presents with  . Acute Visit    right 3rd finger pain     HPI:   80 yo female seen today for an acute visit for above. She c/o finger pain x 3 days. Joint feels hot and very painful. No hx gout. Xray was neg for fx but showed degenerative changes. She has a hx gastric ulcer that required surgical repair and cannot take ASA pdts.  Review of Systems:  Constitutional: Negative for fever, chills, weight loss, malaise/fatigue and diaphoresis.   Musculoskeletal: Negative for back pain, falls. (+) joint pain No myalgias. Skin: Negative for itching, sores and rash.  Neurological: Negative for tingling, focal weakness    Past Medical History  Diagnosis Date  . Diabetes mellitus   . Hyperthyroidism   . Hypertension   . Perforated ulcer    Past Surgical History  Procedure Laterality Date  . 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 Laparotomy  06/10/2012    Procedure: Omental patch of perforated ulcer, EXPLORATORY LAPAROTOMY;  Surgeon: Odis Hollingshead, MD;  Location: [facility];  Service: General;  Laterality: N/A;  closure of perforated gastric ulcer   Social History:   reports that she quit smoking about 2 years ago. Her smoking use included Cigarettes. She has a 52 pack-year smoking history. She has never used smokeless tobacco. She reports that she does not drink alcohol or use illicit drugs.  No family history on file.  Medications: Patient's Medications  New Prescriptions   No medications on file  Previous Medications   ALBUTEROL (PROVENTIL HFA;VENTOLIN HFA) 108 (90 BASE) MCG/ACT INHALER    Inhale 1 puff into the lungs every 6 (six) hours as needed for wheezing or shortness of breath (For Cough).   ATORVASTATIN (LIPITOR) 20 MG TABLET    Take 20 mg by mouth daily.     CHOLECALCIFEROL (VITAMIN D) 1000 UNITS TABLET    Take 2,000 Units by mouth daily.   CINACALCET (SENSIPAR) 30 MG TABLET    Take 30 mg by mouth 2 (two) times daily with a meal.   PANTOPRAZOLE (PROTONIX) 40 MG TABLET    Take 40 mg by mouth daily.    POLYETHYLENE GLYCOL (MIRALAX / GLYCOLAX) PACKET    Take 17 g by mouth daily as needed.   Modified Medications   No medications on file  Discontinued Medications   No medications on file     Physical Exam: CONSTITUTIONAL: Looks well in NAD. Awake, alert and oriented x 3 EXTREMITIES: No edema b/l. Distal pulses palpable. No calf tenderness MUSC: FROM at right 3rd finger but MCP joint swollen, red hot and TTP with reduced ROM. Reduced grip strength. (+) radial/ulnar pulses. Left hand/fingers with FROM   Filed Vitals:   11/02/14 1143  BP: 130/73  Pulse: 81  Temp: 97.6 F (36.4 C)  Weight: 147 lb (66.679 kg)  SpO2: 97%        Assessment/Plan   ICD-9-CM ICD-10-CM   1. Acute gout of right hand, unspecified cause 274.01 M10.041   2. Essential hypertension 401.9 I10   3. Chronic kidney disease (CKD), stage IV (severe) 585.4 N18.4     -  Rx colchicine 0.6mg  daily x 3.   - No indomethacine due to hx bleeding ulcers on ASA  - continue all other meds as Rx   Family/ staff Communication: discussed with pt and nursing staff  Labs/tests ordered: serum uric acid level and sed rate   Agness Sibrian S. Perlie Gold  [facility] [address] [place], [place] [address] [phone] Office (Wednesdays and Fridays 8 AM - 5 PM) [phone] Cell (Monday-Friday 8 AM - 5 PM)

## 2014-11-08 MED ORDER — PROMETHAZINE HCL 12.5 MG RE SUPP: 12.5000 mg | Freq: Four times a day (QID) | RECTAL | Status: DC | PRN

## 2014-11-18 ENCOUNTER — Non-Acute Institutional Stay (SKILLED_NURSING_FACILITY): Payer: Medicare Other | Admitting: Adult Health

## 2014-11-18 DIAGNOSIS — N184 Chronic kidney disease, stage 4 (severe): Secondary | ICD-10-CM

## 2014-11-18 DIAGNOSIS — E119 Type 2 diabetes mellitus without complications: Secondary | ICD-10-CM

## 2014-11-18 DIAGNOSIS — E210 Primary hyperparathyroidism: Secondary | ICD-10-CM

## 2014-11-18 DIAGNOSIS — K219 Gastro-esophageal reflux disease without esophagitis: Secondary | ICD-10-CM | POA: Diagnosis not present

## 2014-11-18 DIAGNOSIS — M1A9XX0 Chronic gout, unspecified, without tophus (tophi): Secondary | ICD-10-CM

## 2014-11-18 DIAGNOSIS — E785 Hyperlipidemia, unspecified: Secondary | ICD-10-CM

## 2014-11-19 DIAGNOSIS — E559 Vitamin D deficiency, unspecified: Secondary | ICD-10-CM | POA: Diagnosis not present

## 2014-11-19 DIAGNOSIS — E1165 Type 2 diabetes mellitus with hyperglycemia: Secondary | ICD-10-CM | POA: Diagnosis not present

## 2014-11-19 DIAGNOSIS — E785 Hyperlipidemia, unspecified: Secondary | ICD-10-CM | POA: Diagnosis not present

## 2014-11-19 DIAGNOSIS — E039 Hypothyroidism, unspecified: Secondary | ICD-10-CM | POA: Diagnosis not present

## 2014-12-08 ENCOUNTER — Encounter: Payer: Self-pay | Admitting: Adult Health

## 2014-12-08 DIAGNOSIS — M1A9XX0 Chronic gout, unspecified, without tophus (tophi): Secondary | ICD-10-CM | POA: Insufficient documentation

## 2014-12-08 NOTE — Progress Notes (Signed)
Patient ID: April [name], female   DOB: February 28, 1935, 80 y.o.   MRN: [id number]  [facility]     Allergies  Allergen Reactions  . Nsaids     Pt had perforated ulcer - patched [id number]       Chief Complaint  Patient presents with  . Medical Management of Chronic Issues    HPI:  She is a long term resident of this facility being seen for the management of her chronic illnesses. Overall her status remains without significant change. She has had a gouty flare her right 3rd finger is feeling better but has swelling present.    Past Medical History  Diagnosis Date  . Diabetes mellitus   . Hyperthyroidism   . Hypertension   . Perforated ulcer     Past Surgical History  Procedure Laterality Date  . 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 Laparotomy  06/10/2012    Procedure: Omental patch of perforated ulcer, EXPLORATORY LAPAROTOMY;  Surgeon: Odis Hollingshead, MD;  Location: [facility];  Service: General;  Laterality: N/A;  closure of perforated gastric ulcer    VITAL SIGNS BP 135/85 mmHg  Pulse 56  Ht 5\' 2"  (1.575 m)  Wt 148 lb (67.132 kg)  BMI 27.06 kg/m2  SpO2 97%   Outpatient Encounter Prescriptions as of 11/18/2014  Medication Sig  . albuterol (PROVENTIL HFA;VENTOLIN HFA) 108 (90 BASE) MCG/ACT inhaler Inhale 1 puff into the lungs every 6 (six) hours as needed for wheezing or shortness of breath (For Cough).  Marland Kitchen atorvastatin (LIPITOR) 20 MG tablet Take 20 mg by mouth daily.    . cholecalciferol (VITAMIN D) 1000 UNITS tablet Take 2,000 Units by mouth daily.  . cinacalcet (SENSIPAR) 30 MG tablet Take 30 mg by mouth 2 (two) times daily with a meal.  . pantoprazole (PROTONIX) 40 MG tablet Take 40 mg by mouth daily.   . polyethylene glycol (MIRALAX / GLYCOLAX) packet Take 17 g by mouth daily as needed.   . promethazine (PHENERGAN) 12.5 MG suppository Place 1 suppository (12.5 mg total) rectally every 6 (six) hours as needed for nausea or vomiting.     SIGNIFICANT DIAGNOSTIC  EXAMS  11-01-14: right hand x-ray: no acute change; degenerative changes present.   LABS REVIEWED:   12-11-13: chol 93; ldl 46; trig 113 08-19-14: wbc 7.8; hgb 10.6; hct 34.9; mcv 89.4; lt 195; glucose 110; bun 34.2 creat 4.13; k+4.7 ;na++148; liver normal albumin 3.4 ca++ 8.6; hgb a1c 6.3  10-08-14: wbc 8.1; hgb 8.7; hct 27.3; mcv 88.8; plt 185; gluocse 103; bun 29; creat 3.19; k+4.8; na++145; liver normal albumin 3.5 11-06-14: uric acid 7.5      ROS Constitutional: Negative for malaise/fatigue.  Respiratory: Negative for cough and shortness of breath.   Cardiovascular: Negative for chest pain, palpitations and leg swelling.  Gastrointestinal: Negative for heartburn, abdominal pain and constipation.  Musculoskeletal: Negative for myalgias, back pain and joint pain.  Skin: Negative.   Neurological: Negative for headaches.  Psychiatric/Behavioral: Negative for depression. The patient is not nervous/anxious.      Physical Exam Constitutional: She is oriented to person, place, and time. She appears well-developed and well-nourished. No distress.  Neck: Neck supple. No JVD present. No thyromegaly present.  Cardiovascular: Normal rate and regular rhythm.   Respiratory: Effort normal and breath sounds normal. No respiratory distress. She has no wheezes.  GI: Soft. Bowel sounds are normal. She exhibits no distension. There is no tenderness.  Musculoskeletal: Normal range  of motion. She exhibits no edema.  Neurological: She is alert and oriented to person, place, and time.  Skin: Skin is warm and dry. She is not diaphoretic.    ASSESSMENT/ PLAN:  1. Dyslipidemia: her last ldl was 46; will continue lipitor 20 mg daily   2. GERD: is stable will continue protonix 40 mg daily   3. Hyperparathyroidism: is stable is on sensipar 30 mg daily; no recent pth present;   4. Constipation: will continue miralax daily as needed  5. Hypertension: she is presently not on medications; will not make  changes will monitor her status.   6. Diabetes; her hgb a1c is 6.3; she is presently not taking medications; will not make changes  7. Emphysema: is stable will continue albuterol 2 puffs every 6 hours as needed.   8. Gout: for her recent flare with her chronic renal disease will being urolic 40 mg daily and will monitor her status.   9/ CKD stage IV: is without change in status; will continue to monitor her status her creat is 3.19.    Will check pth; vit d; uric acid and lipids.     Ok Edwards NP [facility]  Contact [phone] Monday through Friday 8am- 5pm  After hours call [phone]

## 2014-12-17 ENCOUNTER — Non-Acute Institutional Stay (SKILLED_NURSING_FACILITY): Payer: Medicare Other | Admitting: Adult Health

## 2014-12-17 ENCOUNTER — Encounter: Payer: Self-pay | Admitting: Adult Health

## 2014-12-17 DIAGNOSIS — K219 Gastro-esophageal reflux disease without esophagitis: Secondary | ICD-10-CM

## 2014-12-17 DIAGNOSIS — E785 Hyperlipidemia, unspecified: Secondary | ICD-10-CM | POA: Diagnosis not present

## 2014-12-17 DIAGNOSIS — D352 Benign neoplasm of pituitary gland: Secondary | ICD-10-CM | POA: Diagnosis not present

## 2014-12-17 DIAGNOSIS — E210 Primary hyperparathyroidism: Secondary | ICD-10-CM

## 2014-12-17 DIAGNOSIS — N184 Chronic kidney disease, stage 4 (severe): Secondary | ICD-10-CM | POA: Diagnosis not present

## 2014-12-17 DIAGNOSIS — J439 Emphysema, unspecified: Secondary | ICD-10-CM | POA: Diagnosis not present

## 2014-12-17 DIAGNOSIS — M1A9XX0 Chronic gout, unspecified, without tophus (tophi): Secondary | ICD-10-CM

## 2014-12-17 DIAGNOSIS — E119 Type 2 diabetes mellitus without complications: Secondary | ICD-10-CM | POA: Diagnosis not present

## 2014-12-17 DIAGNOSIS — I10 Essential (primary) hypertension: Secondary | ICD-10-CM | POA: Diagnosis not present

## 2014-12-17 NOTE — Progress Notes (Signed)
Patient ID: April [name], female   DOB: 14-Apr-1935, 80 y.o.   MRN: [id number]  [facility]     Allergies  Allergen Reactions  . Nsaids     Pt had perforated ulcer - patched [id number]       Chief Complaint  Patient presents with  . Annual Exam    HPI:  She is a long term resident of this facility being seen for her annual reviewed. she has remained stable over the past year without hospitalizations present. She states that she is feeling good. She is wanting to have a mammogram and dexa scan performed. There are no nursing concerns at this time.   Past Medical History  Diagnosis Date  . Diabetes mellitus   . Hyperthyroidism   . Hypertension   . Perforated ulcer     Past Surgical History  Procedure Laterality Date  . 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 Laparotomy  06/10/2012    Procedure: Omental patch of perforated ulcer, EXPLORATORY LAPAROTOMY;  Surgeon: Odis Hollingshead, MD;  Location: [facility];  Service: General;  Laterality: N/A;  closure of perforated gastric ulcer    VITAL SIGNS BP 128/75 mmHg  Pulse 76  Ht 5\' 2"  (1.575 m)  Wt 149 lb (67.586 kg)  BMI 27.25 kg/m2  SpO2 97%   Outpatient Encounter Prescriptions as of 12/17/2014  Medication Sig  . atorvastatin (LIPITOR) 20 MG tablet Take 20 mg by mouth daily.    . cholecalciferol (VITAMIN D) 1000 UNITS tablet Take 2,000 Units by mouth daily.  . cinacalcet (SENSIPAR) 30 MG tablet Take 30 mg by mouth 2 (two) times daily with a meal.  . febuxostat (ULORIC) 40 MG tablet Take 40 mg by mouth daily.  . pantoprazole (PROTONIX) 40 MG tablet Take 40 mg by mouth daily.   . polyethylene glycol (MIRALAX / GLYCOLAX) packet Take 17 g by mouth daily as needed.   . [DISCONTINUED] albuterol (PROVENTIL HFA;VENTOLIN HFA) 108 (90 BASE) MCG/ACT inhaler Inhale 1 puff into the lungs every 6 (six) hours as needed for wheezing or shortness of breath (For Cough).  . [DISCONTINUED] promethazine (PHENERGAN) 12.5 MG suppository Place 1  suppository (12.5 mg total) rectally every 6 (six) hours as needed for nausea or vomiting. (Patient not taking: Reported on 12/17/2014)     SIGNIFICANT DIAGNOSTIC EXAMS   11-01-14: right hand x-ray: no acute change; degenerative changes present.   LABS REVIEWED:   12-11-13: chol 93; ldl 46; trig 113 08-19-14: wbc 7.8; hgb 10.6; hct 34.9; mcv 89.4; lt 195; glucose 110; bun 34.2 creat 4.13; k+4.7 ;na++148; liver normal albumin 3.4 ca++ 8.6; hgb a1c 6.3  10-08-14: wbc 8.1; hgb 8.7; hct 27.3; mcv 88.8; plt 185; gluocse 103; bun 29; creat 3.19; k+4.8; na++145; liver normal albumin 3.5 11-06-14: uric acid 7.5 sed rate 27  11-19-14: chol 18; ldl 63; trig 147; hdl 26; pth 453.500; uric acid 8.3      ROS Constitutional: Negative for malaise/fatigue.  Respiratory: Negative for cough and shortness of breath.   Cardiovascular: Negative for chest pain, palpitations and leg swelling.  Gastrointestinal: Negative for heartburn, abdominal pain and constipation.  Musculoskeletal: Negative for myalgias, back pain and joint pain.  Skin: Negative.   Neurological: Negative for headaches.  Psychiatric/Behavioral: Negative for depression. The patient is not nervous/anxious.    Physical Exam  Constitutional: She is oriented to person, place, and time. She appears well-developed and well-nourished. No distress.  HENT:  Head: Normocephalic.  Right  Ear: External ear normal.  Left Ear: External ear normal.  Mouth/Throat: Oropharynx is clear and moist.  Eyes: Conjunctivae are normal. Pupils are equal, round, and reactive to light.  Neck: Neck supple. No JVD present. No thyromegaly present.  Cardiovascular: Normal rate, regular rhythm, normal heart sounds and intact distal pulses.   Respiratory: Effort normal and breath sounds normal. No respiratory distress. She has no wheezes.  GI: Soft. Bowel sounds are normal. She exhibits no distension. There is no tenderness.  Genitourinary:  Breast exam done without  abnormalities found.   Musculoskeletal: Normal range of motion. She exhibits no edema.  Left third finger unable to bend possible trigger finger   Neurological: She is alert and oriented to person, place, and time.  Skin: Skin is warm and dry. She is not diaphoretic.  Psychiatric: She has a normal mood and affect.       ASSESSMENT/ PLAN:  1. Dyslipidemia: her last ldl was 63; will continue lipitor 20 mg daily   2. GERD: is stable will continue protonix 40 mg daily   3. Hyperparathyroidism: will change sensipar to 90 mcg daily and will chck pth in one month    4. Constipation: will continue miralax daily as needed  5. Hypertension: she is presently not on medications; will not make changes will monitor her status.   6. Diabetes; her hgb a1c is 6.3; she is presently not taking medications; will not make changes  7. Emphysema: is stable will continue albuterol 2 puffs every 6 hours as needed.   8. Gout: for her recent flare with her chronic renal disease will being urolic 40 mg daily and will monitor her status.   9. CKD stage IV: is without change in status; will continue to monitor her status her creat is 3.19.    Will check hgb a1c; urine for micro-albumin; put on foot doctor and eye doctor list; will set up mammogram; dexa scan and will guaiac stool X3    Ok Edwards NP [facility]  Contact [phone] Monday through Friday 8am- 5pm  After hours call [phone]

## 2014-12-18 DIAGNOSIS — E119 Type 2 diabetes mellitus without complications: Secondary | ICD-10-CM | POA: Diagnosis not present

## 2014-12-18 DIAGNOSIS — E1165 Type 2 diabetes mellitus with hyperglycemia: Secondary | ICD-10-CM | POA: Diagnosis not present

## 2014-12-25 ENCOUNTER — Other Ambulatory Visit: Payer: Self-pay | Admitting: Internal Medicine

## 2014-12-25 DIAGNOSIS — Z1231 Encounter for screening mammogram for malignant neoplasm of breast: Secondary | ICD-10-CM

## 2014-12-30 DIAGNOSIS — I10 Essential (primary) hypertension: Secondary | ICD-10-CM | POA: Diagnosis not present

## 2014-12-30 DIAGNOSIS — E1165 Type 2 diabetes mellitus with hyperglycemia: Secondary | ICD-10-CM | POA: Diagnosis not present

## 2014-12-30 DIAGNOSIS — E039 Hypothyroidism, unspecified: Secondary | ICD-10-CM | POA: Diagnosis not present

## 2014-12-31 DIAGNOSIS — E213 Hyperparathyroidism, unspecified: Secondary | ICD-10-CM | POA: Diagnosis not present

## 2014-12-31 DIAGNOSIS — E119 Type 2 diabetes mellitus without complications: Secondary | ICD-10-CM | POA: Diagnosis not present

## 2014-12-31 DIAGNOSIS — E039 Hypothyroidism, unspecified: Secondary | ICD-10-CM | POA: Diagnosis not present

## 2015-01-08 ENCOUNTER — Ambulatory Visit
Admission: RE | Admit: 2015-01-08 | Discharge: 2015-01-08 | Disposition: A | Discharge status: Home / Self Care | Payer: Medicare Other | Source: Ambulatory Visit | Attending: Internal Medicine | Admitting: Internal Medicine

## 2015-01-08 DIAGNOSIS — Z1231 Encounter for screening mammogram for malignant neoplasm of breast: Secondary | ICD-10-CM

## 2015-01-13 ENCOUNTER — Non-Acute Institutional Stay (SKILLED_NURSING_FACILITY): Payer: Medicare Other | Admitting: Adult Health

## 2015-01-13 DIAGNOSIS — K219 Gastro-esophageal reflux disease without esophagitis: Secondary | ICD-10-CM | POA: Diagnosis not present

## 2015-01-13 DIAGNOSIS — E210 Primary hyperparathyroidism: Secondary | ICD-10-CM

## 2015-01-13 DIAGNOSIS — E119 Type 2 diabetes mellitus without complications: Secondary | ICD-10-CM | POA: Diagnosis not present

## 2015-01-13 DIAGNOSIS — N184 Chronic kidney disease, stage 4 (severe): Secondary | ICD-10-CM | POA: Diagnosis not present

## 2015-01-13 DIAGNOSIS — J439 Emphysema, unspecified: Secondary | ICD-10-CM | POA: Diagnosis not present

## 2015-01-13 DIAGNOSIS — I10 Essential (primary) hypertension: Secondary | ICD-10-CM | POA: Diagnosis not present

## 2015-01-13 DIAGNOSIS — E785 Hyperlipidemia, unspecified: Secondary | ICD-10-CM

## 2015-01-13 DIAGNOSIS — K5901 Slow transit constipation: Secondary | ICD-10-CM

## 2015-01-13 DIAGNOSIS — M1A9XX0 Chronic gout, unspecified, without tophus (tophi): Secondary | ICD-10-CM

## 2015-01-15 DIAGNOSIS — E039 Hypothyroidism, unspecified: Secondary | ICD-10-CM | POA: Diagnosis not present

## 2015-01-18 DIAGNOSIS — E1159 Type 2 diabetes mellitus with other circulatory complications: Secondary | ICD-10-CM | POA: Diagnosis not present

## 2015-01-18 DIAGNOSIS — M79676 Pain in unspecified toe(s): Secondary | ICD-10-CM | POA: Diagnosis not present

## 2015-01-18 DIAGNOSIS — B351 Tinea unguium: Secondary | ICD-10-CM | POA: Diagnosis not present

## 2015-01-18 DIAGNOSIS — R262 Difficulty in walking, not elsewhere classified: Secondary | ICD-10-CM | POA: Diagnosis not present

## 2015-01-18 DIAGNOSIS — G629 Polyneuropathy, unspecified: Secondary | ICD-10-CM | POA: Diagnosis not present

## 2015-02-02 DIAGNOSIS — M810 Age-related osteoporosis without current pathological fracture: Secondary | ICD-10-CM | POA: Diagnosis not present

## 2015-02-18 ENCOUNTER — Non-Acute Institutional Stay (SKILLED_NURSING_FACILITY): Payer: Medicare Other | Admitting: Internal Medicine

## 2015-02-18 ENCOUNTER — Encounter: Payer: Self-pay | Admitting: Internal Medicine

## 2015-02-18 DIAGNOSIS — I10 Essential (primary) hypertension: Secondary | ICD-10-CM | POA: Diagnosis not present

## 2015-02-18 DIAGNOSIS — K219 Gastro-esophageal reflux disease without esophagitis: Secondary | ICD-10-CM | POA: Diagnosis not present

## 2015-02-18 DIAGNOSIS — J439 Emphysema, unspecified: Secondary | ICD-10-CM

## 2015-02-18 DIAGNOSIS — M1A9XX0 Chronic gout, unspecified, without tophus (tophi): Secondary | ICD-10-CM | POA: Diagnosis not present

## 2015-02-18 DIAGNOSIS — N184 Chronic kidney disease, stage 4 (severe): Secondary | ICD-10-CM

## 2015-02-18 DIAGNOSIS — E119 Type 2 diabetes mellitus without complications: Secondary | ICD-10-CM

## 2015-02-18 NOTE — Progress Notes (Signed)
Patient ID: April [name], female   DOB: 14-Mar-1935, 80 y.o.   MRN: [id number]    [place]     Place of Service: SN[facility] [phone]   02/18/15   Allergies  Allergen Reactions  . Nsaids     Pt had perforated ulcer - patched [id number]    Chief Complaint  Patient presents with  . Medical Management of Chronic Issues    HPI:  80 yo female long term resident seen today for f/u. She has no c/o today. She is eating and sleeping well. No nursing issues. No recent falls.  She takes no med for diabetes. It is diet controlled  She had a perforated ulcer and is now taking protonix. No GI sx's currently. Constipation is controlled  lipitor controls cholesterol  She has primary hyperPTHdism and takes sensipar, vitamin D  No gout attacks on uloric  No pulm exacerbations. She does not use HFA or nebs  No hyperthyroid sx's.  Past Medical History  Diagnosis Date  . Diabetes mellitus   . Hyperthyroidism   . Hypertension   . Perforated ulcer    Past Surgical History  Procedure Laterality Date  . 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 Laparotomy  06/10/2012    Procedure: Omental patch of perforated ulcer, EXPLORATORY LAPAROTOMY;  Surgeon: Odis Hollingshead, MD;  Location: [facility];  Service: General;  Laterality: N/A;  closure of perforated gastric ulcer   History   Social History  . Marital Status: Single    Spouse Name: N/A  . Number of Children: N/A  . Years of Education: N/A   Social History Main Topics  . Smoking status: Former Smoker -- 1.00 packs/day for 52 years    Types: Cigarettes    Quit date: 06/14/2012  . Smokeless tobacco: Never Used  . Alcohol Use: No  . Drug Use: No  . Sexual Activity: Not on file   Other Topics Concern  . None   Social History Narrative    Medications: Patient's Medications  New Prescriptions   No medications on file  Previous Medications   ATORVASTATIN (LIPITOR) 20 MG TABLET    Take 20 mg by mouth daily.     CHOLECALCIFEROL (VITAMIN D) 1000 UNITS TABLET    Take 2,000 Units by mouth daily.   CINACALCET (SENSIPAR) 30 MG TABLET    Take 30 mg by mouth 2 (two) times daily with a meal.   FEBUXOSTAT (ULORIC) 40 MG TABLET    Take 40 mg by mouth daily.   PANTOPRAZOLE (PROTONIX) 40 MG TABLET    Take 40 mg by mouth daily.    POLYETHYLENE GLYCOL (MIRALAX / GLYCOLAX) PACKET    Take 17 g by mouth daily as needed.   Modified Medications   No medications on file  Discontinued Medications   No medications on file     Review of Systems  Constitutional: Negative for fever, chills, diaphoresis, activity change, appetite change and fatigue.  HENT: Negative for ear pain and sore throat.   Eyes: Negative for visual disturbance.  Respiratory: Negative for cough, chest tightness and shortness of breath.   Cardiovascular: Negative for chest pain, palpitations and leg swelling.  Gastrointestinal: Negative for nausea, vomiting, abdominal pain, diarrhea, constipation and blood in stool.  Genitourinary: Negative for dysuria.  Musculoskeletal: Negative for arthralgias.  Neurological: Negative for dizziness, tremors, numbness and headaches.  Psychiatric/Behavioral: Negative for sleep disturbance. The patient is not nervous/anxious.     Filed  Vitals:   02/18/15 1535  BP: 134/86  Pulse: 87  Temp: 98.2 F (36.8 C)   There is no weight on file to calculate BMI.  Physical Exam  Constitutional: She is oriented to person, place, and time. She appears well-developed and well-nourished.  HENT:  Mouth/Throat: Oropharynx is clear and moist. No oropharyngeal exudate.  Eyes: Pupils are equal, round, and reactive to light. No scleral icterus.  Neck: Neck supple. No tracheal deviation present. No thyromegaly present.  Cardiovascular: Normal rate, regular rhythm, normal heart sounds and intact distal pulses.  Exam reveals no gallop and no friction rub.   No murmur heard. No LE edema b/l. no calf TTP. No carotid bruit b/l    Pulmonary/Chest: Effort normal and breath sounds normal. No stridor. No respiratory distress. She has no wheezes. She has no rales.  Abdominal: Soft. Bowel sounds are normal. She exhibits no distension and no mass. There is no tenderness. There is no rebound and no guarding.  Lymphadenopathy:    She has no cervical adenopathy.  Neurological: She is alert and oriented to person, place, and time. She has normal reflexes.  Skin: Skin is warm and dry. No rash noted.  Psychiatric: She has a normal mood and affect. Her behavior is normal. Judgment and thought content normal.     Labs reviewed: Nursing Home on 12/17/2014  Component Date Value Ref Range Status  . Hgb A1c MFr Bld 08/19/2014 6.3* 4.0 - 6.0 % Final     Assessment/Plan   ICD-9-CM ICD-10-CM   1. Essential hypertension - controlled 401.9 I10   2. Pulmonary emphysema, unspecified emphysema type - stable 492.8 J43.9   3. Type 2 diabetes mellitus without complication - controlled by diet 250.00 E11.9   4. Chronic kidney disease (CKD), stage IV (severe) - stable 585.4 N18.4   5. Gastroesophageal reflux disease without esophagitis - stable 530.81 K21.9   6.      Chronic gout - stable  --check CBC w diff, BMP and A1c  --TED hose while OOB daily  --keep legs elevated when seated  --Pt is medically stable on current tx plan. Continue current medications as ordered. PT/OT/ST as indicated. Will follow   April Spindel S. Perlie Gold  [facility] [address] [place], [place] [address] [phone] Office (Wednesdays and Fridays 8 AM - 5 PM) [phone] Cell (Monday-Friday 8 AM - 5 PM)

## 2015-02-19 DIAGNOSIS — E039 Hypothyroidism, unspecified: Secondary | ICD-10-CM | POA: Diagnosis not present

## 2015-02-19 DIAGNOSIS — E119 Type 2 diabetes mellitus without complications: Secondary | ICD-10-CM | POA: Diagnosis not present

## 2015-02-19 DIAGNOSIS — D649 Anemia, unspecified: Secondary | ICD-10-CM | POA: Diagnosis not present

## 2015-02-19 DIAGNOSIS — Z79899 Other long term (current) drug therapy: Secondary | ICD-10-CM | POA: Diagnosis not present

## 2015-02-19 NOTE — Progress Notes (Signed)
Patient ID: April [name], female   DOB: 01/04/1935, 80 y.o.   MRN: [id number]  [facility]     Allergies  Allergen Reactions  . Nsaids     Pt had perforated ulcer - patched [id number]       Chief Complaint  Patient presents with  . Medical Management of Chronic Issues    HPI:  She is a long term resident of this facility being seen for the management of her chronic illnesses. Overall her status remains stable. She is not voicing any complaints or concerns. There are no nursing concerns at this time.    Past Medical History  Diagnosis Date  . Diabetes mellitus   . Hyperthyroidism   . Hypertension   . Perforated ulcer     Past Surgical History  Procedure Laterality Date  . 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 Laparotomy  06/10/2012    Procedure: Omental patch of perforated ulcer, EXPLORATORY LAPAROTOMY;  Surgeon: Odis Hollingshead, MD;  Location: [facility];  Service: General;  Laterality: N/A;  closure of perforated gastric ulcer    VITAL SIGNS BP 128/74 mmHg  Pulse 80  Ht 5\' 2"  (1.575 m)  Wt 148 lb (67.132 kg)  BMI 27.06 kg/m2   Outpatient Encounter Prescriptions as of 01/13/2015  Medication Sig  . atorvastatin (LIPITOR) 20 MG tablet Take 20 mg by mouth daily.    . cholecalciferol (VITAMIN D) 1000 UNITS tablet Take 2,000 Units by mouth daily.  . cinacalcet (SENSIPAR) 30 MG tablet Take 30 mg by mouth 2 (two) times daily with a meal.  . febuxostat (ULORIC) 40 MG tablet Take 40 mg by mouth daily.  . pantoprazole (PROTONIX) 40 MG tablet Take 40 mg by mouth daily.   . polyethylene glycol (MIRALAX / GLYCOLAX) packet Take 17 g by mouth daily as needed.      SIGNIFICANT DIAGNOSTIC EXAMS  11-01-14: right hand x-ray: no acute change; degenerative changes present.   01-08-15: mammogram: negative   LABS REVIEWED:   12-11-13: chol 93; ldl 46; trig 113 08-19-14: wbc 7.8; hgb 10.6; hct 34.9; mcv 89.4; lt 195; glucose 110; bun 34.2 creat 4.13; k+4.7 ;na++148; liver normal  albumin 3.4 ca++ 8.6; hgb a1c 6.3  10-08-14: wbc 8.1; hgb 8.7; hct 27.3; mcv 88.8; plt 185; gluocse 103; bun 29; creat 3.19; k+4.8; na++145; liver normal albumin 3.5 11-06-14: uric acid 7.5 sed rate 27  11-19-14: chol 18; ldl 63; trig 147; hdl 26; pth 453.500; uric acid 8.3; vit d 40.24  12-18-14: hgb a1c 6.3 12-30-14: stool guaiac: neg 12-31-14: urine mirco albumin <1.2     ROS Constitutional: Negative for malaise/fatigue.  Respiratory: Negative for cough and shortness of breath.   Cardiovascular: Negative for chest pain, palpitations and leg swelling.  Gastrointestinal: Negative for heartburn, abdominal pain and constipation.  Musculoskeletal: Negative for myalgias, back pain and joint pain.  Skin: Negative.   Neurological: Negative for headaches.  Psychiatric/Behavioral: Negative for depression. The patient is not nervous/anxious.    Physical Exam Constitutional: She is oriented to person, place, and time. She appears well-developed and well-nourished. No distress. .  Neck: Neck supple. No JVD present. No thyromegaly present.  Cardiovascular: Normal rate, regular rhythm, normal heart sounds and intact distal pulses.   Respiratory: Effort normal and breath sounds normal. No respiratory distress. She has no wheezes.  GI: Soft. Bowel sounds are normal. She exhibits no distension. There is no tenderness.    Musculoskeletal: Normal range of motion.  She exhibits no edema.  Left third finger unable to bend possible trigger finger   Neurological: She is alert and oriented to person, place, and time.  Skin: Skin is warm and dry. She is not diaphoretic.  Psychiatric: She has a normal mood and affect.      ASSESSMENT/ PLAN:  1. Dyslipidemia: her last ldl was 63; will continue lipitor 20 mg daily   2. GERD: is stable will continue protonix 40 mg daily   3. Hyperparathyroidism: will change sensipar to 90 mcg daily     4. Constipation: will continue miralax daily as needed  5.  Hypertension: she is presently not on medications; will not make changes will monitor her status.   6. Diabetes; her hgb a1c is 6.3; she is presently not taking medications; will not make changes  7. Emphysema: is stable will continue albuterol 2 puffs every 6 hours as needed.   8. Gout: for her recent flare with her chronic renal disease will being urolic 40 mg daily and will monitor her status.   9. CKD stage IV: is without change in status; will continue to monitor her status her creat is 3.19.      Ok Edwards NP [facility]  Contact [phone] Monday through Friday 8am- 5pm  After hours call [phone]

## 2015-02-20 DIAGNOSIS — E785 Hyperlipidemia, unspecified: Secondary | ICD-10-CM | POA: Diagnosis not present

## 2015-02-20 DIAGNOSIS — I10 Essential (primary) hypertension: Secondary | ICD-10-CM | POA: Diagnosis not present

## 2015-02-20 DIAGNOSIS — E119 Type 2 diabetes mellitus without complications: Secondary | ICD-10-CM | POA: Diagnosis not present

## 2015-02-20 DIAGNOSIS — E039 Hypothyroidism, unspecified: Secondary | ICD-10-CM | POA: Diagnosis not present

## 2015-02-20 DIAGNOSIS — J449 Chronic obstructive pulmonary disease, unspecified: Secondary | ICD-10-CM | POA: Diagnosis not present

## 2015-02-20 DIAGNOSIS — E213 Hyperparathyroidism, unspecified: Secondary | ICD-10-CM | POA: Diagnosis not present

## 2015-02-22 DIAGNOSIS — E785 Hyperlipidemia, unspecified: Secondary | ICD-10-CM | POA: Diagnosis not present

## 2015-02-22 DIAGNOSIS — E039 Hypothyroidism, unspecified: Secondary | ICD-10-CM | POA: Diagnosis not present

## 2015-02-22 DIAGNOSIS — E119 Type 2 diabetes mellitus without complications: Secondary | ICD-10-CM | POA: Diagnosis not present

## 2015-02-22 DIAGNOSIS — I10 Essential (primary) hypertension: Secondary | ICD-10-CM | POA: Diagnosis not present

## 2015-02-22 DIAGNOSIS — J449 Chronic obstructive pulmonary disease, unspecified: Secondary | ICD-10-CM | POA: Diagnosis not present

## 2015-02-22 DIAGNOSIS — E213 Hyperparathyroidism, unspecified: Secondary | ICD-10-CM | POA: Diagnosis not present

## 2015-02-23 DIAGNOSIS — I10 Essential (primary) hypertension: Secondary | ICD-10-CM | POA: Diagnosis not present

## 2015-02-23 DIAGNOSIS — E039 Hypothyroidism, unspecified: Secondary | ICD-10-CM | POA: Diagnosis not present

## 2015-02-23 DIAGNOSIS — E213 Hyperparathyroidism, unspecified: Secondary | ICD-10-CM | POA: Diagnosis not present

## 2015-02-23 DIAGNOSIS — E119 Type 2 diabetes mellitus without complications: Secondary | ICD-10-CM | POA: Diagnosis not present

## 2015-02-23 DIAGNOSIS — E785 Hyperlipidemia, unspecified: Secondary | ICD-10-CM | POA: Diagnosis not present

## 2015-02-23 DIAGNOSIS — J449 Chronic obstructive pulmonary disease, unspecified: Secondary | ICD-10-CM | POA: Diagnosis not present

## 2015-02-24 DIAGNOSIS — E039 Hypothyroidism, unspecified: Secondary | ICD-10-CM | POA: Diagnosis not present

## 2015-02-24 DIAGNOSIS — I10 Essential (primary) hypertension: Secondary | ICD-10-CM | POA: Diagnosis not present

## 2015-02-24 DIAGNOSIS — J449 Chronic obstructive pulmonary disease, unspecified: Secondary | ICD-10-CM | POA: Diagnosis not present

## 2015-02-24 DIAGNOSIS — E785 Hyperlipidemia, unspecified: Secondary | ICD-10-CM | POA: Diagnosis not present

## 2015-02-24 DIAGNOSIS — E119 Type 2 diabetes mellitus without complications: Secondary | ICD-10-CM | POA: Diagnosis not present

## 2015-02-24 DIAGNOSIS — E213 Hyperparathyroidism, unspecified: Secondary | ICD-10-CM | POA: Diagnosis not present

## 2015-02-25 DIAGNOSIS — J449 Chronic obstructive pulmonary disease, unspecified: Secondary | ICD-10-CM | POA: Diagnosis not present

## 2015-02-25 DIAGNOSIS — E785 Hyperlipidemia, unspecified: Secondary | ICD-10-CM | POA: Diagnosis not present

## 2015-02-25 DIAGNOSIS — M810 Age-related osteoporosis without current pathological fracture: Secondary | ICD-10-CM | POA: Diagnosis not present

## 2015-02-25 DIAGNOSIS — I10 Essential (primary) hypertension: Secondary | ICD-10-CM | POA: Diagnosis not present

## 2015-02-25 DIAGNOSIS — E119 Type 2 diabetes mellitus without complications: Secondary | ICD-10-CM | POA: Diagnosis not present

## 2015-02-25 DIAGNOSIS — E039 Hypothyroidism, unspecified: Secondary | ICD-10-CM | POA: Diagnosis not present

## 2015-02-25 DIAGNOSIS — E213 Hyperparathyroidism, unspecified: Secondary | ICD-10-CM | POA: Diagnosis not present

## 2015-02-26 DIAGNOSIS — E119 Type 2 diabetes mellitus without complications: Secondary | ICD-10-CM | POA: Diagnosis not present

## 2015-02-26 DIAGNOSIS — I10 Essential (primary) hypertension: Secondary | ICD-10-CM | POA: Diagnosis not present

## 2015-02-26 DIAGNOSIS — E213 Hyperparathyroidism, unspecified: Secondary | ICD-10-CM | POA: Diagnosis not present

## 2015-02-26 DIAGNOSIS — E785 Hyperlipidemia, unspecified: Secondary | ICD-10-CM | POA: Diagnosis not present

## 2015-02-26 DIAGNOSIS — J449 Chronic obstructive pulmonary disease, unspecified: Secondary | ICD-10-CM | POA: Diagnosis not present

## 2015-02-26 DIAGNOSIS — E039 Hypothyroidism, unspecified: Secondary | ICD-10-CM | POA: Diagnosis not present

## 2015-03-01 DIAGNOSIS — I10 Essential (primary) hypertension: Secondary | ICD-10-CM | POA: Diagnosis not present

## 2015-03-01 DIAGNOSIS — J449 Chronic obstructive pulmonary disease, unspecified: Secondary | ICD-10-CM | POA: Diagnosis not present

## 2015-03-01 DIAGNOSIS — E213 Hyperparathyroidism, unspecified: Secondary | ICD-10-CM | POA: Diagnosis not present

## 2015-03-01 DIAGNOSIS — E039 Hypothyroidism, unspecified: Secondary | ICD-10-CM | POA: Diagnosis not present

## 2015-03-01 DIAGNOSIS — E785 Hyperlipidemia, unspecified: Secondary | ICD-10-CM | POA: Diagnosis not present

## 2015-03-01 DIAGNOSIS — E119 Type 2 diabetes mellitus without complications: Secondary | ICD-10-CM | POA: Diagnosis not present

## 2015-03-03 ENCOUNTER — Encounter: Payer: Self-pay | Admitting: *Deleted

## 2015-03-03 DIAGNOSIS — E213 Hyperparathyroidism, unspecified: Secondary | ICD-10-CM | POA: Diagnosis not present

## 2015-03-03 DIAGNOSIS — J449 Chronic obstructive pulmonary disease, unspecified: Secondary | ICD-10-CM | POA: Diagnosis not present

## 2015-03-03 DIAGNOSIS — I10 Essential (primary) hypertension: Secondary | ICD-10-CM | POA: Diagnosis not present

## 2015-03-03 DIAGNOSIS — E785 Hyperlipidemia, unspecified: Secondary | ICD-10-CM | POA: Diagnosis not present

## 2015-03-03 DIAGNOSIS — E039 Hypothyroidism, unspecified: Secondary | ICD-10-CM | POA: Diagnosis not present

## 2015-03-03 DIAGNOSIS — E119 Type 2 diabetes mellitus without complications: Secondary | ICD-10-CM | POA: Diagnosis not present

## 2015-03-04 DIAGNOSIS — I10 Essential (primary) hypertension: Secondary | ICD-10-CM | POA: Diagnosis not present

## 2015-03-04 DIAGNOSIS — E039 Hypothyroidism, unspecified: Secondary | ICD-10-CM | POA: Diagnosis not present

## 2015-03-04 DIAGNOSIS — E119 Type 2 diabetes mellitus without complications: Secondary | ICD-10-CM | POA: Diagnosis not present

## 2015-03-04 DIAGNOSIS — E785 Hyperlipidemia, unspecified: Secondary | ICD-10-CM | POA: Diagnosis not present

## 2015-03-04 DIAGNOSIS — J449 Chronic obstructive pulmonary disease, unspecified: Secondary | ICD-10-CM | POA: Diagnosis not present

## 2015-03-04 DIAGNOSIS — E213 Hyperparathyroidism, unspecified: Secondary | ICD-10-CM | POA: Diagnosis not present

## 2015-03-05 DIAGNOSIS — E785 Hyperlipidemia, unspecified: Secondary | ICD-10-CM | POA: Diagnosis not present

## 2015-03-05 DIAGNOSIS — J449 Chronic obstructive pulmonary disease, unspecified: Secondary | ICD-10-CM | POA: Diagnosis not present

## 2015-03-05 DIAGNOSIS — I10 Essential (primary) hypertension: Secondary | ICD-10-CM | POA: Diagnosis not present

## 2015-03-05 DIAGNOSIS — E119 Type 2 diabetes mellitus without complications: Secondary | ICD-10-CM | POA: Diagnosis not present

## 2015-03-05 DIAGNOSIS — E039 Hypothyroidism, unspecified: Secondary | ICD-10-CM | POA: Diagnosis not present

## 2015-03-05 DIAGNOSIS — E213 Hyperparathyroidism, unspecified: Secondary | ICD-10-CM | POA: Diagnosis not present

## 2015-03-08 DIAGNOSIS — E785 Hyperlipidemia, unspecified: Secondary | ICD-10-CM | POA: Diagnosis not present

## 2015-03-08 DIAGNOSIS — E119 Type 2 diabetes mellitus without complications: Secondary | ICD-10-CM | POA: Diagnosis not present

## 2015-03-08 DIAGNOSIS — E039 Hypothyroidism, unspecified: Secondary | ICD-10-CM | POA: Diagnosis not present

## 2015-03-08 DIAGNOSIS — I10 Essential (primary) hypertension: Secondary | ICD-10-CM | POA: Diagnosis not present

## 2015-03-08 DIAGNOSIS — J449 Chronic obstructive pulmonary disease, unspecified: Secondary | ICD-10-CM | POA: Diagnosis not present

## 2015-03-08 DIAGNOSIS — E213 Hyperparathyroidism, unspecified: Secondary | ICD-10-CM | POA: Diagnosis not present

## 2015-03-10 DIAGNOSIS — E213 Hyperparathyroidism, unspecified: Secondary | ICD-10-CM | POA: Diagnosis not present

## 2015-03-10 DIAGNOSIS — E785 Hyperlipidemia, unspecified: Secondary | ICD-10-CM | POA: Diagnosis not present

## 2015-03-10 DIAGNOSIS — E039 Hypothyroidism, unspecified: Secondary | ICD-10-CM | POA: Diagnosis not present

## 2015-03-10 DIAGNOSIS — J449 Chronic obstructive pulmonary disease, unspecified: Secondary | ICD-10-CM | POA: Diagnosis not present

## 2015-03-10 DIAGNOSIS — I10 Essential (primary) hypertension: Secondary | ICD-10-CM | POA: Diagnosis not present

## 2015-03-10 DIAGNOSIS — E119 Type 2 diabetes mellitus without complications: Secondary | ICD-10-CM | POA: Diagnosis not present

## 2015-03-11 DIAGNOSIS — E119 Type 2 diabetes mellitus without complications: Secondary | ICD-10-CM | POA: Diagnosis not present

## 2015-03-11 DIAGNOSIS — E785 Hyperlipidemia, unspecified: Secondary | ICD-10-CM | POA: Diagnosis not present

## 2015-03-11 DIAGNOSIS — I10 Essential (primary) hypertension: Secondary | ICD-10-CM | POA: Diagnosis not present

## 2015-03-11 DIAGNOSIS — J449 Chronic obstructive pulmonary disease, unspecified: Secondary | ICD-10-CM | POA: Diagnosis not present

## 2015-03-11 DIAGNOSIS — E039 Hypothyroidism, unspecified: Secondary | ICD-10-CM | POA: Diagnosis not present

## 2015-03-11 DIAGNOSIS — E213 Hyperparathyroidism, unspecified: Secondary | ICD-10-CM | POA: Diagnosis not present

## 2015-03-15 DIAGNOSIS — E785 Hyperlipidemia, unspecified: Secondary | ICD-10-CM | POA: Diagnosis not present

## 2015-03-15 DIAGNOSIS — I10 Essential (primary) hypertension: Secondary | ICD-10-CM | POA: Diagnosis not present

## 2015-03-15 DIAGNOSIS — E119 Type 2 diabetes mellitus without complications: Secondary | ICD-10-CM | POA: Diagnosis not present

## 2015-03-15 DIAGNOSIS — E213 Hyperparathyroidism, unspecified: Secondary | ICD-10-CM | POA: Diagnosis not present

## 2015-03-15 DIAGNOSIS — J449 Chronic obstructive pulmonary disease, unspecified: Secondary | ICD-10-CM | POA: Diagnosis not present

## 2015-03-15 DIAGNOSIS — E039 Hypothyroidism, unspecified: Secondary | ICD-10-CM | POA: Diagnosis not present

## 2015-03-16 DIAGNOSIS — E119 Type 2 diabetes mellitus without complications: Secondary | ICD-10-CM | POA: Diagnosis not present

## 2015-03-16 DIAGNOSIS — I10 Essential (primary) hypertension: Secondary | ICD-10-CM | POA: Diagnosis not present

## 2015-03-16 DIAGNOSIS — E039 Hypothyroidism, unspecified: Secondary | ICD-10-CM | POA: Diagnosis not present

## 2015-03-16 DIAGNOSIS — E785 Hyperlipidemia, unspecified: Secondary | ICD-10-CM | POA: Diagnosis not present

## 2015-03-16 DIAGNOSIS — E213 Hyperparathyroidism, unspecified: Secondary | ICD-10-CM | POA: Diagnosis not present

## 2015-03-16 DIAGNOSIS — J449 Chronic obstructive pulmonary disease, unspecified: Secondary | ICD-10-CM | POA: Diagnosis not present

## 2015-03-17 DIAGNOSIS — E213 Hyperparathyroidism, unspecified: Secondary | ICD-10-CM | POA: Diagnosis not present

## 2015-03-17 DIAGNOSIS — E119 Type 2 diabetes mellitus without complications: Secondary | ICD-10-CM | POA: Diagnosis not present

## 2015-03-17 DIAGNOSIS — E785 Hyperlipidemia, unspecified: Secondary | ICD-10-CM | POA: Diagnosis not present

## 2015-03-17 DIAGNOSIS — J449 Chronic obstructive pulmonary disease, unspecified: Secondary | ICD-10-CM | POA: Diagnosis not present

## 2015-03-17 DIAGNOSIS — I10 Essential (primary) hypertension: Secondary | ICD-10-CM | POA: Diagnosis not present

## 2015-03-17 DIAGNOSIS — E039 Hypothyroidism, unspecified: Secondary | ICD-10-CM | POA: Diagnosis not present

## 2015-03-18 ENCOUNTER — Non-Acute Institutional Stay (SKILLED_NURSING_FACILITY): Payer: Medicare Other | Admitting: Adult Health

## 2015-03-18 DIAGNOSIS — M810 Age-related osteoporosis without current pathological fracture: Secondary | ICD-10-CM | POA: Diagnosis not present

## 2015-03-18 DIAGNOSIS — N184 Chronic kidney disease, stage 4 (severe): Secondary | ICD-10-CM | POA: Diagnosis not present

## 2015-03-18 DIAGNOSIS — E039 Hypothyroidism, unspecified: Secondary | ICD-10-CM | POA: Diagnosis not present

## 2015-03-18 DIAGNOSIS — M1A9XX0 Chronic gout, unspecified, without tophus (tophi): Secondary | ICD-10-CM

## 2015-03-18 DIAGNOSIS — K5901 Slow transit constipation: Secondary | ICD-10-CM

## 2015-03-18 DIAGNOSIS — E785 Hyperlipidemia, unspecified: Secondary | ICD-10-CM

## 2015-03-18 DIAGNOSIS — K219 Gastro-esophageal reflux disease without esophagitis: Secondary | ICD-10-CM

## 2015-03-18 DIAGNOSIS — E213 Hyperparathyroidism, unspecified: Secondary | ICD-10-CM | POA: Diagnosis not present

## 2015-03-18 DIAGNOSIS — I10 Essential (primary) hypertension: Secondary | ICD-10-CM

## 2015-03-18 DIAGNOSIS — E119 Type 2 diabetes mellitus without complications: Secondary | ICD-10-CM | POA: Diagnosis not present

## 2015-03-18 DIAGNOSIS — E210 Primary hyperparathyroidism: Secondary | ICD-10-CM

## 2015-03-18 DIAGNOSIS — J449 Chronic obstructive pulmonary disease, unspecified: Secondary | ICD-10-CM | POA: Diagnosis not present

## 2015-03-18 DIAGNOSIS — J439 Emphysema, unspecified: Secondary | ICD-10-CM

## 2015-03-19 DIAGNOSIS — I10 Essential (primary) hypertension: Secondary | ICD-10-CM | POA: Diagnosis not present

## 2015-03-19 DIAGNOSIS — E213 Hyperparathyroidism, unspecified: Secondary | ICD-10-CM | POA: Diagnosis not present

## 2015-03-19 DIAGNOSIS — J449 Chronic obstructive pulmonary disease, unspecified: Secondary | ICD-10-CM | POA: Diagnosis not present

## 2015-03-19 DIAGNOSIS — E119 Type 2 diabetes mellitus without complications: Secondary | ICD-10-CM | POA: Diagnosis not present

## 2015-03-19 DIAGNOSIS — E039 Hypothyroidism, unspecified: Secondary | ICD-10-CM | POA: Diagnosis not present

## 2015-03-19 DIAGNOSIS — E785 Hyperlipidemia, unspecified: Secondary | ICD-10-CM | POA: Diagnosis not present

## 2015-03-22 DIAGNOSIS — E213 Hyperparathyroidism, unspecified: Secondary | ICD-10-CM | POA: Diagnosis not present

## 2015-03-22 DIAGNOSIS — E119 Type 2 diabetes mellitus without complications: Secondary | ICD-10-CM | POA: Diagnosis not present

## 2015-03-22 DIAGNOSIS — I10 Essential (primary) hypertension: Secondary | ICD-10-CM | POA: Diagnosis not present

## 2015-03-22 DIAGNOSIS — J449 Chronic obstructive pulmonary disease, unspecified: Secondary | ICD-10-CM | POA: Diagnosis not present

## 2015-03-22 DIAGNOSIS — E039 Hypothyroidism, unspecified: Secondary | ICD-10-CM | POA: Diagnosis not present

## 2015-03-22 DIAGNOSIS — E785 Hyperlipidemia, unspecified: Secondary | ICD-10-CM | POA: Diagnosis not present

## 2015-03-24 DIAGNOSIS — I10 Essential (primary) hypertension: Secondary | ICD-10-CM | POA: Diagnosis not present

## 2015-03-24 DIAGNOSIS — J449 Chronic obstructive pulmonary disease, unspecified: Secondary | ICD-10-CM | POA: Diagnosis not present

## 2015-03-24 DIAGNOSIS — E785 Hyperlipidemia, unspecified: Secondary | ICD-10-CM | POA: Diagnosis not present

## 2015-03-24 DIAGNOSIS — E039 Hypothyroidism, unspecified: Secondary | ICD-10-CM | POA: Diagnosis not present

## 2015-03-24 DIAGNOSIS — E213 Hyperparathyroidism, unspecified: Secondary | ICD-10-CM | POA: Diagnosis not present

## 2015-03-24 DIAGNOSIS — E119 Type 2 diabetes mellitus without complications: Secondary | ICD-10-CM | POA: Diagnosis not present

## 2015-03-25 DIAGNOSIS — Z79899 Other long term (current) drug therapy: Secondary | ICD-10-CM | POA: Diagnosis not present

## 2015-03-25 DIAGNOSIS — E119 Type 2 diabetes mellitus without complications: Secondary | ICD-10-CM | POA: Diagnosis not present

## 2015-03-25 DIAGNOSIS — D649 Anemia, unspecified: Secondary | ICD-10-CM | POA: Diagnosis not present

## 2015-03-26 DIAGNOSIS — J449 Chronic obstructive pulmonary disease, unspecified: Secondary | ICD-10-CM | POA: Diagnosis not present

## 2015-03-26 DIAGNOSIS — E039 Hypothyroidism, unspecified: Secondary | ICD-10-CM | POA: Diagnosis not present

## 2015-03-26 DIAGNOSIS — E785 Hyperlipidemia, unspecified: Secondary | ICD-10-CM | POA: Diagnosis not present

## 2015-03-26 DIAGNOSIS — E213 Hyperparathyroidism, unspecified: Secondary | ICD-10-CM | POA: Diagnosis not present

## 2015-03-26 DIAGNOSIS — I10 Essential (primary) hypertension: Secondary | ICD-10-CM | POA: Diagnosis not present

## 2015-03-26 DIAGNOSIS — E119 Type 2 diabetes mellitus without complications: Secondary | ICD-10-CM | POA: Diagnosis not present

## 2015-03-29 DIAGNOSIS — I10 Essential (primary) hypertension: Secondary | ICD-10-CM | POA: Diagnosis not present

## 2015-03-29 DIAGNOSIS — E213 Hyperparathyroidism, unspecified: Secondary | ICD-10-CM | POA: Diagnosis not present

## 2015-03-29 DIAGNOSIS — E039 Hypothyroidism, unspecified: Secondary | ICD-10-CM | POA: Diagnosis not present

## 2015-03-29 DIAGNOSIS — J449 Chronic obstructive pulmonary disease, unspecified: Secondary | ICD-10-CM | POA: Diagnosis not present

## 2015-03-29 DIAGNOSIS — E119 Type 2 diabetes mellitus without complications: Secondary | ICD-10-CM | POA: Diagnosis not present

## 2015-03-29 DIAGNOSIS — E785 Hyperlipidemia, unspecified: Secondary | ICD-10-CM | POA: Diagnosis not present

## 2015-03-31 DIAGNOSIS — J449 Chronic obstructive pulmonary disease, unspecified: Secondary | ICD-10-CM | POA: Diagnosis not present

## 2015-03-31 DIAGNOSIS — E119 Type 2 diabetes mellitus without complications: Secondary | ICD-10-CM | POA: Diagnosis not present

## 2015-03-31 DIAGNOSIS — I10 Essential (primary) hypertension: Secondary | ICD-10-CM | POA: Diagnosis not present

## 2015-03-31 DIAGNOSIS — E785 Hyperlipidemia, unspecified: Secondary | ICD-10-CM | POA: Diagnosis not present

## 2015-03-31 DIAGNOSIS — E039 Hypothyroidism, unspecified: Secondary | ICD-10-CM | POA: Diagnosis not present

## 2015-03-31 DIAGNOSIS — E213 Hyperparathyroidism, unspecified: Secondary | ICD-10-CM | POA: Diagnosis not present

## 2015-04-01 DIAGNOSIS — J449 Chronic obstructive pulmonary disease, unspecified: Secondary | ICD-10-CM | POA: Diagnosis not present

## 2015-04-01 DIAGNOSIS — E119 Type 2 diabetes mellitus without complications: Secondary | ICD-10-CM | POA: Diagnosis not present

## 2015-04-01 DIAGNOSIS — E213 Hyperparathyroidism, unspecified: Secondary | ICD-10-CM | POA: Diagnosis not present

## 2015-04-01 DIAGNOSIS — E785 Hyperlipidemia, unspecified: Secondary | ICD-10-CM | POA: Diagnosis not present

## 2015-04-01 DIAGNOSIS — I10 Essential (primary) hypertension: Secondary | ICD-10-CM | POA: Diagnosis not present

## 2015-04-01 DIAGNOSIS — E039 Hypothyroidism, unspecified: Secondary | ICD-10-CM | POA: Diagnosis not present

## 2015-04-02 DIAGNOSIS — I10 Essential (primary) hypertension: Secondary | ICD-10-CM | POA: Diagnosis not present

## 2015-04-02 DIAGNOSIS — E119 Type 2 diabetes mellitus without complications: Secondary | ICD-10-CM | POA: Diagnosis not present

## 2015-04-02 DIAGNOSIS — E785 Hyperlipidemia, unspecified: Secondary | ICD-10-CM | POA: Diagnosis not present

## 2015-04-02 DIAGNOSIS — J449 Chronic obstructive pulmonary disease, unspecified: Secondary | ICD-10-CM | POA: Diagnosis not present

## 2015-04-02 DIAGNOSIS — E039 Hypothyroidism, unspecified: Secondary | ICD-10-CM | POA: Diagnosis not present

## 2015-04-02 DIAGNOSIS — E213 Hyperparathyroidism, unspecified: Secondary | ICD-10-CM | POA: Diagnosis not present

## 2015-04-05 DIAGNOSIS — E213 Hyperparathyroidism, unspecified: Secondary | ICD-10-CM | POA: Diagnosis not present

## 2015-04-05 DIAGNOSIS — I10 Essential (primary) hypertension: Secondary | ICD-10-CM | POA: Diagnosis not present

## 2015-04-05 DIAGNOSIS — J449 Chronic obstructive pulmonary disease, unspecified: Secondary | ICD-10-CM | POA: Diagnosis not present

## 2015-04-05 DIAGNOSIS — E119 Type 2 diabetes mellitus without complications: Secondary | ICD-10-CM | POA: Diagnosis not present

## 2015-04-05 DIAGNOSIS — E039 Hypothyroidism, unspecified: Secondary | ICD-10-CM | POA: Diagnosis not present

## 2015-04-05 DIAGNOSIS — E785 Hyperlipidemia, unspecified: Secondary | ICD-10-CM | POA: Diagnosis not present

## 2015-04-07 DIAGNOSIS — J449 Chronic obstructive pulmonary disease, unspecified: Secondary | ICD-10-CM | POA: Diagnosis not present

## 2015-04-07 DIAGNOSIS — E785 Hyperlipidemia, unspecified: Secondary | ICD-10-CM | POA: Diagnosis not present

## 2015-04-07 DIAGNOSIS — E119 Type 2 diabetes mellitus without complications: Secondary | ICD-10-CM | POA: Diagnosis not present

## 2015-04-07 DIAGNOSIS — E213 Hyperparathyroidism, unspecified: Secondary | ICD-10-CM | POA: Diagnosis not present

## 2015-04-07 DIAGNOSIS — I10 Essential (primary) hypertension: Secondary | ICD-10-CM | POA: Diagnosis not present

## 2015-04-07 DIAGNOSIS — E039 Hypothyroidism, unspecified: Secondary | ICD-10-CM | POA: Diagnosis not present

## 2015-04-09 DIAGNOSIS — J449 Chronic obstructive pulmonary disease, unspecified: Secondary | ICD-10-CM | POA: Diagnosis not present

## 2015-04-09 DIAGNOSIS — I10 Essential (primary) hypertension: Secondary | ICD-10-CM | POA: Diagnosis not present

## 2015-04-09 DIAGNOSIS — E039 Hypothyroidism, unspecified: Secondary | ICD-10-CM | POA: Diagnosis not present

## 2015-04-09 DIAGNOSIS — E785 Hyperlipidemia, unspecified: Secondary | ICD-10-CM | POA: Diagnosis not present

## 2015-04-09 DIAGNOSIS — E213 Hyperparathyroidism, unspecified: Secondary | ICD-10-CM | POA: Diagnosis not present

## 2015-04-09 DIAGNOSIS — E119 Type 2 diabetes mellitus without complications: Secondary | ICD-10-CM | POA: Diagnosis not present

## 2015-04-12 DIAGNOSIS — J449 Chronic obstructive pulmonary disease, unspecified: Secondary | ICD-10-CM | POA: Diagnosis not present

## 2015-04-12 DIAGNOSIS — I10 Essential (primary) hypertension: Secondary | ICD-10-CM | POA: Diagnosis not present

## 2015-04-12 DIAGNOSIS — E213 Hyperparathyroidism, unspecified: Secondary | ICD-10-CM | POA: Diagnosis not present

## 2015-04-12 DIAGNOSIS — E785 Hyperlipidemia, unspecified: Secondary | ICD-10-CM | POA: Diagnosis not present

## 2015-04-12 DIAGNOSIS — E039 Hypothyroidism, unspecified: Secondary | ICD-10-CM | POA: Diagnosis not present

## 2015-04-12 DIAGNOSIS — E119 Type 2 diabetes mellitus without complications: Secondary | ICD-10-CM | POA: Diagnosis not present

## 2015-04-13 ENCOUNTER — Encounter: Payer: Self-pay | Admitting: Adult Health

## 2015-04-13 DIAGNOSIS — M810 Age-related osteoporosis without current pathological fracture: Secondary | ICD-10-CM | POA: Insufficient documentation

## 2015-04-13 MED ORDER — ALENDRONATE SODIUM 70 MG PO TABS: 70.0000 mg | ORAL_TABLET | ORAL | Status: DC

## 2015-04-13 NOTE — Progress Notes (Signed)
Patient ID: April [name], female   DOB: [birth year], 80 y.o.   MRN: [id number]  [facility]     Allergies  Allergen Reactions  . Nsaids     Pt had perforated ulcer - patched [id number]       Chief Complaint  Patient presents with  . Medical Management of Chronic Issues    HPI:  She is a long term resident of this facility being seen for the management of her chronic illnesses. Overall her status is stable. She is not voicing any complaints or concerns today stating that she is feeling good. There are no nursing concerns at this time.    Past Medical History  Diagnosis Date  . Diabetes mellitus   . Hyperthyroidism   . Hypertension   . Perforated ulcer     Past Surgical History  Procedure Laterality Date  . 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 Laparotomy  06/10/2012    Procedure: Omental patch of perforated ulcer, EXPLORATORY LAPAROTOMY;  Surgeon: Odis Hollingshead, MD;  Location: [facility];  Service: General;  Laterality: N/A;  closure of perforated gastric ulcer    VITAL SIGNS BP 130/70 mmHg  Pulse 76  Ht '5\' 2"'$  (1.575 m)  Wt 148 lb (67.132 kg)  BMI 27.06 kg/m2  SpO2 97%   Outpatient Encounter Prescriptions as of 03/18/2015  Medication Sig  . acetaminophen (TYLENOL) 325 MG tablet Take 650 mg by mouth every 6 (six) hours as needed. For pain  . atorvastatin (LIPITOR) 20 MG tablet Take 20 mg by mouth daily. For hyperlipidemia  . cholecalciferol (VITAMIN D) 1000 UNITS tablet Take 2,000 Units by mouth daily. For Vit D deficiency  . cinacalcet (SENSIPAR) 30 MG tablet Take 90 mg by mouth daily. For hyperparathyroidism  . febuxostat (ULORIC) 40 MG tablet Take 40 mg by mouth daily. For gout  . pantoprazole (PROTONIX) 40 MG tablet Take 40 mg by mouth daily.   . polyethylene glycol (MIRALAX / GLYCOLAX) packet Take 17 g by mouth daily as needed.       SIGNIFICANT DIAGNOSTIC EXAMS  11-01-14: right hand x-ray: no acute change; degenerative changes present.   01-08-15:  mammogram: negative   02-25-15: dexa: t score: -3.0   LABS REVIEWED:   12-11-13: chol 93; ldl 46; trig 113 08-19-14: wbc 7.8; hgb 10.6; hct 34.9; mcv 89.4; lt 195; glucose 110; bun 34.2 creat 4.13; k+4.7 ;na++148; liver normal albumin 3.4 ca++ 8.6; hgb a1c 6.3  10-08-14: wbc 8.1; hgb 8.7; hct 27.3; mcv 88.8; plt 185; gluocse 103; bun 29; creat 3.19; k+4.8; na++145; liver normal albumin 3.5 11-06-14: uric acid 7.5 sed rate 27  11-19-14: chol 18; ldl 63; trig 147; hdl 26; pth 453.500; uric acid 8.3; vit d 40.24  12-18-14: hgb a1c 6.3 12-30-14: stool guaiac: neg 12-31-14: urine mirco albumin <1.2     ROS Constitutional: Negative for malaise/fatigue.  Respiratory: Negative for cough and shortness of breath.   Cardiovascular: Negative for chest pain, palpitations and leg swelling.  Gastrointestinal: Negative for heartburn, abdominal pain and constipation.  Musculoskeletal: Negative for myalgias, back pain and joint pain.  Skin: Negative.   Neurological: Negative for headaches.  Psychiatric/Behavioral: Negative for depression. The patient is not nervous/anxious.     Physical Exam Constitutional: She is oriented to person, place, and time. She appears well-developed and well-nourished. No distress. .  Neck: Neck supple. No JVD present. No thyromegaly present.  Cardiovascular: Normal rate, regular rhythm, normal heart sounds and intact  distal pulses.   Respiratory: Effort normal and breath sounds normal. No respiratory distress. She has no wheezes.  GI: Soft. Bowel sounds are normal. She exhibits no distension. There is no tenderness.    Musculoskeletal: Normal range of motion. She exhibits no edema.   Neurological: She is alert and oriented to person, place, and time.  Skin: Skin is warm and dry. She is not diaphoretic.  Psychiatric: She has a normal mood and affect.      ASSESSMENT/ PLAN:  1. Dyslipidemia: her last ldl was 63; will continue lipitor 20 mg daily   2. GERD: is stable  will continue protonix 40 mg daily   3. Hyperparathyroidism: will continue sensipar  90 mcg daily     4. Constipation: will continue miralax daily as needed  5. Hypertension: she is presently not on medications; will not make changes will monitor her status.   6. Diabetes; her hgb a1c is 6.3; she is presently not taking medications; will not make changes  7. Emphysema: is stable will continue albuterol 2 puffs every 6 hours as needed.   8. Gout: no recent flares: will continue urolic 40 mg daily and will monitor her status.   9. CKD stage IV: is without change in status; will continue to monitor her status her creat is 3.19.   10. Osteoporosis: will begin fosamax 70 mg weekly and ca++ 500 mg twice daily   Will check cbc; cmp; hgb a1c      Ok Edwards NP [facility]  Contact [phone] Monday through Friday 8am- 5pm  After hours call [phone]

## 2015-04-14 DIAGNOSIS — J449 Chronic obstructive pulmonary disease, unspecified: Secondary | ICD-10-CM | POA: Diagnosis not present

## 2015-04-14 DIAGNOSIS — E213 Hyperparathyroidism, unspecified: Secondary | ICD-10-CM | POA: Diagnosis not present

## 2015-04-14 DIAGNOSIS — E119 Type 2 diabetes mellitus without complications: Secondary | ICD-10-CM | POA: Diagnosis not present

## 2015-04-14 DIAGNOSIS — I10 Essential (primary) hypertension: Secondary | ICD-10-CM | POA: Diagnosis not present

## 2015-04-14 DIAGNOSIS — E785 Hyperlipidemia, unspecified: Secondary | ICD-10-CM | POA: Diagnosis not present

## 2015-04-14 DIAGNOSIS — E039 Hypothyroidism, unspecified: Secondary | ICD-10-CM | POA: Diagnosis not present

## 2015-04-15 DIAGNOSIS — E039 Hypothyroidism, unspecified: Secondary | ICD-10-CM | POA: Diagnosis not present

## 2015-04-15 DIAGNOSIS — E213 Hyperparathyroidism, unspecified: Secondary | ICD-10-CM | POA: Diagnosis not present

## 2015-04-15 DIAGNOSIS — E785 Hyperlipidemia, unspecified: Secondary | ICD-10-CM | POA: Diagnosis not present

## 2015-04-15 DIAGNOSIS — I10 Essential (primary) hypertension: Secondary | ICD-10-CM | POA: Diagnosis not present

## 2015-04-15 DIAGNOSIS — E119 Type 2 diabetes mellitus without complications: Secondary | ICD-10-CM | POA: Diagnosis not present

## 2015-04-15 DIAGNOSIS — J449 Chronic obstructive pulmonary disease, unspecified: Secondary | ICD-10-CM | POA: Diagnosis not present

## 2015-04-28 ENCOUNTER — Encounter: Payer: Self-pay | Admitting: Adult Health

## 2015-04-28 ENCOUNTER — Non-Acute Institutional Stay (SKILLED_NURSING_FACILITY): Payer: Medicare Other | Admitting: Adult Health

## 2015-04-28 DIAGNOSIS — E119 Type 2 diabetes mellitus without complications: Secondary | ICD-10-CM | POA: Diagnosis not present

## 2015-04-28 DIAGNOSIS — G4700 Insomnia, unspecified: Secondary | ICD-10-CM | POA: Diagnosis not present

## 2015-04-28 DIAGNOSIS — E785 Hyperlipidemia, unspecified: Secondary | ICD-10-CM

## 2015-04-28 DIAGNOSIS — E210 Primary hyperparathyroidism: Secondary | ICD-10-CM

## 2015-04-28 DIAGNOSIS — J439 Emphysema, unspecified: Secondary | ICD-10-CM | POA: Diagnosis not present

## 2015-04-28 DIAGNOSIS — N184 Chronic kidney disease, stage 4 (severe): Secondary | ICD-10-CM | POA: Diagnosis not present

## 2015-04-28 DIAGNOSIS — K219 Gastro-esophageal reflux disease without esophagitis: Secondary | ICD-10-CM

## 2015-04-28 DIAGNOSIS — I10 Essential (primary) hypertension: Secondary | ICD-10-CM

## 2015-04-28 DIAGNOSIS — K5901 Slow transit constipation: Secondary | ICD-10-CM | POA: Diagnosis not present

## 2015-04-28 DIAGNOSIS — M1A9XX0 Chronic gout, unspecified, without tophus (tophi): Secondary | ICD-10-CM | POA: Diagnosis not present

## 2015-04-28 MED ORDER — MELATONIN 3 MG PO TABS: 3.0000 mg | ORAL_TABLET | Freq: Every day | ORAL | Status: DC

## 2015-04-28 NOTE — Progress Notes (Signed)
Patient ID: April [name], female   DOB: 04-08-1935, 80 y.o.   MRN: [id number]  [facility]     Allergies  Allergen Reactions  . Nsaids     Pt had perforated ulcer - patched [id number]       Chief Complaint  Patient presents with  . Medical Management of Chronic Issues    HPI:  She is a long term resident of this facility being seen for the management of her chronic illnesses. Overall her status remains stable. She tells me that she is doing well except she has difficulty sleeping at night. There are no nursing concerns at this time.    Past Medical History  Diagnosis Date  . Diabetes mellitus   . Hyperthyroidism   . Hypertension   . Perforated ulcer     Past Surgical History  Procedure Laterality Date  . 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 Laparotomy  06/10/2012    Procedure: Omental patch of perforated ulcer, EXPLORATORY LAPAROTOMY;  Surgeon: Odis Hollingshead, MD;  Location: [facility];  Service: General;  Laterality: N/A;  closure of perforated gastric ulcer    VITAL SIGNS BP 122/76 mmHg  Pulse 74  Ht '5\' 2"'$  (1.575 m)  Wt 148 lb (67.132 kg)  BMI 27.06 kg/m2  SpO2 98%   Outpatient Encounter Prescriptions as of 04/28/2015  Medication Sig  . acetaminophen (TYLENOL) 325 MG tablet Take 650 mg by mouth every 6 (six) hours as needed. For pain  . alendronate (FOSAMAX) 70 MG tablet Take 1 tablet (70 mg total) by mouth every 7 (seven) days. Take with a full glass of water on an empty stomach.  Marland Kitchen atorvastatin (LIPITOR) 20 MG tablet Take 20 mg by mouth daily. For hyperlipidemia  . calcium carbonate (TUMS - DOSED IN MG ELEMENTAL CALCIUM) 500 MG chewable tablet Chew 1 tablet by mouth 2 (two) times daily. For supplement  . cholecalciferol (VITAMIN D) 1000 UNITS tablet Take 2,000 Units by mouth daily. For Vit D deficiency  . cinacalcet (SENSIPAR) 30 MG tablet Take 90 mg by mouth daily. For hyperparathyroidism  . febuxostat (ULORIC) 40 MG tablet Take 40 mg by mouth daily. For  gout  . pantoprazole (PROTONIX) 40 MG tablet Take 40 mg by mouth daily.   . polyethylene glycol (MIRALAX / GLYCOLAX) packet Take 17 g by mouth daily as needed.       SIGNIFICANT DIAGNOSTIC EXAMS   11-01-14: right hand x-ray: no acute change; degenerative changes present.   01-08-15: mammogram: negative   02-25-15: dexa: t score: -3.0   LABS REVIEWED:   08-19-14: wbc 7.8; hgb 10.6; hct 34.9; mcv 89.4; lt 195; glucose 110; bun 34.2 creat 4.13; k+4.7 ;na++148; liver normal albumin 3.4 ca++ 8.6; hgb a1c 6.3  10-08-14: wbc 8.1; hgb 8.7; hct 27.3; mcv 88.8; plt 185; gluocse 103; bun 29; creat 3.19; k+4.8; na++145; liver normal albumin 3.5 11-06-14: uric acid 7.5 sed rate 27  11-19-14: chol 18; ldl 63; trig 147; hdl 26; pth 453.500; uric acid 8.3; vit d 40.24  12-18-14: hgb a1c 6.3 12-30-14: stool guaiac: neg 12-31-14: urine mirco albumin <1.2 03-25-15: wbc 9.6; hgb 11.0; hct 35.5; mcv 89.1 ;plt 227; glucose 111; bun 27.6; creat 3.24; k+ 4.2; na++145; hgb a1c 6.1      ROS Constitutional: Negative for malaise/fatigue.  Respiratory: Negative for cough and shortness of breath.   Cardiovascular: Negative for chest pain, palpitations and leg swelling.  Gastrointestinal: Negative for heartburn, abdominal pain and  constipation.  Musculoskeletal: Negative for myalgias, back pain and joint pain.  Skin: Negative.   Neurological: Negative for headaches.  Psychiatric/Behavioral: Negative for depression. The patient is not nervous/anxious. Has insomnia    Physical Exam Constitutional: She is oriented to person, place, and time. She appears well-developed and well-nourished. No distress. .  Neck: Neck supple. No JVD present. No thyromegaly present.  Cardiovascular: Normal rate, regular rhythm, normal heart sounds and intact distal pulses.   Respiratory: Effort normal and breath sounds normal. No respiratory distress. She has no wheezes.  GI: Soft. Bowel sounds are normal. She exhibits no distension.  There is no tenderness.    Musculoskeletal: Normal range of motion. She exhibits no edema.   Neurological: She is alert and oriented to person, place, and time.  Skin: Skin is warm and dry. She is not diaphoretic.  Psychiatric: She has a normal mood and affect.       ASSESSMENT/ PLAN:  1. Dyslipidemia: her last ldl was 63; will continue lipitor 20 mg daily   2. GERD: is stable will continue protonix 40 mg daily   3. Hyperparathyroidism: will continue sensipar  90 mcg daily     4. Constipation: will continue miralax daily as needed  5. Hypertension: she is presently not on medications; will not make changes will monitor her status.   6. Diabetes; her hgb a1c is 6.1; she is presently not taking medications; will not make changes  7. Emphysema: is stable is presently not taking medications; will not make changes will monitor her status.    8. Gout: no recent flares: will continue urolic 40 mg daily and will monitor her status.   9. CKD stage IV: is without change in status; will continue to monitor her status her creat is 3.19.   10. Osteoporosis: will continue fosamax 70 mg weekly and ca++ 500 mg twice daily   11. Insomnia: will begin melatonin 3 mg nightly   Will check pth and lipids   Ok Edwards NP [facility]  Contact [phone] Monday through Friday 8am- 5pm  After hours call [phone]

## 2015-04-29 DIAGNOSIS — E213 Hyperparathyroidism, unspecified: Secondary | ICD-10-CM | POA: Diagnosis not present

## 2015-04-29 DIAGNOSIS — E785 Hyperlipidemia, unspecified: Secondary | ICD-10-CM | POA: Diagnosis not present

## 2015-04-29 DIAGNOSIS — E039 Hypothyroidism, unspecified: Secondary | ICD-10-CM | POA: Diagnosis not present

## 2015-05-25 ENCOUNTER — Non-Acute Institutional Stay (SKILLED_NURSING_FACILITY): Payer: Medicare Other | Admitting: Adult Health

## 2015-05-25 DIAGNOSIS — M1A9XX0 Chronic gout, unspecified, without tophus (tophi): Secondary | ICD-10-CM

## 2015-05-25 DIAGNOSIS — E785 Hyperlipidemia, unspecified: Secondary | ICD-10-CM

## 2015-05-25 DIAGNOSIS — M810 Age-related osteoporosis without current pathological fracture: Secondary | ICD-10-CM

## 2015-05-25 DIAGNOSIS — N184 Chronic kidney disease, stage 4 (severe): Secondary | ICD-10-CM

## 2015-05-25 DIAGNOSIS — I10 Essential (primary) hypertension: Secondary | ICD-10-CM

## 2015-05-25 DIAGNOSIS — E210 Primary hyperparathyroidism: Secondary | ICD-10-CM

## 2015-05-25 DIAGNOSIS — J439 Emphysema, unspecified: Secondary | ICD-10-CM | POA: Diagnosis not present

## 2015-05-27 ENCOUNTER — Non-Acute Institutional Stay (SKILLED_NURSING_FACILITY): Payer: Medicare Other | Admitting: Adult Health

## 2015-05-27 ENCOUNTER — Telehealth: Payer: Self-pay | Admitting: Family Medicine

## 2015-05-27 DIAGNOSIS — J439 Emphysema, unspecified: Secondary | ICD-10-CM

## 2015-05-27 DIAGNOSIS — M1A9XX0 Chronic gout, unspecified, without tophus (tophi): Secondary | ICD-10-CM | POA: Diagnosis not present

## 2015-05-27 DIAGNOSIS — N184 Chronic kidney disease, stage 4 (severe): Secondary | ICD-10-CM | POA: Diagnosis not present

## 2015-05-27 DIAGNOSIS — I10 Essential (primary) hypertension: Secondary | ICD-10-CM

## 2015-05-27 NOTE — Telephone Encounter (Signed)
Been in skilled facility for 3 yrs. [facility] follows her now - on EPIC Is being released home.  appt made with Dr Wendi Snipes 8/4.

## 2015-05-28 DIAGNOSIS — J449 Chronic obstructive pulmonary disease, unspecified: Secondary | ICD-10-CM | POA: Diagnosis not present

## 2015-05-28 DIAGNOSIS — E039 Hypothyroidism, unspecified: Secondary | ICD-10-CM | POA: Diagnosis not present

## 2015-05-28 DIAGNOSIS — I10 Essential (primary) hypertension: Secondary | ICD-10-CM | POA: Diagnosis not present

## 2015-05-28 DIAGNOSIS — E119 Type 2 diabetes mellitus without complications: Secondary | ICD-10-CM | POA: Diagnosis not present

## 2015-05-31 ENCOUNTER — Telehealth: Payer: Self-pay | Admitting: Family Medicine

## 2015-06-01 DIAGNOSIS — E119 Type 2 diabetes mellitus without complications: Secondary | ICD-10-CM | POA: Diagnosis not present

## 2015-06-01 DIAGNOSIS — R262 Difficulty in walking, not elsewhere classified: Secondary | ICD-10-CM | POA: Diagnosis not present

## 2015-06-01 DIAGNOSIS — J449 Chronic obstructive pulmonary disease, unspecified: Secondary | ICD-10-CM | POA: Diagnosis not present

## 2015-06-01 NOTE — Telephone Encounter (Signed)
Patients son found prescriptions and the August 4th appointment is ok.

## 2015-06-04 DIAGNOSIS — R262 Difficulty in walking, not elsewhere classified: Secondary | ICD-10-CM | POA: Diagnosis not present

## 2015-06-04 DIAGNOSIS — E119 Type 2 diabetes mellitus without complications: Secondary | ICD-10-CM | POA: Diagnosis not present

## 2015-06-08 DIAGNOSIS — R262 Difficulty in walking, not elsewhere classified: Secondary | ICD-10-CM | POA: Diagnosis not present

## 2015-06-08 DIAGNOSIS — E119 Type 2 diabetes mellitus without complications: Secondary | ICD-10-CM | POA: Diagnosis not present

## 2015-06-09 DIAGNOSIS — E119 Type 2 diabetes mellitus without complications: Secondary | ICD-10-CM | POA: Diagnosis not present

## 2015-06-09 DIAGNOSIS — R262 Difficulty in walking, not elsewhere classified: Secondary | ICD-10-CM | POA: Diagnosis not present

## 2015-06-10 ENCOUNTER — Encounter (INDEPENDENT_AMBULATORY_CARE_PROVIDER_SITE_OTHER): Payer: Self-pay

## 2015-06-10 ENCOUNTER — Encounter: Payer: Self-pay | Admitting: Family Medicine

## 2015-06-10 ENCOUNTER — Ambulatory Visit (INDEPENDENT_AMBULATORY_CARE_PROVIDER_SITE_OTHER): Payer: Medicare Other | Admitting: Family Medicine

## 2015-06-10 VITALS — BP 134/71 | HR 63 | Temp 97.6°F | Ht 62.0 in | Wt 154.8 lb

## 2015-06-10 DIAGNOSIS — M1A9XX0 Chronic gout, unspecified, without tophus (tophi): Secondary | ICD-10-CM

## 2015-06-10 DIAGNOSIS — K5901 Slow transit constipation: Secondary | ICD-10-CM

## 2015-06-10 DIAGNOSIS — E119 Type 2 diabetes mellitus without complications: Secondary | ICD-10-CM | POA: Diagnosis not present

## 2015-06-10 DIAGNOSIS — K219 Gastro-esophageal reflux disease without esophagitis: Secondary | ICD-10-CM

## 2015-06-10 DIAGNOSIS — G4700 Insomnia, unspecified: Secondary | ICD-10-CM

## 2015-06-10 DIAGNOSIS — H9190 Unspecified hearing loss, unspecified ear: Secondary | ICD-10-CM | POA: Insufficient documentation

## 2015-06-10 DIAGNOSIS — H9193 Unspecified hearing loss, bilateral: Secondary | ICD-10-CM | POA: Diagnosis not present

## 2015-06-10 DIAGNOSIS — N184 Chronic kidney disease, stage 4 (severe): Secondary | ICD-10-CM | POA: Diagnosis not present

## 2015-06-10 DIAGNOSIS — I10 Essential (primary) hypertension: Secondary | ICD-10-CM | POA: Diagnosis not present

## 2015-06-10 DIAGNOSIS — E785 Hyperlipidemia, unspecified: Secondary | ICD-10-CM

## 2015-06-10 DIAGNOSIS — R262 Difficulty in walking, not elsewhere classified: Secondary | ICD-10-CM | POA: Diagnosis not present

## 2015-06-10 DIAGNOSIS — M810 Age-related osteoporosis without current pathological fracture: Secondary | ICD-10-CM

## 2015-06-10 LAB — POCT GLYCOSYLATED HEMOGLOBIN (HGB A1C): Hemoglobin A1C: 6.6

## 2015-06-10 MED ORDER — PANTOPRAZOLE SODIUM 40 MG PO TBEC: 40.0000 mg | DELAYED_RELEASE_TABLET | Freq: Every day | ORAL | Status: DC

## 2015-06-10 MED ORDER — VITAMIN D 1000 UNITS PO TABS: 2000.0000 [IU] | ORAL_TABLET | Freq: Every day | ORAL | Status: AC

## 2015-06-10 MED ORDER — FEBUXOSTAT 40 MG PO TABS: 40.0000 mg | ORAL_TABLET | Freq: Every day | ORAL | Status: DC

## 2015-06-10 MED ORDER — ATORVASTATIN CALCIUM 20 MG PO TABS: 20.0000 mg | ORAL_TABLET | Freq: Every day | ORAL | Status: DC

## 2015-06-10 MED ORDER — CINACALCET HCL 30 MG PO TABS: 90.0000 mg | ORAL_TABLET | Freq: Every day | ORAL | Status: DC

## 2015-06-10 MED ORDER — ALENDRONATE SODIUM 70 MG PO TABS: 70.0000 mg | ORAL_TABLET | ORAL | Status: DC

## 2015-06-10 NOTE — Assessment & Plan Note (Signed)
Bilateral hearing loss, refer to ENT Interested in hearing aids

## 2015-06-10 NOTE — Assessment & Plan Note (Signed)
Well-controlled currently, continue to monitor

## 2015-06-10 NOTE — Assessment & Plan Note (Signed)
Labs to confirm, on phosphate binder Likely needs nephro

## 2015-06-10 NOTE — Progress Notes (Signed)
Patient ID: April [name], female   DOB: Jun 26, 1935, 80 y.o.   MRN: [id number]   HPI  Patient presents today to establish care  She moved out of [facility] on July 25, she now lives with her son and his girlfriend. Today she is present here with her son's girlfriend.  She denies any problems. After long discussion they state that she has a hard time hearing in both ears.  Diabetes Not taking any medications at this time  Currently not taking Lipitor, they are wondering if she needs this. They would like all of her labs checked. They need help getting occupational therapy arranged, she's currently getting home health physical therapy  PMH: Smoking status noted ROS: Per HPI  Denies fever, chills, sweats Denies dyspnea Denies chest pain Denies abdominal pain Positive constipation Otherwise negative  Objective: BP 134/71 mmHg  Pulse 63  Temp(Src) 97.6 F (36.4 C) (Oral)  Ht _0  (1.575 m)  Wt 154 lb 12.8 oz (70.217 kg)  BMI 28.31 kg/m2 Gen: NAD, alert, cooperative with exam, has a hard time hearing HEENT: NCAT, TMs WNL BL, nares clear, oropharynx clear, dentures Neck: Supple, trachea midline, no thyromegaly CV: RRR, good S1/S2, no murmur Resp: CTABL, no wheezes, non-labored Abd: SNTND, BS present, no guarding or organomegaly Ext: No edema, warm Neuro: Alert and oriented, No gross deficits Psychiatric: Normal affect  Assessment and plan:  Osteoporosis Labs, continue Fosamax  Insomnia Asymptomatic currently, discontinue melatonin  HTN (hypertension) Well-controlled currently, continue to monitor  Hearing loss Bilateral hearing loss, refer to ENT Interested in hearing aids  Dyslipidemia Checking labs, continue lipitor due to age and diabetes  DM (diabetes mellitus) A1c 6.6 today, diet controlled Given age will continue statin Labs  Constipation Discussed her miralax dosing Over-the-counter Miralax recommended  Chronic kidney disease  (CKD), stage IV (severe) Labs to confirm, on phosphate binder Likely needs nephro   Chronic gout Labs Continue uloric   Orders Placed This Encounter  Procedures  . Lipid Panel  . CMP14+EGFR  . CBC with Differential  . Vit D  25 hydroxy (rtn osteoporosis monitoring)  . Uric acid  . Ambulatory referral to ENT    Referral Priority:  Routine    Referral Type:  Consultation    Referral Reason:  Specialty Services Required    Requested Specialty:  Otolaryngology    Number of Visits Requested:  1  . POCT glycosylated hemoglobin (Hb A1C)    Meds ordered this encounter  Medications  . alendronate (FOSAMAX) 70 MG tablet    Sig: Take 1 tablet (70 mg total) by mouth every 7 (seven) days. Take with a full glass of water on an empty stomach.    Dispense:  4 tablet    Refill:  11  . atorvastatin (LIPITOR) 20 MG tablet    Sig: Take 1 tablet (20 mg total) by mouth daily. For hyperlipidemia    Dispense:  90 tablet    Refill:  3  . cholecalciferol (VITAMIN D) 1000 UNITS tablet    Sig: Take 2 tablets (2,000 Units total) by mouth daily. For Vit D deficiency    Dispense:  180 tablet    Refill:  3  . cinacalcet (SENSIPAR) 30 MG tablet    Sig: Take 3 tablets (90 mg total) by mouth daily. For hyperparathyroidism    Dispense:  60 tablet    Refill:  3  . febuxostat (ULORIC) 40 MG tablet    Sig: Take 1 tablet (40 mg total)  by mouth daily. For gout    Dispense:  90 tablet    Refill:  3  . pantoprazole (PROTONIX) 40 MG tablet    Sig: Take 1 tablet (40 mg total) by mouth daily.    Dispense:  90 tablet    Refill:  3

## 2015-06-10 NOTE — Assessment & Plan Note (Signed)
Discussed her miralax dosing Over-the-counter Miralax recommended

## 2015-06-10 NOTE — Patient Instructions (Signed)
Great to meet you guys!  Come back in 3 months  We will call about you rlabs

## 2015-06-10 NOTE — Assessment & Plan Note (Signed)
A1c 6.6 today, diet controlled Given age will continue statin Labs

## 2015-06-10 NOTE — Assessment & Plan Note (Signed)
Asymptomatic currently, discontinue melatonin

## 2015-06-10 NOTE — Assessment & Plan Note (Signed)
Labs, continue Fosamax

## 2015-06-10 NOTE — Assessment & Plan Note (Signed)
Labs Continue uloric

## 2015-06-10 NOTE — Assessment & Plan Note (Signed)
Checking labs, continue lipitor due to age and diabetes

## 2015-06-11 ENCOUNTER — Telehealth: Payer: Self-pay | Admitting: Family Medicine

## 2015-06-11 DIAGNOSIS — E119 Type 2 diabetes mellitus without complications: Secondary | ICD-10-CM | POA: Diagnosis not present

## 2015-06-11 DIAGNOSIS — R262 Difficulty in walking, not elsewhere classified: Secondary | ICD-10-CM | POA: Diagnosis not present

## 2015-06-11 DIAGNOSIS — N184 Chronic kidney disease, stage 4 (severe): Secondary | ICD-10-CM

## 2015-06-11 LAB — URIC ACID: URIC ACID: 1.5 mg/dL — AB (ref 2.5–7.1)

## 2015-06-11 LAB — LIPID PANEL
Chol/HDL Ratio: 4 ratio units (ref 0.0–4.4)
Cholesterol, Total: 128 mg/dL (ref 100–199)
HDL: 32 mg/dL — ABNORMAL LOW (ref >39)
LDL Calculated: 62 mg/dL (ref 0–99)
TRIGLYCERIDES: 172 mg/dL — AB (ref 0–149)
VLDL CHOLESTEROL CAL: 34 mg/dL (ref 5–40)

## 2015-06-11 LAB — CMP14+EGFR
ALK PHOS: 77 IU/L (ref 39–117)
ALT: 11 IU/L (ref 0–32)
AST: 11 IU/L (ref 0–40)
Albumin/Globulin Ratio: 1.5 (ref 1.1–2.5)
Albumin: 3.5 g/dL (ref 3.5–4.8)
BUN / CREAT RATIO: 11 (ref 11–26)
BUN: 32 mg/dL — ABNORMAL HIGH (ref 8–27)
Bilirubin Total: 0.4 mg/dL (ref 0.0–1.2)
CALCIUM: 8 mg/dL — AB (ref 8.7–10.3)
CO2: 24 mmol/L (ref 18–29)
Chloride: 106 mmol/L (ref 97–108)
Creatinine, Ser: 2.97 mg/dL — ABNORMAL HIGH (ref 0.57–1.00)
GFR calc non Af Amer: 15 mL/min/{1.73_m2} — ABNORMAL LOW (ref >59)
GFR, EST AFRICAN AMERICAN: 17 mL/min/{1.73_m2} — AB (ref >59)
GLUCOSE: 82 mg/dL (ref 65–99)
Globulin, Total: 2.4 g/dL (ref 1.5–4.5)
Potassium: 4.3 mmol/L (ref 3.5–5.2)
Sodium: 146 mmol/L — ABNORMAL HIGH (ref 134–144)
Total Protein: 5.9 g/dL — ABNORMAL LOW (ref 6.0–8.5)

## 2015-06-11 LAB — CBC WITH DIFFERENTIAL/PLATELET
BASOS ABS: 0 10*3/uL (ref 0.0–0.2)
BASOS: 1 %
EOS (ABSOLUTE): 0.1 10*3/uL (ref 0.0–0.4)
Eos: 2 %
HEMATOCRIT: 31.2 % — AB (ref 34.0–46.6)
Hemoglobin: 10.1 g/dL — ABNORMAL LOW (ref 11.1–15.9)
Immature Grans (Abs): 0.1 10*3/uL (ref 0.0–0.1)
Immature Granulocytes: 2 %
LYMPHS ABS: 2.5 10*3/uL (ref 0.7–3.1)
LYMPHS: 33 %
MCH: 27.9 pg (ref 26.6–33.0)
MCHC: 32.4 g/dL (ref 31.5–35.7)
MCV: 86 fL (ref 79–97)
MONOCYTES: 7 %
Monocytes Absolute: 0.6 10*3/uL (ref 0.1–0.9)
NEUTROS ABS: 4.3 10*3/uL (ref 1.4–7.0)
Neutrophils: 55 %
PLATELETS: 232 10*3/uL (ref 150–379)
RBC: 3.62 x10E6/uL — ABNORMAL LOW (ref 3.77–5.28)
RDW: 14.5 % (ref 12.3–15.4)
WBC: 7.7 10*3/uL (ref 3.4–10.8)

## 2015-06-11 LAB — VITAMIN D 25 HYDROXY (VIT D DEFICIENCY, FRACTURES): VIT D 25 HYDROXY: 40.6 ng/mL (ref 30.0–100.0)

## 2015-06-11 NOTE — Telephone Encounter (Signed)
Called and discussed labs, she has Stage 4 CKD with GFR of 15. She needs nephrology referral.   Laroy Apple, MD [facility] 06/11/2015, 12:26 PM

## 2015-06-15 ENCOUNTER — Telehealth: Payer: Self-pay | Admitting: *Deleted

## 2015-06-15 DIAGNOSIS — E119 Type 2 diabetes mellitus without complications: Secondary | ICD-10-CM | POA: Diagnosis not present

## 2015-06-15 DIAGNOSIS — R262 Difficulty in walking, not elsewhere classified: Secondary | ICD-10-CM | POA: Insufficient documentation

## 2015-06-15 NOTE — Telephone Encounter (Signed)
Order written for Dx difficulty walking. If more/more exact diagnosis needed to justify we will need to visit and discuss the particulars.   Laroy Apple, MD [facility] 06/15/2015, 5:06 PM

## 2015-06-15 NOTE — Telephone Encounter (Signed)
Pt needs a 4 wheeled walker with seat. [facility] to [facility]

## 2015-06-16 DIAGNOSIS — J449 Chronic obstructive pulmonary disease, unspecified: Secondary | ICD-10-CM | POA: Diagnosis not present

## 2015-06-16 DIAGNOSIS — M810 Age-related osteoporosis without current pathological fracture: Secondary | ICD-10-CM | POA: Diagnosis not present

## 2015-06-16 DIAGNOSIS — I10 Essential (primary) hypertension: Secondary | ICD-10-CM | POA: Diagnosis not present

## 2015-06-16 DIAGNOSIS — E119 Type 2 diabetes mellitus without complications: Secondary | ICD-10-CM | POA: Diagnosis not present

## 2015-06-16 DIAGNOSIS — E039 Hypothyroidism, unspecified: Secondary | ICD-10-CM | POA: Diagnosis not present

## 2015-06-16 NOTE — Telephone Encounter (Signed)
[facility] to [facility]

## 2015-06-17 DIAGNOSIS — R262 Difficulty in walking, not elsewhere classified: Secondary | ICD-10-CM | POA: Diagnosis not present

## 2015-06-17 DIAGNOSIS — E119 Type 2 diabetes mellitus without complications: Secondary | ICD-10-CM | POA: Diagnosis not present

## 2015-06-18 DIAGNOSIS — E119 Type 2 diabetes mellitus without complications: Secondary | ICD-10-CM | POA: Diagnosis not present

## 2015-06-18 DIAGNOSIS — R262 Difficulty in walking, not elsewhere classified: Secondary | ICD-10-CM | POA: Diagnosis not present

## 2015-06-22 ENCOUNTER — Telehealth: Payer: Self-pay | Admitting: Family Medicine

## 2015-06-22 DIAGNOSIS — E119 Type 2 diabetes mellitus without complications: Secondary | ICD-10-CM | POA: Diagnosis not present

## 2015-06-22 DIAGNOSIS — R262 Difficulty in walking, not elsewhere classified: Secondary | ICD-10-CM | POA: Diagnosis not present

## 2015-06-22 NOTE — Telephone Encounter (Signed)
Patient got order

## 2015-06-24 DIAGNOSIS — R262 Difficulty in walking, not elsewhere classified: Secondary | ICD-10-CM | POA: Diagnosis not present

## 2015-06-24 DIAGNOSIS — E119 Type 2 diabetes mellitus without complications: Secondary | ICD-10-CM | POA: Diagnosis not present

## 2015-06-25 DIAGNOSIS — R262 Difficulty in walking, not elsewhere classified: Secondary | ICD-10-CM | POA: Diagnosis not present

## 2015-06-25 DIAGNOSIS — E119 Type 2 diabetes mellitus without complications: Secondary | ICD-10-CM | POA: Diagnosis not present

## 2015-06-28 DIAGNOSIS — J449 Chronic obstructive pulmonary disease, unspecified: Secondary | ICD-10-CM | POA: Diagnosis not present

## 2015-07-01 DIAGNOSIS — H9113 Presbycusis, bilateral: Secondary | ICD-10-CM | POA: Diagnosis not present

## 2015-07-01 DIAGNOSIS — H903 Sensorineural hearing loss, bilateral: Secondary | ICD-10-CM | POA: Diagnosis not present

## 2015-07-02 ENCOUNTER — Other Ambulatory Visit: Payer: Self-pay | Admitting: Adult Health

## 2015-07-07 ENCOUNTER — Ambulatory Visit (INDEPENDENT_AMBULATORY_CARE_PROVIDER_SITE_OTHER): Payer: Medicare Other | Admitting: Family Medicine

## 2015-07-07 DIAGNOSIS — R262 Difficulty in walking, not elsewhere classified: Secondary | ICD-10-CM

## 2015-07-07 DIAGNOSIS — E119 Type 2 diabetes mellitus without complications: Secondary | ICD-10-CM

## 2015-07-22 NOTE — Progress Notes (Signed)
Patient ID: April [name], female   DOB: 08/29/1935, 80 y.o.   MRN: [id number]    Facility: [facility]      Allergies  Allergen Reactions  . Nsaids     Pt had perforated ulcer - patched [id number]    Chief Complaint  Patient presents with  . Medical Management of Chronic Issues    HPI:  She is a long term resident of this facility being seen for the management of her chronic illnesses. Overall her status is stable. She is not voicing any complaints or concerns at this time. There are no nursing concerns at this time.    Past Medical History  Diagnosis Date  . Diabetes mellitus   . Hyperthyroidism   . Hypertension   . Perforated ulcer     Past Surgical History  Procedure Laterality Date  . 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 Laparotomy  06/10/2012    Procedure: Omental patch of perforated ulcer, EXPLORATORY LAPAROTOMY;  Surgeon: Odis Hollingshead, MD;  Location: [facility];  Service: General;  Laterality: N/A;  closure of perforated gastric ulcer    VITAL SIGNS BP 118/76 mmHg  Pulse 72  Ht '5\' 2"'$  (1.575 m)  Wt 148 lb (67.132 kg)  BMI 27.06 kg/m2  SpO2 98%  Patient's Medications  New Prescriptions   No medications on file  Previous Medications   ALENDRONATE (FOSAMAX) 70 MG TABLET    Take 1 tablet (70 mg total) by mouth every 7 (seven) days. Take with a full glass of water on an empty stomach.   ATORVASTATIN (LIPITOR) 20 MG TABLET    Take 1 tablet (20 mg total) by mouth daily. For hyperlipidemia   CALCIUM CARBONATE (TUMS - DOSED IN MG ELEMENTAL CALCIUM) 500 MG CHEWABLE TABLET    Chew 1 tablet by mouth 2 (two) times daily. For supplement   CHOLECALCIFEROL (VITAMIN D) 1000 UNITS TABLET    Take 2 tablets (2,000 Units total) by mouth daily. For Vit D deficiency   CINACALCET (SENSIPAR) 30 MG TABLET    Take 3 tablets (90 mg total) by mouth daily. For hyperparathyroidism   FEBUXOSTAT (ULORIC) 40 MG TABLET    Take 1 tablet (40 mg total) by mouth daily. For gout     PANTOPRAZOLE (PROTONIX) 40 MG TABLET    Take 1 tablet (40 mg total) by mouth daily.   POLYETHYLENE GLYCOL (MIRALAX / GLYCOLAX) PACKET    Take 17 g by mouth daily as needed.   Modified Medications   No medications on file  Discontinued Medications   No medications on file     SIGNIFICANT DIAGNOSTIC EXAMS   11-01-14: right hand x-ray: no acute change; degenerative changes present.   01-08-15: mammogram: negative   02-25-15: dexa: t score: -3.0   LABS REVIEWED:   08-19-14: wbc 7.8; hgb 10.6; hct 34.9; mcv 89.4; lt 195; glucose 110; bun 34.2 creat 4.13; k+4.7 ;na++148; liver normal albumin 3.4 ca++ 8.6; hgb a1c 6.3  10-08-14: wbc 8.1; hgb 8.7; hct 27.3; mcv 88.8; plt 185; gluocse 103; bun 29; creat 3.19; k+4.8; na++145; liver normal albumin 3.5 11-06-14: uric acid 7.5 sed rate 27  11-19-14: chol 18; ldl 63; trig 147; hdl 26; pth 453.500; uric acid 8.3; vit d 40.24  12-18-14: hgb a1c 6.3 12-30-14: stool guaiac: neg 12-31-14: urine mirco albumin <1.2 03-25-15: wbc 9.6; hgb 11.0; hct 35.5; mcv 89.1 ;plt 227; glucose 111; bun 27.6; creat 3.24; k+ 4.2; na++145; hgb a1c  6.1 04-29-15: chol 118; ldl 60; trig 154; hdl 27; pth 472.6 ca++ 9.7        Review of Systems Constitutional: Negative for malaise/fatigue.  Respiratory: Negative for cough and shortness of breath.   Cardiovascular: Negative for chest pain, palpitations and leg swelling.  Gastrointestinal: Negative for heartburn, abdominal pain and constipation.  Musculoskeletal: Negative for myalgias, back pain and joint pain.  Skin: Negative.   Neurological: Negative for headaches.  Psychiatric/Behavioral: Negative for depression. The patient is not nervous/anxious.   Physical Exam Constitutional: She is oriented to person, place, and time. She appears well-developed and well-nourished. No distress. .  Neck: Neck supple. No JVD present. No thyromegaly present.  Cardiovascular: Normal rate, regular rhythm, normal heart sounds and intact  distal pulses.   Respiratory: Effort normal and breath sounds normal. No respiratory distress. She has no wheezes.  GI: Soft. Bowel sounds are normal. She exhibits no distension. There is no tenderness.    Musculoskeletal: Normal range of motion. She exhibits no edema.   Neurological: She is alert and oriented to person, place, and time.  Skin: Skin is warm and dry. She is not diaphoretic.  Psychiatric: She has a normal mood and affect.     ASSESSMENT/ PLAN:  1. Dyslipidemia: her last ldl was 60; will continue lipitor 20 mg daily   2. GERD: is stable will continue protonix 40 mg daily   3. Hyperparathyroidism: will continue sensipar  90 mcg daily   pth is 472.6   4. Constipation: will continue miralax daily as needed  5. Hypertension: she is presently not on medications; will not make changes will monitor her status.   6. Diabetes; her hgb a1c is 6.1; she is presently not taking medications; will not make changes  7. Emphysema: is stable is presently not taking medications; will not make changes will monitor her status.    8. Gout: no recent flares: will continue urolic 40 mg daily and will monitor her status.   9. CKD stage IV: is without change in status; will continue to monitor her status her creat is 3.19.   10. Osteoporosis: will continue fosamax 70 mg weekly and ca++ 500 mg twice daily      Ok Edwards NP [facility]  Contact [phone] Monday through Friday 8am- 5pm  After hours call [phone]

## 2015-07-26 DIAGNOSIS — H903 Sensorineural hearing loss, bilateral: Secondary | ICD-10-CM | POA: Diagnosis not present

## 2015-07-29 ENCOUNTER — Encounter: Payer: Self-pay | Admitting: Adult Health

## 2015-07-29 DIAGNOSIS — J449 Chronic obstructive pulmonary disease, unspecified: Secondary | ICD-10-CM | POA: Diagnosis not present

## 2015-07-29 NOTE — Progress Notes (Signed)
Patient ID: April [name], female   DOB: 13-Apr-1935, 80 y.o.   MRN: [id number]    Facility: [facility]      Allergies  Allergen Reactions  . Nsaids     Pt had perforated ulcer - patched [id number]    Chief Complaint  Patient presents with  . Discharge Note    HPI:  She is being discharged to home with her family. She will need home health for pt/ot/cna. She will need her prescriptions to be written and will need to follow up with her pcp. She will need a hospital bed; wheelchair with cushion; shower bench and 3:1 commode.    Past Medical History  Diagnosis Date  . Diabetes mellitus   . Hyperthyroidism   . Hypertension   . Perforated ulcer     Past Surgical History  Procedure Laterality Date  . 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 Laparotomy  06/10/2012    Procedure: Omental patch of perforated ulcer, EXPLORATORY LAPAROTOMY;  Surgeon: Odis Hollingshead, MD;  Location: [facility];  Service: General;  Laterality: N/A;  closure of perforated gastric ulcer    VITAL SIGNS BP 132/70 mmHg  Pulse 79  Ht '5\' 2"'$  (1.575 m)  Wt 148 lb (67.132 kg)  BMI 27.06 kg/m2  Patient's Medications  New Prescriptions   No medications on file  Previous Medications   ALENDRONATE (FOSAMAX) 70 MG TABLET    Take 1 tablet (70 mg total) by mouth every 7 (seven) days. Take with a full glass of water on an empty stomach.   ATORVASTATIN (LIPITOR) 20 MG TABLET    Take 1 tablet (20 mg total) by mouth daily. For hyperlipidemia   CALCIUM CARBONATE (TUMS - DOSED IN MG ELEMENTAL CALCIUM) 500 MG CHEWABLE TABLET    Chew 1 tablet by mouth 2 (two) times daily. For supplement   CHOLECALCIFEROL (VITAMIN D) 1000 UNITS TABLET    Take 2 tablets (2,000 Units total) by mouth daily. For Vit D deficiency   CINACALCET (SENSIPAR) 30 MG TABLET    Take 3 tablets (90 mg total) by mouth daily. For hyperparathyroidism   FEBUXOSTAT (ULORIC) 40 MG TABLET    Take 1 tablet (40 mg total) by mouth daily. For gout   PANTOPRAZOLE (PROTONIX) 40 MG TABLET    Take 1 tablet (40 mg total) by mouth daily.   POLYETHYLENE GLYCOL (MIRALAX / GLYCOLAX) PACKET    Take 17 g by mouth daily as needed.   Modified Medications   No medications on file  Discontinued Medications   No medications on file     SIGNIFICANT DIAGNOSTIC EXAMS   11-01-14: right hand x-ray: no acute change; degenerative changes present.   01-08-15: mammogram: negative   02-25-15: dexa: t score: -3.0   LABS REVIEWED:   08-19-14: wbc 7.8; hgb 10.6; hct 34.9; mcv 89.4; lt 195; glucose 110; bun 34.2 creat 4.13; k+4.7 ;na++148; liver normal albumin 3.4 ca++ 8.6; hgb a1c 6.3  10-08-14: wbc 8.1; hgb 8.7; hct 27.3; mcv 88.8; plt 185; gluocse 103; bun 29; creat 3.19; k+4.8; na++145; liver normal albumin 3.5 11-06-14: uric acid 7.5 sed rate 27  11-19-14: chol 18; ldl 63; trig 147; hdl 26; pth 453.500; uric acid 8.3; vit d 40.24  12-18-14: hgb a1c 6.3 12-30-14: stool guaiac: neg 12-31-14: urine mirco albumin <1.2 03-25-15: wbc 9.6; hgb 11.0; hct 35.5; mcv 89.1 ;plt 227; glucose 111; bun 27.6; creat 3.24; k+ 4.2; na++145; hgb a1c 6.1 04-29-15: chol  118; ldl 60; trig 154; hdl 27; pth 472.6 ca++ 9.7      Review of Systems Constitutional: Negative for malaise/fatigue.  Respiratory: Negative for cough and shortness of breath.   Cardiovascular: Negative for chest pain, palpitations and leg swelling.  Gastrointestinal: Negative for heartburn, abdominal pain and constipation.  Musculoskeletal: Negative for myalgias, back pain and joint pain.  Skin: Negative.   Neurological: Negative for headaches.  Psychiatric/Behavioral: Negative for depression. The patient is not nervous/anxious.   Physical Exam Constitutional: She is oriented to person, place, and time. She appears well-developed and well-nourished. No distress. .  Neck: Neck supple. No JVD present. No thyromegaly present.  Cardiovascular: Normal rate, regular rhythm, normal heart sounds and intact distal  pulses.   Respiratory: Effort normal and breath sounds normal. No respiratory distress. She has no wheezes.  GI: Soft. Bowel sounds are normal. She exhibits no distension. There is no tenderness.    Musculoskeletal: Normal range of motion. She exhibits no edema.   Neurological: She is alert and oriented to person, place, and time.  Skin: Skin is warm and dry. She is not diaphoretic.  Psychiatric: She has a normal mood and affect.     ASSESSMENT/ PLAN:  Will discharge her to home with home health for pt/ot/cna to evaluate and treat as indicated for gait; strength; adl training and adl care. She will need a hospital bed as she cannot tolerate the head of her bed below 30 degrees; which cannot be achieved in a regular bed. She will need a standard wheelchair with a cushion in order to maintain her current level of independence with her adls' which cannot be achieved with a walker; she can self propel. She will ned a shower bench and 3;1 commode. Her prescriptions have been written for a 30 day supply of her medications. She has a follow up appointment with Dr. Laroy Apple on 06-10-15 at 1:55 pm.     Time spent with patient   50  minutes >50% time spent counseling; reviewing medical record; tests; labs; and developing future plan of care    Ok Edwards NP [facility]  Contact [phone] Monday through Friday 8am- 5pm  After hours call [phone]

## 2015-08-10 ENCOUNTER — Telehealth: Payer: Self-pay

## 2015-08-10 NOTE — Telephone Encounter (Signed)
Medicaid non preferred Uloric  Preferred are : oxybutynin tablet, Toviaz tablet and Vesicare tablet

## 2015-08-10 NOTE — Telephone Encounter (Signed)
Pt's caregiver aware.

## 2015-08-10 NOTE — Telephone Encounter (Signed)
Uloric approved through 11/05/16

## 2015-08-10 NOTE — Telephone Encounter (Signed)
Uloric is being used for chroinic gout with CKD stage 4.  Will see if we can prior- auth.   Laroy Apple, MD [facility] 08/10/2015, 9:46 AM

## 2015-08-11 ENCOUNTER — Telehealth: Payer: Self-pay | Admitting: Family Medicine

## 2015-08-12 MED ORDER — CINACALCET HCL 30 MG PO TABS: 90.0000 mg | ORAL_TABLET | Freq: Every day | ORAL | Status: DC

## 2015-08-12 NOTE — Telephone Encounter (Signed)
Pt caregiver aware to have pharm fax prior approval form to our office.

## 2015-08-12 NOTE — Telephone Encounter (Signed)
Dr Wendi Snipes  Did you mean to do BID or TID - it was #60 - should it be #90 This may be why ins is not wanting to pay.

## 2015-08-12 NOTE — Telephone Encounter (Signed)
Stp's caregiver and advised rx sent over with #90 to see if maybe insurance will pay now.

## 2015-08-12 NOTE — Telephone Encounter (Signed)
Sent # 66, will ask nursing to inform.   Laroy Apple, MD [facility] 08/12/2015, 11:07 AM

## 2015-08-17 ENCOUNTER — Telehealth: Payer: Self-pay

## 2015-08-17 NOTE — Telephone Encounter (Signed)
Insurance prior authorized Sensipar through 11/05/16

## 2015-08-28 DIAGNOSIS — J449 Chronic obstructive pulmonary disease, unspecified: Secondary | ICD-10-CM | POA: Diagnosis not present

## 2015-08-30 DIAGNOSIS — K259 Gastric ulcer, unspecified as acute or chronic, without hemorrhage or perforation: Secondary | ICD-10-CM | POA: Diagnosis not present

## 2015-08-30 DIAGNOSIS — D352 Benign neoplasm of pituitary gland: Secondary | ICD-10-CM | POA: Diagnosis not present

## 2015-08-30 DIAGNOSIS — E213 Hyperparathyroidism, unspecified: Secondary | ICD-10-CM | POA: Diagnosis not present

## 2015-08-30 DIAGNOSIS — N184 Chronic kidney disease, stage 4 (severe): Secondary | ICD-10-CM | POA: Diagnosis not present

## 2015-09-10 ENCOUNTER — Encounter: Payer: Self-pay | Admitting: Family Medicine

## 2015-09-10 ENCOUNTER — Ambulatory Visit (INDEPENDENT_AMBULATORY_CARE_PROVIDER_SITE_OTHER): Payer: Medicare Other | Admitting: Family Medicine

## 2015-09-10 VITALS — BP 140/68 | HR 60 | Temp 97.1°F | Ht 62.0 in | Wt 155.0 lb

## 2015-09-10 DIAGNOSIS — N184 Chronic kidney disease, stage 4 (severe): Secondary | ICD-10-CM

## 2015-09-10 DIAGNOSIS — Z23 Encounter for immunization: Secondary | ICD-10-CM

## 2015-09-10 DIAGNOSIS — E119 Type 2 diabetes mellitus without complications: Secondary | ICD-10-CM

## 2015-09-10 DIAGNOSIS — I10 Essential (primary) hypertension: Secondary | ICD-10-CM

## 2015-09-10 LAB — POCT UA - MICROALBUMIN: Microalbumin Ur, POC: NEGATIVE mg/L

## 2015-09-10 LAB — POCT GLYCOSYLATED HEMOGLOBIN (HGB A1C): HEMOGLOBIN A1C: 6.9

## 2015-09-10 MED ORDER — ATORVASTATIN CALCIUM 20 MG PO TABS: 20.0000 mg | ORAL_TABLET | Freq: Every day | ORAL | Status: DC

## 2015-09-10 NOTE — Progress Notes (Signed)
   HPI  Patient presents today for follow-up for diabetes, hypertension  She would like a handicap placard. She rides in a wheelchair or has the help of people in each arm where she goes. At home she uses a her.  Hypertension No chest pain, dyspnea, palpitations, leg edema. Has stage IV chronic kidney disease She is on no medications for blood pressure  Diabetes Diet controlled Would like referral to ophthalmology No neuropathy  Needs a flu shot  PMH: Smoking status noted ROS: Per HPI  Objective: BP 140/68 mmHg  Pulse 60  Temp(Src) 97.1 F (36.2 C) (Oral)  Ht '5\' 2"'$  (1.575 m)  Wt 155 lb (70.308 kg)  BMI 28.34 kg/m2 Gen: NAD, alert, cooperative with exam HEENT: NCAT, sclera white, oropharynx clear, TMs normal bilaterally CV: RRR, good S1/S2, no murmur Resp: CTABL, no wheezes, non-labored Ext: No edema, warm Neuro: Alert and oriented, sitting in a wheelchair  Foot exam: 2+ dorsalis pedis pulses, sensation intact to monofilament  Assessment and plan:  # Diabetes Diet controlled, every 6 month A1c's Last A1c 6.6 Foot exam normal, family denies lesions, she had long stockings onset only pulses and sensation were tested. Ophthalmology referral  # HTN Reasonably well controlled No meds Labs per nephrology  # Mobility difficulty, difficulty walking Handicap placard paperwork filled out today    Orders Placed This Encounter  Procedures  . Ambulatory referral to Ophthalmology    Referral Priority:  Routine    Referral Type:  Consultation    Referral Reason:  Specialty Services Required    Requested Specialty:  Ophthalmology    Number of Visits Requested:  1  . POCT glycosylated hemoglobin (Hb A1C)  . POCT UA - [employer], MD [facility] 09/10/2015, 2:21 PM

## 2015-09-10 NOTE — Patient Instructions (Addendum)
Great to see you!  Come back in 3 months for diabetes follow up.   Come back sooner if you need anything else.

## 2015-09-28 DIAGNOSIS — J449 Chronic obstructive pulmonary disease, unspecified: Secondary | ICD-10-CM | POA: Diagnosis not present

## 2015-10-05 DIAGNOSIS — H353130 Nonexudative age-related macular degeneration, bilateral, stage unspecified: Secondary | ICD-10-CM | POA: Diagnosis not present

## 2015-10-05 DIAGNOSIS — H02042 Spastic entropion of right lower eyelid: Secondary | ICD-10-CM | POA: Diagnosis not present

## 2015-10-05 DIAGNOSIS — H02831 Dermatochalasis of right upper eyelid: Secondary | ICD-10-CM | POA: Diagnosis not present

## 2015-10-05 LAB — HM DIABETES EYE EXAM

## 2015-10-11 ENCOUNTER — Encounter: Payer: Self-pay | Admitting: *Deleted

## 2015-10-13 DIAGNOSIS — D352 Benign neoplasm of pituitary gland: Secondary | ICD-10-CM | POA: Diagnosis not present

## 2015-10-13 DIAGNOSIS — K259 Gastric ulcer, unspecified as acute or chronic, without hemorrhage or perforation: Secondary | ICD-10-CM | POA: Diagnosis not present

## 2015-10-13 DIAGNOSIS — E119 Type 2 diabetes mellitus without complications: Secondary | ICD-10-CM | POA: Diagnosis not present

## 2015-10-13 DIAGNOSIS — N184 Chronic kidney disease, stage 4 (severe): Secondary | ICD-10-CM | POA: Diagnosis not present

## 2015-10-13 DIAGNOSIS — I10 Essential (primary) hypertension: Secondary | ICD-10-CM | POA: Diagnosis not present

## 2015-10-15 ENCOUNTER — Other Ambulatory Visit: Payer: Self-pay | Admitting: Nephrology

## 2015-10-15 ENCOUNTER — Other Ambulatory Visit: Payer: Self-pay | Admitting: Family Medicine

## 2015-10-15 DIAGNOSIS — D352 Benign neoplasm of pituitary gland: Secondary | ICD-10-CM

## 2015-10-15 DIAGNOSIS — K8689 Other specified diseases of pancreas: Secondary | ICD-10-CM

## 2015-10-25 ENCOUNTER — Ambulatory Visit
Admission: RE | Admit: 2015-10-25 | Discharge: 2015-10-25 | Disposition: A | Discharge status: Home / Self Care | Payer: Medicare Other | Source: Ambulatory Visit | Attending: Nephrology | Admitting: Nephrology

## 2015-10-25 ENCOUNTER — Encounter: Payer: Self-pay | Admitting: Family Medicine

## 2015-10-25 DIAGNOSIS — K8689 Other specified diseases of pancreas: Secondary | ICD-10-CM | POA: Diagnosis not present

## 2015-10-25 DIAGNOSIS — D352 Benign neoplasm of pituitary gland: Secondary | ICD-10-CM | POA: Diagnosis not present

## 2015-10-28 DIAGNOSIS — J449 Chronic obstructive pulmonary disease, unspecified: Secondary | ICD-10-CM | POA: Diagnosis not present

## 2015-11-28 DIAGNOSIS — J449 Chronic obstructive pulmonary disease, unspecified: Secondary | ICD-10-CM | POA: Diagnosis not present

## 2015-12-07 DIAGNOSIS — E213 Hyperparathyroidism, unspecified: Secondary | ICD-10-CM | POA: Diagnosis not present

## 2015-12-07 DIAGNOSIS — E119 Type 2 diabetes mellitus without complications: Secondary | ICD-10-CM | POA: Diagnosis not present

## 2015-12-07 DIAGNOSIS — I10 Essential (primary) hypertension: Secondary | ICD-10-CM | POA: Diagnosis not present

## 2015-12-07 DIAGNOSIS — N184 Chronic kidney disease, stage 4 (severe): Secondary | ICD-10-CM | POA: Diagnosis not present

## 2015-12-08 ENCOUNTER — Other Ambulatory Visit: Payer: Self-pay | Admitting: *Deleted

## 2015-12-08 DIAGNOSIS — D352 Benign neoplasm of pituitary gland: Secondary | ICD-10-CM

## 2015-12-09 MED ORDER — CINACALCET HCL 30 MG PO TABS: 90.0000 mg | ORAL_TABLET | Freq: Every day | ORAL | Status: DC

## 2015-12-14 ENCOUNTER — Ambulatory Visit (INDEPENDENT_AMBULATORY_CARE_PROVIDER_SITE_OTHER): Payer: Medicare Other | Admitting: Family Medicine

## 2015-12-14 ENCOUNTER — Encounter: Payer: Self-pay | Admitting: Family Medicine

## 2015-12-14 VITALS — BP 108/64 | HR 62 | Temp 97.0°F | Ht 62.0 in | Wt 153.8 lb

## 2015-12-14 DIAGNOSIS — E119 Type 2 diabetes mellitus without complications: Secondary | ICD-10-CM

## 2015-12-14 DIAGNOSIS — M1A9XX0 Chronic gout, unspecified, without tophus (tophi): Secondary | ICD-10-CM | POA: Diagnosis not present

## 2015-12-14 DIAGNOSIS — N184 Chronic kidney disease, stage 4 (severe): Secondary | ICD-10-CM | POA: Diagnosis not present

## 2015-12-14 LAB — POCT GLYCOSYLATED HEMOGLOBIN (HGB A1C): HEMOGLOBIN A1C: 6.7

## 2015-12-14 NOTE — Patient Instructions (Signed)
Great to see you guys!  We will call with lab results within 1 week  Please call us if you need [place] sooner than 3 months, otherwise we will see you then.

## 2015-12-14 NOTE — Progress Notes (Signed)
   HPI  Patient presents today for routine follow-up.  Diabetes No medications Not checking blood sugar Not really watching her diet. No foot numbness.  Chronic kidney disease She's preparing to get an AV fistula placed, she's seeing  Dr. Florene Glen at [facility]. She has no itching, her diet is good, her appetite is good. She does complain of some recent intolerance of meats, however she can tolerate other fatty foods and has normal by mouth intake.  Gout No complaints Good medication compliance   PMH: Smoking status noted ROS: Per HPI  Objective: BP 108/64 mmHg  Pulse 62  Temp(Src) 97 F (36.1 C) (Oral)  Ht '5\' 2"'$  (1.575 m)  Wt 153 lb 12.8 oz (69.763 kg)  BMI 28.12 kg/m2 Gen: NAD, alert, cooperative with exam HEENT: NCAT CV: RRR, good S1/S2, no murmur Resp: CTABL, no wheezes, non-labored Ext: No edema, warm Neuro: Alert and oriented, No gross deficits  Diabetic foot exam Right foot with a long thick toenail on the third toe 2 plus dorsalis pedis pulses bilaterally, no lesions, sensation intact to monofilament throughout  Assessment and plan:  # type 2 diabetes Well-controlled with only diet Repeat A1c at least every 6 months Follow-up in 4 months  # chronic kidney disease Managed actively by Dr. Florene Glen at [facility] Checking CMP today   # gout Previous uric acid at goal Plan to check once annually Continue uloric   Orders Placed This Encounter  Procedures  . POCT glycosylated hemoglobin (Hb A1C)    Meds ordered this encounter  Medications  . calcium carbonate (OSCAL) 1500 (600 Ca) MG TABS tablet    Sig: Take by mouth daily with breakfast.    Laroy Apple, MD [facility] 12/14/2015, 2:09 PM

## 2015-12-15 LAB — CMP14+EGFR
A/G RATIO: 1.4 (ref 1.1–2.5)
ALT: 9 IU/L (ref 0–32)
AST: 13 IU/L (ref 0–40)
Albumin: 3.6 g/dL (ref 3.5–4.8)
Alkaline Phosphatase: 85 IU/L (ref 39–117)
BILIRUBIN TOTAL: 0.3 mg/dL (ref 0.0–1.2)
BUN/Creatinine Ratio: 13 (ref 11–26)
BUN: 37 mg/dL — ABNORMAL HIGH (ref 8–27)
CALCIUM: 9.4 mg/dL (ref 8.7–10.3)
CHLORIDE: 106 mmol/L (ref 96–106)
CO2: 23 mmol/L (ref 18–29)
Creatinine, Ser: 2.78 mg/dL — ABNORMAL HIGH (ref 0.57–1.00)
GFR calc Af Amer: 18 mL/min/{1.73_m2} — ABNORMAL LOW (ref >59)
GFR calc non Af Amer: 16 mL/min/{1.73_m2} — ABNORMAL LOW (ref >59)
GLOBULIN, TOTAL: 2.5 g/dL (ref 1.5–4.5)
Glucose: 144 mg/dL — ABNORMAL HIGH (ref 65–99)
POTASSIUM: 4.6 mmol/L (ref 3.5–5.2)
SODIUM: 144 mmol/L (ref 134–144)
Total Protein: 6.1 g/dL (ref 6.0–8.5)

## 2015-12-29 DIAGNOSIS — J449 Chronic obstructive pulmonary disease, unspecified: Secondary | ICD-10-CM | POA: Diagnosis not present

## 2015-12-30 ENCOUNTER — Other Ambulatory Visit: Payer: Self-pay

## 2015-12-30 DIAGNOSIS — Z1231 Encounter for screening mammogram for malignant neoplasm of breast: Secondary | ICD-10-CM

## 2016-01-11 ENCOUNTER — Other Ambulatory Visit: Payer: Self-pay | Admitting: *Deleted

## 2016-01-13 ENCOUNTER — Ambulatory Visit: Payer: Medicare Other

## 2016-01-13 MED ORDER — CINACALCET HCL 30 MG PO TABS: 90.0000 mg | ORAL_TABLET | Freq: Every day | ORAL | Status: DC

## 2016-01-20 ENCOUNTER — Other Ambulatory Visit: Payer: Self-pay | Admitting: *Deleted

## 2016-01-26 DIAGNOSIS — J449 Chronic obstructive pulmonary disease, unspecified: Secondary | ICD-10-CM | POA: Diagnosis not present

## 2016-02-21 ENCOUNTER — Other Ambulatory Visit: Payer: Self-pay | Admitting: *Deleted

## 2016-02-21 DIAGNOSIS — M810 Age-related osteoporosis without current pathological fracture: Secondary | ICD-10-CM

## 2016-02-21 MED ORDER — ALENDRONATE SODIUM 70 MG PO TABS: 70.0000 mg | ORAL_TABLET | ORAL | Status: DC

## 2016-02-22 ENCOUNTER — Other Ambulatory Visit: Payer: Self-pay

## 2016-02-22 DIAGNOSIS — M810 Age-related osteoporosis without current pathological fracture: Secondary | ICD-10-CM

## 2016-02-22 MED ORDER — ALENDRONATE SODIUM 70 MG PO TABS: 70.0000 mg | ORAL_TABLET | ORAL | Status: DC

## 2016-02-25 DIAGNOSIS — I10 Essential (primary) hypertension: Secondary | ICD-10-CM | POA: Diagnosis not present

## 2016-02-25 DIAGNOSIS — N184 Chronic kidney disease, stage 4 (severe): Secondary | ICD-10-CM | POA: Diagnosis not present

## 2016-02-25 DIAGNOSIS — E213 Hyperparathyroidism, unspecified: Secondary | ICD-10-CM | POA: Diagnosis not present

## 2016-02-25 DIAGNOSIS — E119 Type 2 diabetes mellitus without complications: Secondary | ICD-10-CM | POA: Diagnosis not present

## 2016-02-26 DIAGNOSIS — J449 Chronic obstructive pulmonary disease, unspecified: Secondary | ICD-10-CM | POA: Diagnosis not present

## 2016-03-06 ENCOUNTER — Ambulatory Visit: Payer: Medicare Other

## 2016-03-13 ENCOUNTER — Other Ambulatory Visit: Payer: Self-pay | Admitting: Nephrology

## 2016-03-13 ENCOUNTER — Other Ambulatory Visit: Payer: Self-pay | Admitting: Vascular Surgery

## 2016-03-13 DIAGNOSIS — Z01810 Encounter for preprocedural cardiovascular examination: Secondary | ICD-10-CM

## 2016-03-13 DIAGNOSIS — N185 Chronic kidney disease, stage 5: Secondary | ICD-10-CM

## 2016-03-13 DIAGNOSIS — N184 Chronic kidney disease, stage 4 (severe): Secondary | ICD-10-CM

## 2016-03-14 ENCOUNTER — Ambulatory Visit: Payer: Medicare Other | Admitting: Family Medicine

## 2016-03-27 DIAGNOSIS — J449 Chronic obstructive pulmonary disease, unspecified: Secondary | ICD-10-CM | POA: Diagnosis not present

## 2016-03-29 ENCOUNTER — Encounter: Payer: Self-pay | Admitting: Vascular Surgery

## 2016-04-05 ENCOUNTER — Encounter: Payer: Self-pay | Admitting: Vascular Surgery

## 2016-04-06 ENCOUNTER — Inpatient Hospital Stay (HOSPITAL_COMMUNITY): Admission: RE | Admit: 2016-04-06 | Payer: Medicare Other | Source: Ambulatory Visit

## 2016-04-06 ENCOUNTER — Ambulatory Visit: Payer: Medicare Other | Admitting: Vascular Surgery

## 2016-04-13 ENCOUNTER — Ambulatory Visit (INDEPENDENT_AMBULATORY_CARE_PROVIDER_SITE_OTHER): Payer: Medicare Other | Admitting: Family Medicine

## 2016-04-13 ENCOUNTER — Encounter: Payer: Self-pay | Admitting: Family Medicine

## 2016-04-13 VITALS — BP 131/69 | HR 55 | Temp 96.9°F | Ht 62.0 in | Wt 149.4 lb

## 2016-04-13 DIAGNOSIS — I10 Essential (primary) hypertension: Secondary | ICD-10-CM | POA: Diagnosis not present

## 2016-04-13 DIAGNOSIS — E119 Type 2 diabetes mellitus without complications: Secondary | ICD-10-CM

## 2016-04-13 DIAGNOSIS — E785 Hyperlipidemia, unspecified: Secondary | ICD-10-CM

## 2016-04-13 DIAGNOSIS — M1A9XX0 Chronic gout, unspecified, without tophus (tophi): Secondary | ICD-10-CM

## 2016-04-13 LAB — BAYER DCA HB A1C WAIVED: HB A1C (BAYER DCA - WAIVED): 6.6 % (ref <7.0)

## 2016-04-13 NOTE — Patient Instructions (Signed)
Great to see you!  Lets follow up in 4 months  Her A1C, the measure of average blood glucose for 3 months, is very good. We will keep checking this about every 4 months or so.   Please feel free to come back sooner if she needs anything.

## 2016-04-13 NOTE — Progress Notes (Signed)
   HPI  Patient presents today here for follow-up hypertension and diabetes.  Hypertension No chest pain, dyspnea, palpitations No medications, lifestyle controlled.  Type 2 diabetes Mild, A1c has been controlled without medication so far Not checking blood sugars No neuropathy.  Hyperlipidemia Taking Lipitor daily No complaints of myalgias Not watching diet.  She has CKD stage 4 and is scheduled for vein mapping in July.   PMH: Smoking status noted ROS: Per HPI  Objective: BP 131/69 mmHg  Pulse 55  Temp(Src) 96.9 F (36.1 C) (Oral)  Ht '5\' 2"'$  (1.575 m)  Wt 149 lb 6.4 oz (67.767 kg)  BMI 27.32 kg/m2 Gen: NAD, alert, cooperative with exam HEENT: NCAT CV: RRR, good S1/S2, no murmur Resp: CTABL, no wheezes, non-labored Ext: No edema, warm Neuro: Alert and conversational, appears to be hard of hearing  Diabetic Foot Exam - Simple   Simple Foot Form  Visual Inspection  No deformities, no ulcerations, no other skin breakdown bilaterally:  Yes  Sensation Testing  Intact to touch and monofilament testing bilaterally:  Yes  Pulse Check  Posterior Tibialis and Dorsalis pulse intact bilaterally:  Yes  Comments       Assessment and plan:  # Type 2 diabetes A1c well controlled at 6.6 with only diet control Continue avoiding medication for now Follow-up 4 months Diabetic foot exam good today  # Hypertension Has diagnosis, however blood pressure is well controlled without medications Continue to monitor  # Hyperlipidemia No complaints of myalgias, plan to recheck lipids next visit  # Gout No complaints of gout, has occasional right wrist pain which goes away easily with Tylenol Continue [place], MD [facility] 04/13/2016, 2:09 PM

## 2016-04-27 DIAGNOSIS — J449 Chronic obstructive pulmonary disease, unspecified: Secondary | ICD-10-CM | POA: Diagnosis not present

## 2016-05-01 ENCOUNTER — Encounter: Payer: Self-pay | Admitting: Vascular Surgery

## 2016-05-10 ENCOUNTER — Ambulatory Visit (HOSPITAL_COMMUNITY)
Admission: RE | Admit: 2016-05-10 | Payer: Medicare Other | Source: Ambulatory Visit | Attending: Vascular Surgery | Admitting: Vascular Surgery

## 2016-05-10 ENCOUNTER — Ambulatory Visit (HOSPITAL_COMMUNITY): Payer: Medicare Other | Attending: Vascular Surgery

## 2016-05-10 ENCOUNTER — Ambulatory Visit: Payer: Medicare Other | Admitting: Vascular Surgery

## 2016-05-11 ENCOUNTER — Other Ambulatory Visit: Payer: Self-pay | Admitting: Family Medicine

## 2016-05-11 NOTE — Telephone Encounter (Signed)
Visit with April [name] 04/13/16.

## 2016-05-27 DIAGNOSIS — J449 Chronic obstructive pulmonary disease, unspecified: Secondary | ICD-10-CM | POA: Diagnosis not present

## 2016-06-26 ENCOUNTER — Other Ambulatory Visit: Payer: Self-pay | Admitting: Family Medicine

## 2016-06-27 DIAGNOSIS — J449 Chronic obstructive pulmonary disease, unspecified: Secondary | ICD-10-CM | POA: Diagnosis not present

## 2016-07-13 ENCOUNTER — Encounter: Payer: Self-pay | Admitting: Vascular Surgery

## 2016-07-17 ENCOUNTER — Other Ambulatory Visit: Payer: Self-pay | Admitting: Family Medicine

## 2016-07-19 ENCOUNTER — Ambulatory Visit: Payer: Medicare Other | Admitting: Vascular Surgery

## 2016-07-19 ENCOUNTER — Other Ambulatory Visit (HOSPITAL_COMMUNITY): Payer: Medicare Other

## 2016-07-19 ENCOUNTER — Encounter (HOSPITAL_COMMUNITY): Payer: Medicare Other

## 2016-07-20 ENCOUNTER — Encounter: Payer: Self-pay | Admitting: Vascular Surgery

## 2016-07-21 ENCOUNTER — Ambulatory Visit (INDEPENDENT_AMBULATORY_CARE_PROVIDER_SITE_OTHER)
Admission: RE | Admit: 2016-07-21 | Discharge: 2016-07-21 | Disposition: A | Discharge status: Home / Self Care | Payer: Medicare Other | Source: Ambulatory Visit | Attending: Vascular Surgery | Admitting: Vascular Surgery

## 2016-07-21 ENCOUNTER — Other Ambulatory Visit: Payer: Self-pay | Admitting: *Deleted

## 2016-07-21 ENCOUNTER — Ambulatory Visit (HOSPITAL_COMMUNITY)
Admission: RE | Admit: 2016-07-21 | Discharge: 2016-07-21 | Disposition: A | Discharge status: Home / Self Care | Payer: Medicare Other | Source: Ambulatory Visit | Attending: Vascular Surgery | Admitting: Vascular Surgery

## 2016-07-21 ENCOUNTER — Ambulatory Visit (INDEPENDENT_AMBULATORY_CARE_PROVIDER_SITE_OTHER): Payer: Medicare Other | Admitting: Vascular Surgery

## 2016-07-21 ENCOUNTER — Encounter: Payer: Self-pay | Admitting: *Deleted

## 2016-07-21 ENCOUNTER — Encounter: Payer: Self-pay | Admitting: Vascular Surgery

## 2016-07-21 VITALS — BP 166/65 | HR 68 | Temp 97.5°F | Resp 18 | Ht 62.0 in | Wt 149.0 lb

## 2016-07-21 DIAGNOSIS — N184 Chronic kidney disease, stage 4 (severe): Secondary | ICD-10-CM

## 2016-07-21 DIAGNOSIS — Z01810 Encounter for preprocedural cardiovascular examination: Secondary | ICD-10-CM

## 2016-07-21 NOTE — Progress Notes (Signed)
Patient ID: April [name], female   DOB: 11/09/1934, 81 y.o.   MRN: [id number]  Reason for Consult: New Evaluation (referral from Dr. Florene Glen, evaluate for access with UE vein mapping )   Referred by Timmothy Euler, MD  Subjective:     HPI:  April [name] is a 81 y.o. female CTD from diabetes hypertension. She now presents for consideration of dialysis access. She does not complain of pain in either upper or lower extremity. She is hard of hearing most exam performed through her son who is a very good historian. She is currently not having any issues. She does walk minimally uses wheelchair to get around mostly. She takes statin but no blood thinners. She is right-hand dominant.  Past Medical History:  Diagnosis Date  . Chronic kidney disease   . Diabetes mellitus   . Hypertension   . Hyperthyroidism   . Perforated ulcer ([place])    History reviewed. No pertinent family history. Past Surgical History:  Procedure Laterality Date  . LAPAROTOMY  06/10/2012   Procedure: Omental patch of perforated ulcer, EXPLORATORY LAPAROTOMY;  Surgeon: Odis Hollingshead, MD;  Location: [facility];  Service: General;  Laterality: N/A;  closure of perforated gastric ulcer  . TUMOR REMOVAL     of her sinus cavity    Short Social History:  Social History  Substance Use Topics  . Smoking status: Former Smoker    Packs/day: 1.00    Years: 52.00    Types: Cigarettes    Quit date: 06/14/2012  . Smokeless tobacco: Never Used  . Alcohol use No    Allergies  Allergen Reactions  . Nsaids     Pt had perforated ulcer - patched [id number]    Current Outpatient Prescriptions  Medication Sig Dispense Refill  . alendronate (FOSAMAX) 70 MG tablet Take 1 tablet (70 mg total) by mouth every 7 (seven) days. Take with a full glass of water on an empty stomach. 12 tablet 1  . atorvastatin (LIPITOR) 20 MG tablet Take 1 tablet (20 mg total) by mouth daily. For hyperlipidemia 90 tablet 3  . calcium carbonate (OSCAL)  1500 (600 Ca) MG TABS tablet Take by mouth daily with breakfast.    . cholecalciferol (VITAMIN D) 1000 UNITS tablet Take 2 tablets (2,000 Units total) by mouth daily. For Vit D deficiency 180 tablet 3  . pantoprazole (PROTONIX) 40 MG tablet TAKE 1 TABLET (40 MG TOTAL) BY MOUTH DAILY. 90 tablet 1  . SENSIPAR 30 MG tablet TAKE 3 TABLETS (90 MG TOTAL) BY MOUTH DAILY. FOR HYPERPARATHYROIDISM 90 tablet 3  . ULORIC 40 MG tablet TAKE 1 TABLET (40 MG TOTAL) BY MOUTH DAILY. FOR GOUT 90 tablet 0   No current facility-administered medications for this visit.     Review of Systems  Constitutional:  Constitutional negative. Eyes: Eyes negative.  Respiratory: Respiratory negative.  Cardiovascular: Cardiovascular negative.  GI: Gastrointestinal negative.  Musculoskeletal: Musculoskeletal negative.  Skin: Skin negative.  Neurological: Neurological negative. Hematologic: Hematologic/lymphatic negative.  Psychiatric: Psychiatric negative.        Objective:  Objective   Vitals:   07/21/16 1219  BP: (!) 166/65  Pulse: 68  Resp: 18  Temp: 97.5 F (36.4 C)  TempSrc: Oral  SpO2: 95%  Weight: 149 lb (67.6 kg)  Height: '5\' 2"'$  (1.575 m)   Body mass index is 27.25 kg/m.  Physical Exam  Constitutional: She is oriented to person, place, and time. She appears well-developed and well-nourished.  HENT:  Head:  Normocephalic.  Eyes: EOM are normal.  Neck: Normal range of motion.  Cardiovascular: Normal rate and regular rhythm.   Pulses:      Radial pulses are 2+ on the right side, and 0 on the left side.  Pulmonary/Chest: Effort normal.  Abdominal: Soft. She exhibits no mass.  Musculoskeletal: Normal range of motion.  Neurological: She is alert and oriented to person, place, and time.  Skin: Skin is warm and dry.    Data: Cephalically and vein on the left at the antecubital fossa 0.38 cm up to 0.55 cm at the shoulder.  Brachial artery on the left intraluminal diameters 0.37 cm with PSV 1:15  and triphasic.     Assessment/Plan:  81 year old white female with C Katie followed by Dr. Wyn[facility] sent for evaluation of dialysis access. She has adequate vein and artery at the antecubital fossa on the left her nondominant arm and we will pursue left upper extremity AV fistula. I discussed the risks including risk of nonfunctioning risk of failure risk of requiring multiple procedures risk of ischemia risk of nerve damage risk of injury to the artery. Son demonstrates good understanding and they are willing to proceed in the coming weeks.     April Sandy MD [facility]

## 2016-07-27 DIAGNOSIS — I10 Essential (primary) hypertension: Secondary | ICD-10-CM | POA: Diagnosis not present

## 2016-07-27 DIAGNOSIS — N184 Chronic kidney disease, stage 4 (severe): Secondary | ICD-10-CM | POA: Diagnosis not present

## 2016-07-27 DIAGNOSIS — E213 Hyperparathyroidism, unspecified: Secondary | ICD-10-CM | POA: Diagnosis not present

## 2016-07-27 DIAGNOSIS — E119 Type 2 diabetes mellitus without complications: Secondary | ICD-10-CM | POA: Diagnosis not present

## 2016-07-27 NOTE — Progress Notes (Signed)
Called patient to give instructions for surgery, no answer, and no way to leave a message, will try again

## 2016-07-28 DIAGNOSIS — J449 Chronic obstructive pulmonary disease, unspecified: Secondary | ICD-10-CM | POA: Diagnosis not present

## 2016-07-28 NOTE — Progress Notes (Signed)
Called patient to give preop instructions, no answer and not able to leave a message.

## 2016-07-31 ENCOUNTER — Ambulatory Visit (HOSPITAL_COMMUNITY): Admission: RE | Admit: 2016-07-31 | Payer: Medicare Other | Source: Ambulatory Visit | Admitting: Vascular Surgery

## 2016-07-31 ENCOUNTER — Other Ambulatory Visit: Payer: Self-pay | Admitting: Family Medicine

## 2016-07-31 ENCOUNTER — Encounter (HOSPITAL_COMMUNITY): Payer: Self-pay | Admitting: Certified Registered Nurse Anesthetist

## 2016-07-31 DIAGNOSIS — M810 Age-related osteoporosis without current pathological fracture: Secondary | ICD-10-CM

## 2016-07-31 SURGERY — ARTERIOVENOUS (AV) FISTULA CREATION
Anesthesia: Monitor Anesthesia Care | Laterality: Left

## 2016-08-01 ENCOUNTER — Telehealth: Payer: Self-pay

## 2016-08-01 NOTE — Telephone Encounter (Signed)
Called patient to reschedule Left arm AVF with Dr. Donzetta Matters. Patient's son, Jacquelinne Speak, answered and stated, "the kidney doctor told us it isn't a rush, so we're going to put it off another month or two." Instructed Mr. Fitch to contact out office when patient decided to proceed with surgery and/or if she has further questions/concerns. Mr. Frumkin verbalized understanding.

## 2016-08-16 ENCOUNTER — Encounter: Payer: Self-pay | Admitting: Family Medicine

## 2016-08-16 ENCOUNTER — Ambulatory Visit (INDEPENDENT_AMBULATORY_CARE_PROVIDER_SITE_OTHER): Payer: Medicare Other | Admitting: Family Medicine

## 2016-08-16 VITALS — BP 125/69 | HR 65 | Temp 97.0°F | Ht 62.0 in | Wt 145.4 lb

## 2016-08-16 DIAGNOSIS — E785 Hyperlipidemia, unspecified: Secondary | ICD-10-CM | POA: Diagnosis not present

## 2016-08-16 DIAGNOSIS — Z23 Encounter for immunization: Secondary | ICD-10-CM | POA: Diagnosis not present

## 2016-08-16 DIAGNOSIS — N184 Chronic kidney disease, stage 4 (severe): Secondary | ICD-10-CM

## 2016-08-16 DIAGNOSIS — E119 Type 2 diabetes mellitus without complications: Secondary | ICD-10-CM | POA: Diagnosis not present

## 2016-08-16 DIAGNOSIS — E441 Mild protein-calorie malnutrition: Secondary | ICD-10-CM

## 2016-08-16 DIAGNOSIS — Z0000 Encounter for general adult medical examination without abnormal findings: Secondary | ICD-10-CM

## 2016-08-16 DIAGNOSIS — M1A9XX0 Chronic gout, unspecified, without tophus (tophi): Secondary | ICD-10-CM | POA: Diagnosis not present

## 2016-08-16 DIAGNOSIS — I10 Essential (primary) hypertension: Secondary | ICD-10-CM | POA: Diagnosis not present

## 2016-08-16 LAB — BAYER DCA HB A1C WAIVED: HB A1C: 6.4 % (ref <7.0)

## 2016-08-16 MED ORDER — GLUCERNA SHAKE PO LIQD: 237.0000 mL | Freq: Two times a day (BID) | ORAL | 5 refills | Status: AC

## 2016-08-16 NOTE — Patient Instructions (Signed)
Great to see you!  Her diabetic control looks good, keep doing what you're doing.   Lets follow up in 4 months

## 2016-08-16 NOTE — Progress Notes (Signed)
   HPI  Patient presents today for follow-up of chronic medical conditions including diabetes.  Diabetes Has been diet controlled for the last several checks, she is not watching her diet, however her son complains of decreased appetite. He would like some supplements if possible. Does not check blood sugar No medications.  Hypertension No blood pressure checks at home, no medications either.  Stage IV kidney disease, regular follow-up with urology, they recommended AV fistula which they are preparing to place.    PMH: Smoking status noted ROS: Per HPI  Objective: BP 125/69   Pulse 65   Temp 97 F (36.1 C) (Oral)   Ht '5\' 2"'$  (1.575 m)   Wt 145 lb 6.4 oz (66 kg)   BMI 26.59 kg/m  Gen: NAD, alert, cooperative with exam HEENT: NCAT, hearing aids in place, hard of hearing CV: RRR, good S1/S2, no murmur Resp: CTABL, no wheezes, non-labored Ext: No edema, warm Neuro: Alert and interactive, no gross deficits- in wheelchair  Diabetic Foot Exam - Simple   Simple Foot Form Visual Inspection No deformities, no ulcerations, no other skin breakdown bilaterally:  Yes Sensation Testing Intact to touch and monofilament testing bilaterally:  Yes Pulse Check Posterior Tibialis and Dorsalis pulse intact bilaterally:  Yes Comments      Assessment and plan:  # Type 2 diabetes Diet controlled, stable Continue current treatment, watchful waiting and A1c every 4 months. Foot exam is normal  # Hypertension Also diet controlled, continue to monitor  # Chronic kidney disease stage IV Repeat labs, agreed with AV fistula- patient's family to pursue  # mild protein-calorie malnutrition Poor diet or appetite glucerna BID BM  Flu shot given today- counseling provided   Orders Placed This Encounter  Procedures  . Flu vaccine HIGH DOSE PF  . Bayer DCA Hb A1c Waived    Meds ordered this encounter  Medications  . feeding supplement, GLUCERNA SHAKE, (GLUCERNA SHAKE) LIQD   Sig: Take 237 mLs by mouth 2 (two) times daily between meals.    Dispense:  60 Can    Refill:  [place], MD [place] 08/16/2016, 2:17 PM

## 2016-08-17 LAB — CBC WITH DIFFERENTIAL/PLATELET
BASOS: 0 %
Basophils Absolute: 0 10*3/uL (ref 0.0–0.2)
EOS (ABSOLUTE): 0.1 10*3/uL (ref 0.0–0.4)
Eos: 1 %
HEMOGLOBIN: 11 g/dL — AB (ref 11.1–15.9)
Hematocrit: 34.3 % (ref 34.0–46.6)
IMMATURE GRANS (ABS): 0.1 10*3/uL (ref 0.0–0.1)
Immature Granulocytes: 1 %
LYMPHS: 30 %
Lymphocytes Absolute: 2.9 10*3/uL (ref 0.7–3.1)
MCH: 27.2 pg (ref 26.6–33.0)
MCHC: 32.1 g/dL (ref 31.5–35.7)
MCV: 85 fL (ref 79–97)
MONOCYTES: 8 %
Monocytes Absolute: 0.7 10*3/uL (ref 0.1–0.9)
NEUTROS ABS: 5.6 10*3/uL (ref 1.4–7.0)
Neutrophils: 60 %
Platelets: 219 10*3/uL (ref 150–379)
RBC: 4.04 x10E6/uL (ref 3.77–5.28)
RDW: 14.1 % (ref 12.3–15.4)
WBC: 9.4 10*3/uL (ref 3.4–10.8)

## 2016-08-17 LAB — CMP14+EGFR
A/G RATIO: 1.4 (ref 1.2–2.2)
ALT: 11 IU/L (ref 0–32)
AST: 11 IU/L (ref 0–40)
Albumin: 3.7 g/dL (ref 3.5–4.8)
Alkaline Phosphatase: 90 IU/L (ref 39–117)
BUN/Creatinine Ratio: 11 — ABNORMAL LOW (ref 12–28)
BUN: 36 mg/dL — AB (ref 8–27)
Bilirubin Total: <0.2 mg/dL (ref 0.0–1.2)
CALCIUM: 9.7 mg/dL (ref 8.7–10.3)
CO2: 23 mmol/L (ref 18–29)
Chloride: 107 mmol/L — ABNORMAL HIGH (ref 96–106)
Creatinine, Ser: 3.31 mg/dL (ref 0.57–1.00)
GFR, EST AFRICAN AMERICAN: 15 mL/min/{1.73_m2} — AB (ref >59)
GFR, EST NON AFRICAN AMERICAN: 13 mL/min/{1.73_m2} — AB (ref >59)
GLUCOSE: 174 mg/dL — AB (ref 65–99)
Globulin, Total: 2.6 g/dL (ref 1.5–4.5)
Potassium: 4.7 mmol/L (ref 3.5–5.2)
Sodium: 145 mmol/L — ABNORMAL HIGH (ref 134–144)
TOTAL PROTEIN: 6.3 g/dL (ref 6.0–8.5)

## 2016-08-17 LAB — PREALBUMIN: PREALBUMIN: 26 mg/dL (ref 9–32)

## 2016-08-17 LAB — LDL CHOLESTEROL, DIRECT: LDL Direct: 59 mg/dL (ref 0–99)

## 2016-08-27 DIAGNOSIS — J449 Chronic obstructive pulmonary disease, unspecified: Secondary | ICD-10-CM | POA: Diagnosis not present

## 2016-09-12 ENCOUNTER — Other Ambulatory Visit: Payer: Self-pay | Admitting: Family Medicine

## 2016-09-27 DIAGNOSIS — J449 Chronic obstructive pulmonary disease, unspecified: Secondary | ICD-10-CM | POA: Diagnosis not present

## 2016-10-12 IMAGING — CT CT ABD-PELV W/O CM
2 of 4 series · 13 of 36 positions shown, 19 images · IV contrast (WATER)
Comparison: 06/10/2012

CLINICAL DATA: Benign tumor of pituitary gland and parathyroid
gland removed, concern for pancreatic tumor

EXAM:
CT ABDOMEN AND PELVIS WITHOUT CONTRAST
TECHNIQUE: Multidetector CT imaging of the abdomen and pelvis was performed
following the standard protocol without IV contrast.

[Series 3: abd/pelvis w/o · axial · non-contrast · 0.74mm/px · z∈[-373,+17]mm · 12 of 180 slices shown, 17 images]
[im 12/180  soft-tissue]
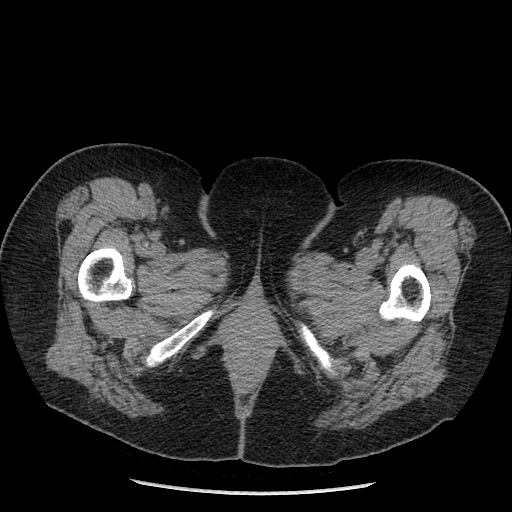
[im 12/180  bone]
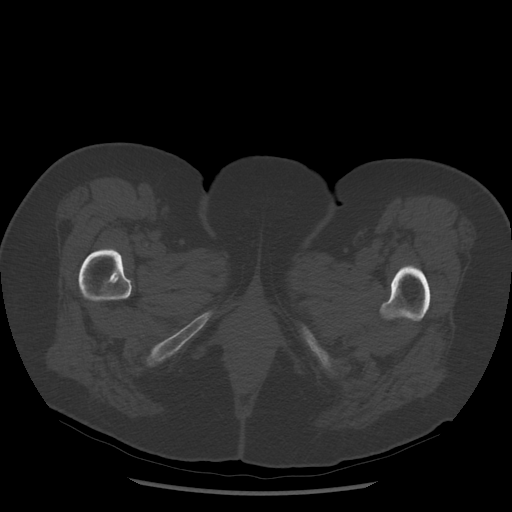
[im 24/180  soft-tissue]
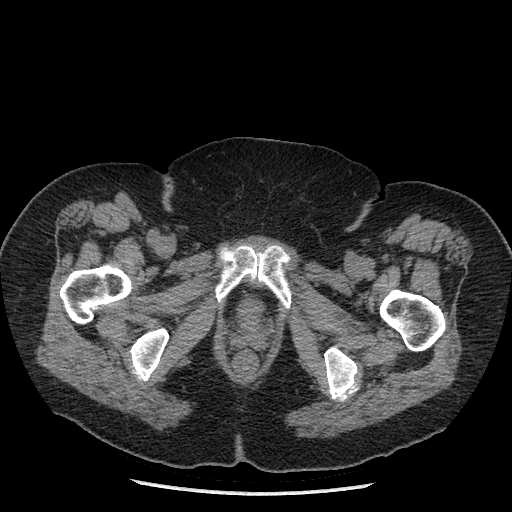
[im 48/180  soft-tissue]
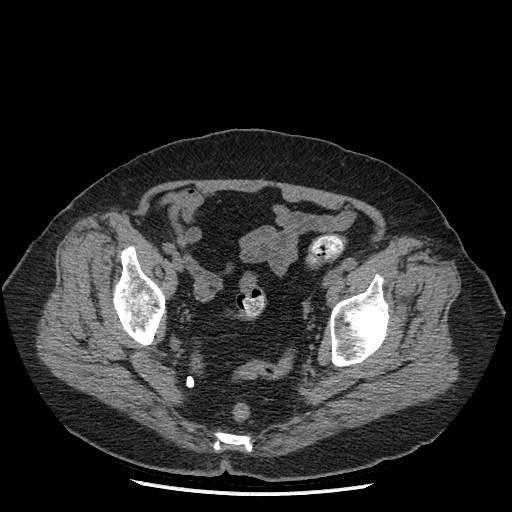
[im 60/180  soft-tissue]
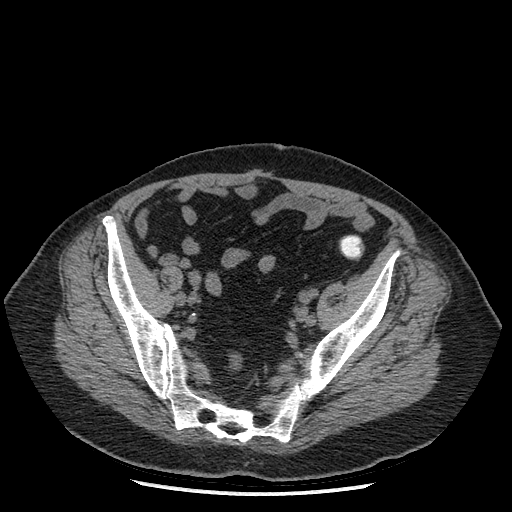
[im 72/180  soft-tissue]
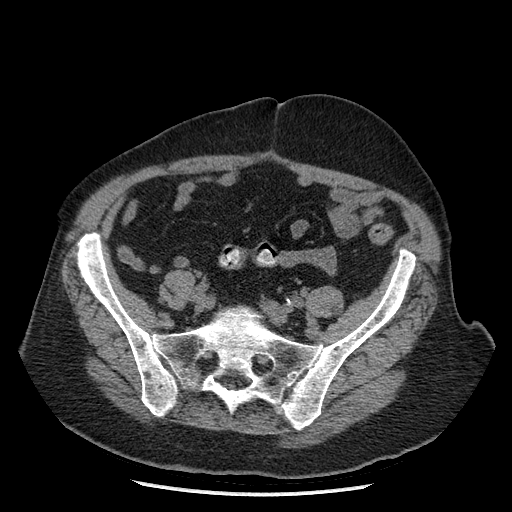
[im 96/180  soft-tissue]
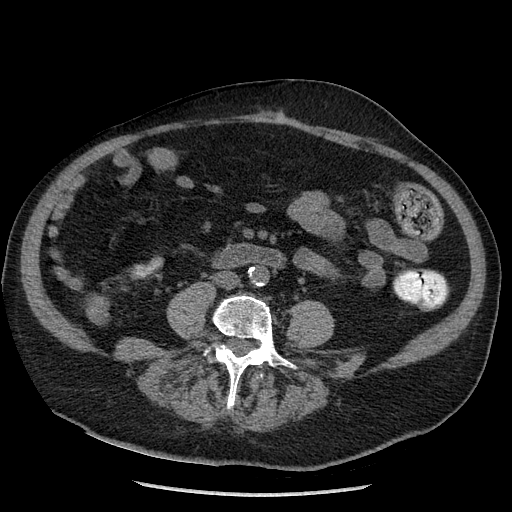
[im 108/180  soft-tissue]
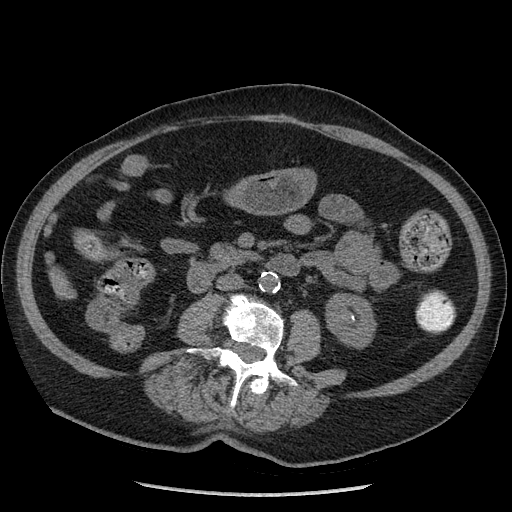
[im 120/180  soft-tissue]
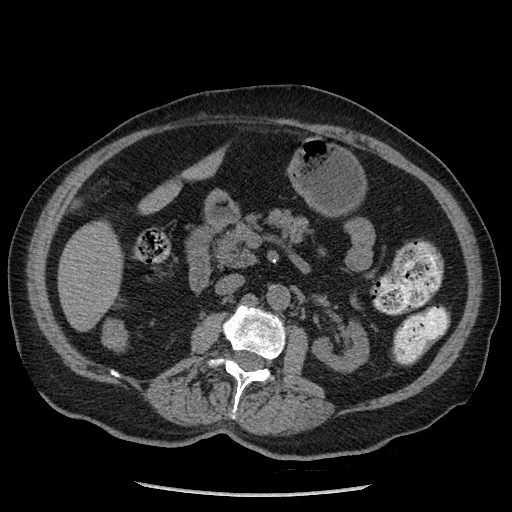
[im 132/180  soft-tissue]
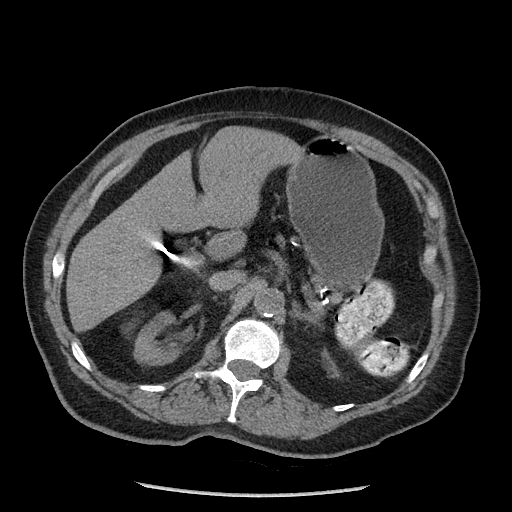
[im 132/180  lung]
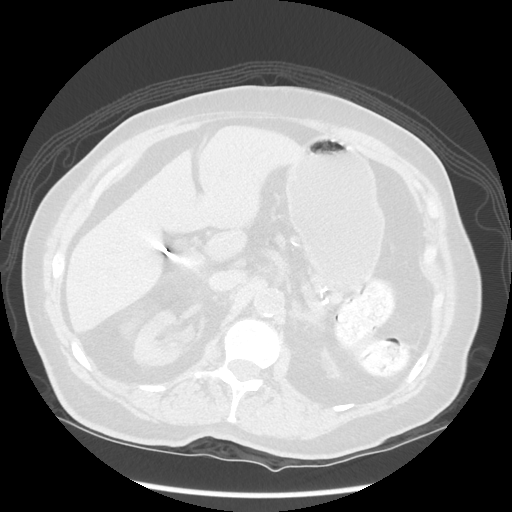
[im 132/180  bone]
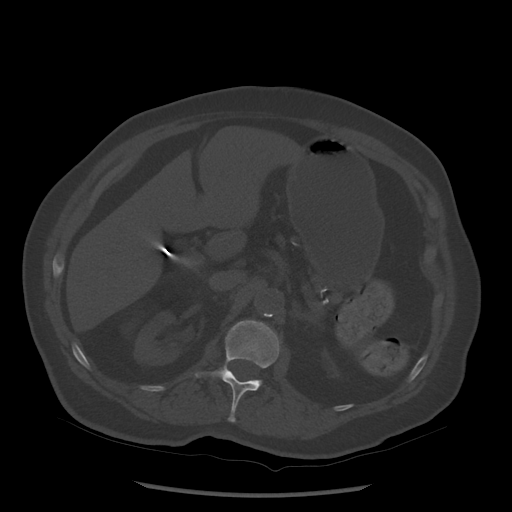
[im 144/180  lung]
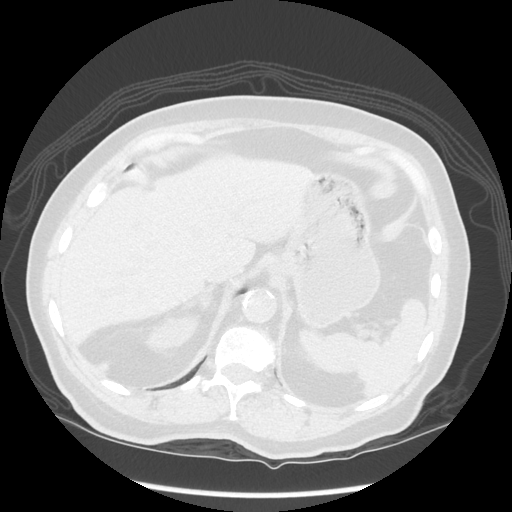
[im 156/180  soft-tissue]
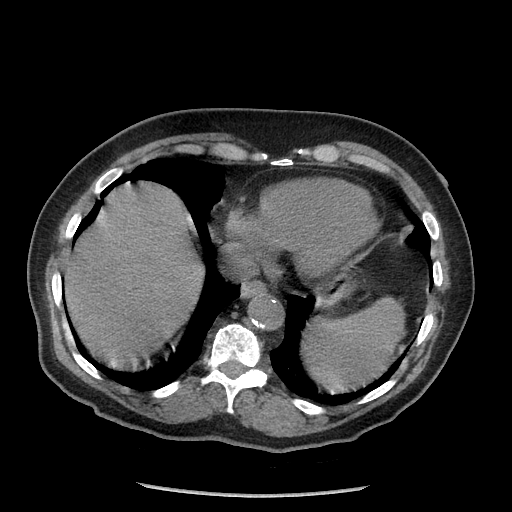
[im 156/180  lung]
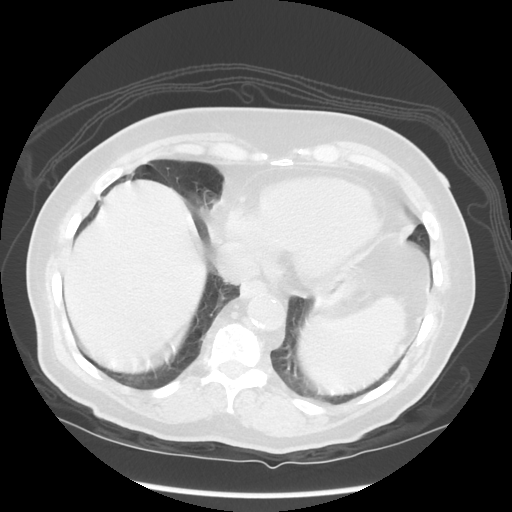
[im 168/180  soft-tissue]
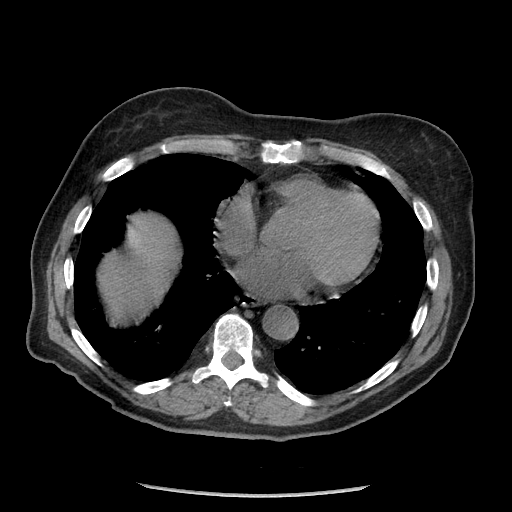
[im 168/180  lung]
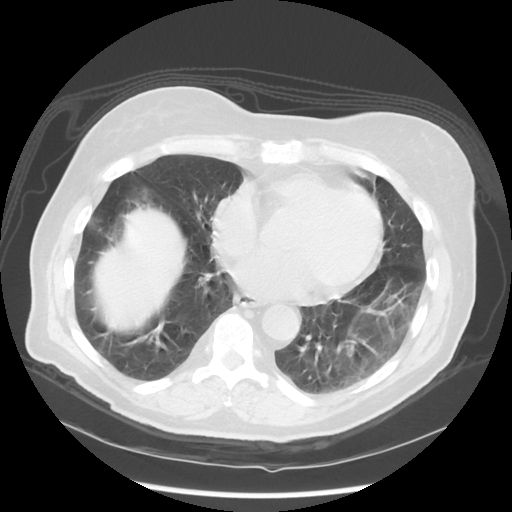

[Series 601: coronal body · coronal · 0.87mm/px · 1 of 123 slices shown, 2 images]
[im 41/123  soft-tissue]
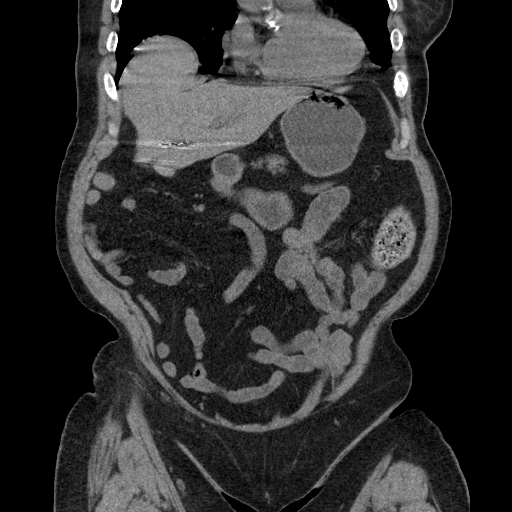
[im 41/123  bone]
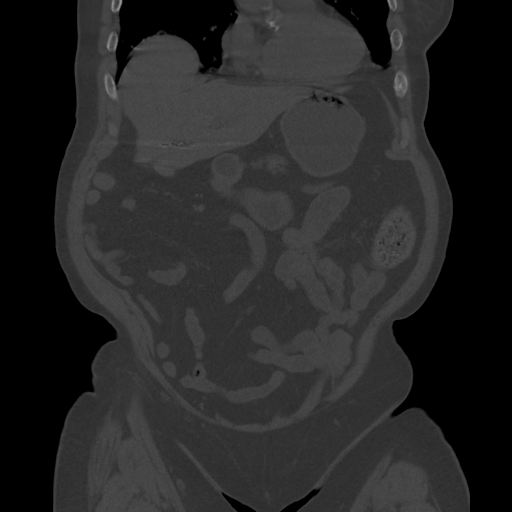

[13 of 36 positions shown; findings below may reference images not displayed]

FINDINGS: Lower chest:  No acute findings

Hepatobiliary: Status post cholecystectomy. Evaluation of cranial
portion of the liver limited by motion artifact.

Pancreas: Linear pancreatic tail calcifications similar to prior
study suggesting chronic pancreatitis. Cannot evaluate for
attenuation abnormalities without contrast. No evidence of
pancreatic contour disruption to suggest mass given limited
evaluation of pancreatic tail due to mild motion artifact through
this area.

Spleen: Normal

Adrenals/Urinary Tract: Adrenal glands are normal. There is
bilateral renal atrophy. No evidence of renal mass or cyst. No
hydronephrosis. Multiple left calculi in the lower pole the largest
measuring 8 mm. Bladder negative.

Stomach/Bowel: Stomach small bowel and large bowel are normal. There
is moderate fecal retention throughout the large bowel.

Vascular/Lymphatic: Moderate atherosclerotic calcification of the
aortoiliac vessels. No significant mesenteric or retroperitoneal
adenopathy.

Reproductive: Uterus appears to be absent. No pelvic masses
identified. 1 cm calcification left adnexa appears to involve left
ovary. This is stable. Small right adnexal calcifications stable as
well.

Other: No ascites

Musculoskeletal: No acute or focally suspicious abnormalities.
IMPRESSION: Study limited by lack of contrast and by motion artifact. Allowing
for this no evidence of pancreatic mass. There is pancreatic tail
calcifications similar to prior study suggesting previous chronic
pancreatitis.

Nonobstructing left renal calculi.

## 2016-10-13 ENCOUNTER — Other Ambulatory Visit: Payer: Self-pay | Admitting: Family Medicine

## 2016-10-23 ENCOUNTER — Other Ambulatory Visit: Payer: Self-pay | Admitting: Family Medicine

## 2016-10-23 DIAGNOSIS — M810 Age-related osteoporosis without current pathological fracture: Secondary | ICD-10-CM

## 2016-10-27 DIAGNOSIS — J449 Chronic obstructive pulmonary disease, unspecified: Secondary | ICD-10-CM | POA: Diagnosis not present

## 2016-11-03 ENCOUNTER — Ambulatory Visit (INDEPENDENT_AMBULATORY_CARE_PROVIDER_SITE_OTHER): Payer: Medicare Other | Admitting: Pediatrics

## 2016-11-03 ENCOUNTER — Ambulatory Visit (INDEPENDENT_AMBULATORY_CARE_PROVIDER_SITE_OTHER): Payer: Medicare Other

## 2016-11-03 ENCOUNTER — Encounter: Payer: Self-pay | Admitting: Pediatrics

## 2016-11-03 VITALS — BP 121/66 | HR 76 | Temp 97.9°F | Ht 62.0 in | Wt 151.0 lb

## 2016-11-03 DIAGNOSIS — R222 Localized swelling, mass and lump, trunk: Secondary | ICD-10-CM

## 2016-11-03 DIAGNOSIS — N63 Unspecified lump in unspecified breast: Secondary | ICD-10-CM

## 2016-11-03 DIAGNOSIS — Z20818 Contact with and (suspected) exposure to other bacterial communicable diseases: Secondary | ICD-10-CM | POA: Diagnosis not present

## 2016-11-03 DIAGNOSIS — Z1239 Encounter for other screening for malignant neoplasm of breast: Secondary | ICD-10-CM

## 2016-11-03 DIAGNOSIS — Z1231 Encounter for screening mammogram for malignant neoplasm of breast: Secondary | ICD-10-CM

## 2016-11-03 MED ORDER — AZITHROMYCIN 250 MG PO TABS: ORAL_TABLET | ORAL | 0 refills | Status: DC

## 2016-11-03 NOTE — Progress Notes (Signed)
  Subjective:   Patient ID: April [name], female    DOB: [birth year], 81 y.o.   MRN: [id number] CC: Breast Mass (Under left breast, Noticed 3 weeks ago, bacame harder )  HPI: April [name] is a 81 y.o. female presenting for Breast Mass (Under left breast, Noticed 3 weeks ago, bacame harder )  No tenderness to area Over lower L sided ant ribs Pt didn't notice Daughter noticed it when helping to bath her Not bothering pt  Has had two days of runny nose, congestion Exposed to grand child who family member says was reported to have been diagnosed with pertussis No fevers Other family members also with URi symptoms  Relevant past medical, surgical, family and social history reviewed. Allergies and medications reviewed and updated. History  Smoking Status  . Former Smoker  . Packs/day: 1.00  . Years: 52.00  . Types: Cigarettes  . Quit date: 06/14/2012  Smokeless Tobacco  . Never Used   ROS: Per HPI   Objective:    BP 121/66   Pulse 76   Temp 97.9 F (36.6 C) (Oral)   Ht '5\' 2"'$  (1.575 m)   Wt 151 lb (68.5 kg)   BMI 27.62 kg/m   Wt Readings from Last 3 Encounters:  11/03/16 151 lb (68.5 kg)  08/16/16 145 lb 6.4 oz (66 kg)  07/21/16 149 lb (67.6 kg)    Gen: NAD, alert, cooperative with exam, NCAT EYES: EOMI, no conjunctival injection, or no icterus ENT:  TMs effusion L TM, dull gray b/l, OP without erythema LYMPH: no cervical LAD CV: NRRR, normal S1/S2 Resp: CTABL, no wheezes, normal WOB Ext: No edema, warm Neuro: Alert and oriented, strength equal b/l UE and LE, coordination grossly normal MSK: L anterior lower ribs more prominent than R, non-tender, no discrete mass palpated Breast: nl exam b/l  Assessment & Plan:  [place] was seen today for breast mass.  Diagnoses and all orders for this visit:  Chest wall mass CXR unrevealing Will get mammogram, CT scan may be needed -     DG Chest 2 View; Future  Breast mass -     MM Digital Diagnostic Unilat L;  Future  Screening for breast cancer -     MM DIGITAL SCREENING BILATERAL; Future  Pertussis exposure -     azithromycin (ZITHROMAX) 250 MG tablet; Take 2 the first day and then one each day after.   Follow up plan: 3-4 weeks, sooner if needed Assunta Found, MD [place]

## 2016-11-03 NOTE — Patient Instructions (Signed)
[facility] Appointment: [phone]  

## 2016-11-10 ENCOUNTER — Telehealth: Payer: Self-pay | Admitting: *Deleted

## 2016-11-10 DIAGNOSIS — Z20818 Contact with and (suspected) exposure to other bacterial communicable diseases: Secondary | ICD-10-CM

## 2016-11-10 NOTE — Telephone Encounter (Signed)
TC to family for more information for request of med refill. Family not sure of request unless it was an auto request. She is starting to feel better. Will let us know beginning of week if she NTBS again. Denied refill sent back to [facility].

## 2016-11-13 ENCOUNTER — Other Ambulatory Visit: Payer: Self-pay | Admitting: Pediatrics

## 2016-11-13 ENCOUNTER — Other Ambulatory Visit: Payer: Self-pay

## 2016-11-13 DIAGNOSIS — N6320 Unspecified lump in the left breast, unspecified quadrant: Secondary | ICD-10-CM

## 2016-11-27 DIAGNOSIS — J449 Chronic obstructive pulmonary disease, unspecified: Secondary | ICD-10-CM | POA: Diagnosis not present

## 2016-12-12 ENCOUNTER — Ambulatory Visit (HOSPITAL_COMMUNITY)
Admission: RE | Admit: 2016-12-12 | Discharge: 2016-12-12 | Disposition: A | Discharge status: Home / Self Care | Payer: Medicare Other | Source: Ambulatory Visit | Attending: Pediatrics | Admitting: Pediatrics

## 2016-12-12 ENCOUNTER — Encounter (HOSPITAL_COMMUNITY): Payer: Self-pay

## 2016-12-12 DIAGNOSIS — N6320 Unspecified lump in the left breast, unspecified quadrant: Secondary | ICD-10-CM | POA: Insufficient documentation

## 2016-12-12 DIAGNOSIS — R928 Other abnormal and inconclusive findings on diagnostic imaging of breast: Secondary | ICD-10-CM | POA: Diagnosis not present

## 2016-12-18 ENCOUNTER — Ambulatory Visit (INDEPENDENT_AMBULATORY_CARE_PROVIDER_SITE_OTHER): Payer: Medicare Other | Admitting: Family Medicine

## 2016-12-18 ENCOUNTER — Encounter: Payer: Self-pay | Admitting: Family Medicine

## 2016-12-18 VITALS — BP 112/64 | HR 65 | Temp 97.0°F

## 2016-12-18 DIAGNOSIS — R222 Localized swelling, mass and lump, trunk: Secondary | ICD-10-CM | POA: Diagnosis not present

## 2016-12-18 DIAGNOSIS — J209 Acute bronchitis, unspecified: Secondary | ICD-10-CM | POA: Diagnosis not present

## 2016-12-18 DIAGNOSIS — E119 Type 2 diabetes mellitus without complications: Secondary | ICD-10-CM

## 2016-12-18 LAB — BAYER DCA HB A1C WAIVED: HB A1C (BAYER DCA - WAIVED): 6.3 % (ref <7.0)

## 2016-12-18 MED ORDER — DOXYCYCLINE HYCLATE 100 MG PO TABS: 100.0000 mg | ORAL_TABLET | Freq: Two times a day (BID) | ORAL | 0 refills | Status: DC

## 2016-12-18 NOTE — Patient Instructions (Signed)
Great to see you!  Start doxycyline for the cough.   Come back with any concerns  Come back in 3 month s unless you need [place] sooner.    Acute Bronchitis, Adult Acute bronchitis is when air tubes (bronchi) in the lungs suddenly get swollen. The condition can make it hard to breathe. It can also cause these symptoms:  A cough.  Coughing up clear, yellow, or green mucus.  Wheezing.  Chest congestion.  Shortness of breath.  A fever.  Body aches.  Chills.  A sore throat. Follow these instructions at home: Medicines  Take over-the-counter and prescription medicines only as told by your doctor.  If you were prescribed an antibiotic medicine, take it as told by your doctor. Do not stop taking the antibiotic even if you start to feel better. General instructions  Rest.  Drink enough fluids to keep your pee (urine) clear or pale yellow.  Avoid smoking and secondhand smoke. If you smoke and you need help quitting, ask your doctor. Quitting will help your lungs heal faster.  Use an inhaler, cool mist vaporizer, or humidifier as told by your doctor.  Keep all follow-up visits as told by your doctor. This is important. How is this prevented? To lower your risk of getting this condition again:  Wash your hands often with soap and water. If you cannot use soap and water, use hand sanitizer.  Avoid contact with people who have cold symptoms.  Try not to touch your hands to your mouth, nose, or eyes.  Make sure to get the flu shot every year. Contact a doctor if:  Your symptoms do not get better in 2 weeks. Get help right away if:  You cough up blood.  You have chest pain.  You have very bad shortness of breath.  You become dehydrated.  You faint (pass out) or keep feeling like you are going to pass out.  You keep throwing up (vomiting).  You have a very bad headache.  Your fever or chills gets worse. This information is not intended to replace advice given to  you by your health care provider. Make sure you discuss any questions you have with your health care provider. Document Released: 04/10/2008 Document Revised: 05/31/2016 Document Reviewed: 04/12/2016 Elsevier Interactive Patient Education  2017 [employer].

## 2016-12-18 NOTE — Progress Notes (Signed)
   HPI  Patient presents today here for follow-up chronic medical conditions and cough.  Cough Patient and daughter-in-law are present. She states that she's had cough for about 6 weeks now. She was treated with azithromycin 1 course and had improvement for about one week. For the last 3-4 weeks she's had persistent dry cough. No shortness of breath, complains of central chest pain with cough. She's eating and drinking normally.  Left abdominal wall abnormality Patient and the patient's daughter-in-law that are present feel that this is a protuberant rib from scoliosis.   PMH: Smoking status noted ROS: Per HPI  Objective: BP 112/64   Pulse 65   Temp 97 F (36.1 C) (Oral)  Gen: NAD, alert, cooperative with exam HEENT: NCAT, moist oral mucosa, TMs normal bilaterally CV: RRR, good S1/S2, no murmur Resp: CTABL, no wheezes, non-labored Abd: SNTND, BS present, no guarding or organomegaly- left upper abdominal quadrant with more prominent rib compared to the right, the area in question can be palpated above and below (deep to the area)similar and consistent with bone. Ext: No edema, warm Neuro: Alert and oriented, No gross deficits  Assessment and plan:  # Abdominal wall mass Consistent with protuberant rib, likely asymmetric due to scoliosis Family is happy with the evaluation of this point I would like to monitor clinically for now.  # Acute bronchitis Treatment doxycycline Continue over-the-counter cough medication as needed Lung exam is reassuring, treated more aggressively than average due to second sickness, patient had one week improvement then declined again to current illness. Well-appearing, well-hydrated  # Type 2 diabetes Well-controlled with only diet Continue diet control and watchful waiting A1c 6.3, improved from 6.4 three months ago    Orders Placed This Encounter  Procedures  . Bayer DCA Hb A1c Waived  . BMP8+EGFR  . CBC with Differential/Platelet     Meds ordered this encounter  Medications  . doxycycline (VIBRA-TABS) 100 MG tablet    Sig: Take 1 tablet (100 mg total) by mouth 2 (two) times daily. 1 po bid    Dispense:  20 tablet    Refill:  0    Laroy Apple, MD [facility] 12/18/2016, 2:25 PM

## 2016-12-19 LAB — CBC WITH DIFFERENTIAL/PLATELET
BASOS: 0 %
Basophils Absolute: 0 10*3/uL (ref 0.0–0.2)
EOS (ABSOLUTE): 0.1 10*3/uL (ref 0.0–0.4)
EOS: 1 %
HEMATOCRIT: 35.1 % (ref 34.0–46.6)
HEMOGLOBIN: 11 g/dL — AB (ref 11.1–15.9)
IMMATURE GRANS (ABS): 0 10*3/uL (ref 0.0–0.1)
Immature Granulocytes: 0 %
LYMPHS: 31 %
Lymphocytes Absolute: 2.2 10*3/uL (ref 0.7–3.1)
MCH: 27.4 pg (ref 26.6–33.0)
MCHC: 31.3 g/dL — ABNORMAL LOW (ref 31.5–35.7)
MCV: 87 fL (ref 79–97)
Monocytes Absolute: 0.5 10*3/uL (ref 0.1–0.9)
Monocytes: 7 %
NEUTROS PCT: 61 %
Neutrophils Absolute: 4.1 10*3/uL (ref 1.4–7.0)
Platelets: 227 10*3/uL (ref 150–379)
RBC: 4.02 x10E6/uL (ref 3.77–5.28)
RDW: 14.7 % (ref 12.3–15.4)
WBC: 6.9 10*3/uL (ref 3.4–10.8)

## 2016-12-19 LAB — BMP8+EGFR
BUN/Creatinine Ratio: 13 (ref 12–28)
BUN: 36 mg/dL — ABNORMAL HIGH (ref 8–27)
CALCIUM: 9.2 mg/dL (ref 8.7–10.3)
CO2: 24 mmol/L (ref 18–29)
CREATININE: 2.78 mg/dL — AB (ref 0.57–1.00)
Chloride: 106 mmol/L (ref 96–106)
GFR, EST AFRICAN AMERICAN: 18 mL/min/{1.73_m2} — AB (ref >59)
GFR, EST NON AFRICAN AMERICAN: 16 mL/min/{1.73_m2} — AB (ref >59)
Glucose: 181 mg/dL — ABNORMAL HIGH (ref 65–99)
Potassium: 4.3 mmol/L (ref 3.5–5.2)
Sodium: 145 mmol/L — ABNORMAL HIGH (ref 134–144)

## 2016-12-21 DIAGNOSIS — E441 Mild protein-calorie malnutrition: Secondary | ICD-10-CM | POA: Diagnosis not present

## 2016-12-28 DIAGNOSIS — J449 Chronic obstructive pulmonary disease, unspecified: Secondary | ICD-10-CM | POA: Diagnosis not present

## 2016-12-29 ENCOUNTER — Other Ambulatory Visit: Payer: Self-pay | Admitting: Family Medicine

## 2016-12-31 ENCOUNTER — Other Ambulatory Visit: Payer: Self-pay | Admitting: Family Medicine

## 2017-01-11 DIAGNOSIS — E213 Hyperparathyroidism, unspecified: Secondary | ICD-10-CM | POA: Diagnosis not present

## 2017-01-11 DIAGNOSIS — E119 Type 2 diabetes mellitus without complications: Secondary | ICD-10-CM | POA: Diagnosis not present

## 2017-01-11 DIAGNOSIS — I10 Essential (primary) hypertension: Secondary | ICD-10-CM | POA: Diagnosis not present

## 2017-01-11 DIAGNOSIS — N184 Chronic kidney disease, stage 4 (severe): Secondary | ICD-10-CM | POA: Diagnosis not present

## 2017-01-13 ENCOUNTER — Other Ambulatory Visit: Payer: Self-pay | Admitting: Family Medicine

## 2017-01-15 DIAGNOSIS — E441 Mild protein-calorie malnutrition: Secondary | ICD-10-CM | POA: Diagnosis not present

## 2017-01-20 ENCOUNTER — Other Ambulatory Visit: Payer: Self-pay | Admitting: Family Medicine

## 2017-01-20 DIAGNOSIS — M810 Age-related osteoporosis without current pathological fracture: Secondary | ICD-10-CM

## 2017-01-25 DIAGNOSIS — J449 Chronic obstructive pulmonary disease, unspecified: Secondary | ICD-10-CM | POA: Diagnosis not present

## 2017-02-15 DIAGNOSIS — E441 Mild protein-calorie malnutrition: Secondary | ICD-10-CM | POA: Diagnosis not present

## 2017-02-25 DIAGNOSIS — J449 Chronic obstructive pulmonary disease, unspecified: Secondary | ICD-10-CM | POA: Diagnosis not present

## 2017-02-28 ENCOUNTER — Encounter: Payer: Self-pay | Admitting: Vascular Surgery

## 2017-03-09 ENCOUNTER — Other Ambulatory Visit: Payer: Self-pay

## 2017-03-09 ENCOUNTER — Ambulatory Visit (INDEPENDENT_AMBULATORY_CARE_PROVIDER_SITE_OTHER): Payer: Medicare Other | Admitting: Vascular Surgery

## 2017-03-09 ENCOUNTER — Encounter: Payer: Self-pay | Admitting: Vascular Surgery

## 2017-03-09 VITALS — BP 116/59 | HR 63 | Temp 98.4°F | Resp 20 | Ht 62.0 in | Wt 154.0 lb

## 2017-03-09 DIAGNOSIS — N184 Chronic kidney disease, stage 4 (severe): Secondary | ICD-10-CM

## 2017-03-09 NOTE — Progress Notes (Signed)
Patient ID: April [name], female   DOB: 1935-05-28, 82 y.o.   MRN: [id number]  Reason for Consult: Re-evaluation (F/u appt to eval for dialysis access placement, UE art duplex/UE vein mapping 9/17)   Referred by Timmothy Euler, MD  Subjective:     HPI:  April [name] is a 82 y.o. female previously evaluated for permanent AV access. That procedure was never scheduled and she now returns in need of AV fistula. She is right-hand dominant. She is has not had any recent changes to her medical history. She is overall doing well without complaints about today's visit. She is a former smoker does not take any blood thinners.  Past Medical History:  Diagnosis Date  . Chronic kidney disease   . Diabetes mellitus   . Hypertension   . Hyperthyroidism   . Perforated ulcer ([place])    No family history on file. Past Surgical History:  Procedure Laterality Date  . LAPAROTOMY  06/10/2012   Procedure: Omental patch of perforated ulcer, EXPLORATORY LAPAROTOMY;  Surgeon: Odis Hollingshead, MD;  Location: [facility];  Service: General;  Laterality: N/A;  closure of perforated gastric ulcer  . TUMOR REMOVAL     of her sinus cavity    Short Social History:  Social History  Substance Use Topics  . Smoking status: Former Smoker    Packs/day: 1.00    Years: 52.00    Types: Cigarettes    Quit date: 06/14/2012  . Smokeless tobacco: Never Used  . Alcohol use No    Allergies  Allergen Reactions  . Nsaids     Pt had perforated ulcer - patched [id number]    Current Outpatient Prescriptions  Medication Sig Dispense Refill  . alendronate (FOSAMAX) 70 MG tablet TAKE 1 TABLET BY MOUTH EVERY 7 DAYS WITH A FULL GLASS OF WATER OR ON AN EMPTY STOMACH 12 tablet 1  . atorvastatin (LIPITOR) 20 MG tablet TAKE 1 TABLET (20 MG TOTAL) BY MOUTH DAILY. FOR HYPERLIPIDEMIA 90 tablet 3  . calcium carbonate (OSCAL) 1500 (600 Ca) MG TABS tablet Take 1,500 mg by mouth daily with breakfast.     . cholecalciferol (VITAMIN D)  1000 UNITS tablet Take 2 tablets (2,000 Units total) by mouth daily. For Vit D deficiency (Patient taking differently: Take 1,000 Units by mouth daily. For Vit D deficiency) 180 tablet 3  . Cinacalcet HCl (SENSIPAR PO) Take 120 mg by mouth daily.    Marland Kitchen docusate sodium (COLACE) 100 MG capsule Take 100 mg by mouth daily.    Marland Kitchen doxycycline (VIBRA-TABS) 100 MG tablet Take 1 tablet (100 mg total) by mouth 2 (two) times daily. 1 po bid 20 tablet 0  . feeding supplement, GLUCERNA SHAKE, (GLUCERNA SHAKE) LIQD Take 237 mLs by mouth 2 (two) times daily between meals. 60 Can 5  . pantoprazole (PROTONIX) 40 MG tablet TAKE 1 TABLET (40 MG TOTAL) BY MOUTH DAILY. 90 tablet 1  . ULORIC 40 MG tablet TAKE 1 TABLET (40 MG TOTAL) BY MOUTH DAILY. FOR GOUT 90 tablet 0   No current facility-administered medications for this visit.     Review of Systems  Constitutional:  Constitutional negative. HENT:       Difficulty hearing Cardiovascular: Cardiovascular negative.  Musculoskeletal: Musculoskeletal negative.        Objective:  Objective   Vitals:   03/09/17 1509  BP: (!) 116/59  Pulse: 63  Resp: 20  Temp: 98.4 F (36.9 C)  TempSrc: Oral  SpO2: 94%  Weight:  154 lb (69.9 kg)  Height: '5\' 2"'$  (1.575 m)   Body mass index is 28.17 kg/m.  Physical Exam  Constitutional: She appears well-developed.  Cardiovascular: Regular rhythm.   Palpable ulnar pulse  Pulmonary/Chest: Effort normal.  Abdominal: Soft.  Musculoskeletal: Normal range of motion. She exhibits no edema.  Neurological: She is alert.  Skin: Skin is dry.  Psychiatric: She has a normal mood and affect. Her behavior is normal. Judgment and thought content normal.    Data: Vein mapping and arterial duplex from September reviewed should have adequate cephalic vein on the left for AV fistula creation.     Assessment/Plan:     82 year old female returns to office for planning of AV access. She is right-hand dominant previously of vein for  either cephalic or basilic vein fistula on the left. We will plan AV fistula versus graft in the near future. I discussed risk and benefits including primary nonfunction, steal, IMN, need for continued procedures. She demonstrated good understanding and we will proceed soon.      Waynetta Sandy MD [facility]

## 2017-03-17 DIAGNOSIS — E441 Mild protein-calorie malnutrition: Secondary | ICD-10-CM | POA: Diagnosis not present

## 2017-03-19 DIAGNOSIS — K259 Gastric ulcer, unspecified as acute or chronic, without hemorrhage or perforation: Secondary | ICD-10-CM | POA: Diagnosis not present

## 2017-03-20 ENCOUNTER — Encounter: Payer: Self-pay | Admitting: Family Medicine

## 2017-03-20 ENCOUNTER — Ambulatory Visit (INDEPENDENT_AMBULATORY_CARE_PROVIDER_SITE_OTHER): Payer: Medicare Other | Admitting: Family Medicine

## 2017-03-20 VITALS — BP 133/65 | HR 59 | Temp 97.2°F | Ht 62.0 in | Wt 148.2 lb

## 2017-03-20 DIAGNOSIS — E119 Type 2 diabetes mellitus without complications: Secondary | ICD-10-CM

## 2017-03-20 LAB — BAYER DCA HB A1C WAIVED: HB A1C (BAYER DCA - WAIVED): 6.5 % (ref <7.0)

## 2017-03-20 MED ORDER — FEBUXOSTAT 40 MG PO TABS: 40.0000 mg | ORAL_TABLET | Freq: Every day | ORAL | 3 refills | Status: DC

## 2017-03-20 NOTE — Patient Instructions (Signed)
Great to see you!  It looks like everything is going well with your diabetes.   Please come bcak in 3-4 months for follow up diabetes.

## 2017-03-20 NOTE — Progress Notes (Signed)
   HPI  Patient presents today here for follow-up diabetes.  Patient feels well today and has no complaints.  She is not checking her blood sugar and watches her diet minimally. She does not have a medications.  She denies any redness or tearing feet. She has had her annual eye exam and is due in the next few months.  PMH: Smoking status noted ROS: Per HPI  Objective: BP 133/65   Pulse (!) 59   Temp 97.2 F (36.2 C) (Oral)   Ht '5\' 2"'$  (1.575 m)   Wt 148 lb 3.2 oz (67.2 kg)   BMI 27.11 kg/m  Gen: NAD, alert, cooperative with exam HEENT: NCAT CV: RRR, good S1/S2, no murmur Resp: CTABL, no wheezes, non-labored Ext: No edema, warm Neuro: Alert and oriented, No gross deficits  Assessment and plan:  # Type 2 diabetes Well controlled without medications, diet control No changes Recommended annual eye exam RTC in 4 months for follow up  Orders Placed This Encounter  Procedures  . Bayer DCA Hb A1c Waived  . Microalbumin / creatinine urine ratio    No orders of the defined types were placed in this encounter.   Laroy Apple, MD [facility] 03/20/2017, 2:20 PM

## 2017-03-21 LAB — MICROALBUMIN / CREATININE URINE RATIO
CREATININE, UR: 122.4 mg/dL
Microalb/Creat Ratio: 16 mg/g creat (ref 0.0–30.0)
Microalbumin, Urine: 19.6 ug/mL

## 2017-03-30 ENCOUNTER — Encounter (HOSPITAL_COMMUNITY): Payer: Self-pay | Admitting: *Deleted

## 2017-03-30 NOTE — Pre-Procedure Instructions (Signed)
    JOHNANNA BAKKE  03/30/2017      [id number] - [place], [place] - [address] [place] [address] Phone: [phone] Fax: [phone]  [id number] - [place], [place] [address] Phone: [phone] Fax: [phone]  [place], [place] [address] Phone: [phone] Fax: [phone]  [id number] - [place], [place] [address] [place] [address] Phone: [phone] Fax: [phone]    Your procedure is scheduled on  Tuesday, May 29th   Report to [facility] Admitting at 7:00 AM  Call this number if you have problems the morning of surgery:  [phone]   Remember:  Do not eat food or drink liquids after midnight Monday  Take these medicines the morning of surgery with A SIP OF WATER : Protonix   Do not wear jewelry, make-up or nail polish.  Do not wear lotions, powders, or perfumes, or deoderant.  Do not shave 48 hours prior to surgery.  Men may shave face and neck.  Do not bring valuables to the hospital.  [facility] is not responsible for any belongings or valuables.  Contacts, dentures or bridgework may not be worn into surgery.  Leave your suitcase in the car.  After surgery it may be brought to your room.  For patients admitted to the hospital, discharge time will be determined by your treatment team.  Patients discharged the day of surgery will not be allowed to drive home, will need someone to stay with you for the first 24 hrs.   Please read over the following fact sheets that you were given. Pain Booklet

## 2017-04-03 ENCOUNTER — Ambulatory Visit (HOSPITAL_COMMUNITY): Payer: Medicare Other | Admitting: Anesthesiology

## 2017-04-03 ENCOUNTER — Ambulatory Visit (HOSPITAL_COMMUNITY)
Admission: RE | Admit: 2017-04-03 | Discharge: 2017-04-03 | Disposition: A | Discharge status: Home / Self Care | Payer: Medicare Other | Source: Ambulatory Visit | Attending: Vascular Surgery | Admitting: Vascular Surgery

## 2017-04-03 ENCOUNTER — Encounter (HOSPITAL_COMMUNITY)
Admission: RE | Disposition: A | Discharge status: Home / Self Care | Payer: Self-pay | Source: Ambulatory Visit | Attending: Vascular Surgery

## 2017-04-03 ENCOUNTER — Telehealth: Payer: Self-pay | Admitting: Vascular Surgery

## 2017-04-03 DIAGNOSIS — I129 Hypertensive chronic kidney disease with stage 1 through stage 4 chronic kidney disease, or unspecified chronic kidney disease: Secondary | ICD-10-CM | POA: Diagnosis not present

## 2017-04-03 DIAGNOSIS — Z79899 Other long term (current) drug therapy: Secondary | ICD-10-CM | POA: Insufficient documentation

## 2017-04-03 DIAGNOSIS — E1122 Type 2 diabetes mellitus with diabetic chronic kidney disease: Secondary | ICD-10-CM | POA: Insufficient documentation

## 2017-04-03 DIAGNOSIS — K219 Gastro-esophageal reflux disease without esophagitis: Secondary | ICD-10-CM | POA: Diagnosis not present

## 2017-04-03 DIAGNOSIS — I120 Hypertensive chronic kidney disease with stage 5 chronic kidney disease or end stage renal disease: Secondary | ICD-10-CM | POA: Insufficient documentation

## 2017-04-03 DIAGNOSIS — E785 Hyperlipidemia, unspecified: Secondary | ICD-10-CM | POA: Diagnosis not present

## 2017-04-03 DIAGNOSIS — J449 Chronic obstructive pulmonary disease, unspecified: Secondary | ICD-10-CM | POA: Insufficient documentation

## 2017-04-03 DIAGNOSIS — Z87891 Personal history of nicotine dependence: Secondary | ICD-10-CM | POA: Insufficient documentation

## 2017-04-03 DIAGNOSIS — E0590 Thyrotoxicosis, unspecified without thyrotoxic crisis or storm: Secondary | ICD-10-CM | POA: Diagnosis not present

## 2017-04-03 DIAGNOSIS — N184 Chronic kidney disease, stage 4 (severe): Secondary | ICD-10-CM | POA: Diagnosis not present

## 2017-04-03 DIAGNOSIS — N186 End stage renal disease: Secondary | ICD-10-CM | POA: Insufficient documentation

## 2017-04-03 DIAGNOSIS — N185 Chronic kidney disease, stage 5: Secondary | ICD-10-CM | POA: Diagnosis not present

## 2017-04-03 DIAGNOSIS — E119 Type 2 diabetes mellitus without complications: Secondary | ICD-10-CM | POA: Diagnosis not present

## 2017-04-03 HISTORY — DX: Chronic obstructive pulmonary disease, unspecified: J44.9

## 2017-04-03 HISTORY — PX: AV FISTULA PLACEMENT: SHX1204

## 2017-04-03 HISTORY — DX: Insomnia, unspecified: G47.00

## 2017-04-03 LAB — GLUCOSE, CAPILLARY
GLUCOSE-CAPILLARY: 124 mg/dL — AB (ref 65–99)
GLUCOSE-CAPILLARY: 119 mg/dL — AB (ref 65–99)

## 2017-04-03 LAB — POCT I-STAT 4, (NA,K, GLUC, HGB,HCT)
Glucose, Bld: 120 mg/dL — ABNORMAL HIGH (ref 65–99)
HEMATOCRIT: 32 % — AB (ref 36.0–46.0)
HEMOGLOBIN: 10.9 g/dL — AB (ref 12.0–15.0)
POTASSIUM: 4 mmol/L (ref 3.5–5.1)
SODIUM: 145 mmol/L (ref 135–145)

## 2017-04-03 SURGERY — ARTERIOVENOUS (AV) FISTULA CREATION
Anesthesia: Monitor Anesthesia Care | Site: Arm Upper | Laterality: Left

## 2017-04-03 MED ORDER — ONDANSETRON HCL 4 MG/2ML IJ SOLN
INTRAMUSCULAR | Status: AC
  Filled 2017-04-03: qty 4

## 2017-04-03 MED ORDER — DEXTROSE 5 % IV SOLN
1.5000 g | INTRAVENOUS | Status: AC
  Administered 2017-04-03: 1.5 g via INTRAVENOUS
  Filled 2017-04-03: qty 1.5

## 2017-04-03 MED ORDER — PROPOFOL 10 MG/ML IV BOLUS
INTRAVENOUS | Status: AC
  Filled 2017-04-03: qty 20

## 2017-04-03 MED ORDER — PHENYLEPHRINE 40 MCG/ML (10ML) SYRINGE FOR IV PUSH (FOR BLOOD PRESSURE SUPPORT)
PREFILLED_SYRINGE | INTRAVENOUS | Status: AC
  Filled 2017-04-03: qty 10

## 2017-04-03 MED ORDER — SUGAMMADEX SODIUM 200 MG/2ML IV SOLN
INTRAVENOUS | Status: AC
  Filled 2017-04-03: qty 2

## 2017-04-03 MED ORDER — CHLORHEXIDINE GLUCONATE 4 % EX LIQD: 1.0000 "application " | Freq: Once | CUTANEOUS | Status: DC

## 2017-04-03 MED ORDER — 0.9 % SODIUM CHLORIDE (POUR BTL) OPTIME
TOPICAL | Status: DC | PRN
  Administered 2017-04-03: 1000 mL

## 2017-04-03 MED ORDER — LIDOCAINE HCL (CARDIAC) 20 MG/ML IV SOLN
INTRAVENOUS | Status: DC | PRN
  Administered 2017-04-03: 40 mg via INTRAVENOUS

## 2017-04-03 MED ORDER — PROPOFOL 500 MG/50ML IV EMUL
INTRAVENOUS | Status: DC | PRN
  Administered 2017-04-03: 50 ug/kg/min via INTRAVENOUS

## 2017-04-03 MED ORDER — PROPOFOL 10 MG/ML IV BOLUS
INTRAVENOUS | Status: DC | PRN
  Administered 2017-04-03 (×2): 20 mg via INTRAVENOUS

## 2017-04-03 MED ORDER — FENTANYL CITRATE (PF) 100 MCG/2ML IJ SOLN
INTRAMUSCULAR | Status: AC
  Filled 2017-04-03: qty 2

## 2017-04-03 MED ORDER — PROTAMINE SULFATE 10 MG/ML IV SOLN
INTRAVENOUS | Status: AC
  Filled 2017-04-03: qty 5

## 2017-04-03 MED ORDER — LIDOCAINE-EPINEPHRINE (PF) 1 %-1:200000 IJ SOLN
INTRAMUSCULAR | Status: DC | PRN
  Administered 2017-04-03: 15 mL

## 2017-04-03 MED ORDER — ROCURONIUM BROMIDE 10 MG/ML (PF) SYRINGE
PREFILLED_SYRINGE | INTRAVENOUS | Status: AC
  Filled 2017-04-03: qty 5

## 2017-04-03 MED ORDER — OXYCODONE-ACETAMINOPHEN 5-325 MG PO TABS: 1.0000 | ORAL_TABLET | Freq: Four times a day (QID) | ORAL | 0 refills | Status: DC | PRN

## 2017-04-03 MED ORDER — PHENYLEPHRINE HCL 10 MG/ML IJ SOLN
INTRAMUSCULAR | Status: DC | PRN
  Administered 2017-04-03 (×2): 80 ug via INTRAVENOUS

## 2017-04-03 MED ORDER — HEPARIN SODIUM (PORCINE) 1000 UNIT/ML IJ SOLN
INTRAMUSCULAR | Status: AC
  Filled 2017-04-03: qty 1

## 2017-04-03 MED ORDER — FENTANYL CITRATE (PF) 100 MCG/2ML IJ SOLN
INTRAMUSCULAR | Status: DC | PRN
  Administered 2017-04-03: 25 ug via INTRAVENOUS
  Administered 2017-04-03: 50 ug via INTRAVENOUS
  Administered 2017-04-03: 25 ug via INTRAVENOUS

## 2017-04-03 MED ORDER — SODIUM CHLORIDE 0.9 % IV SOLN
INTRAVENOUS | Status: DC | PRN
  Administered 2017-04-03: 11:00:00

## 2017-04-03 MED ORDER — SODIUM CHLORIDE 0.9 % IV SOLN
INTRAVENOUS | Status: DC
  Administered 2017-04-03 (×3): via INTRAVENOUS

## 2017-04-03 MED ORDER — FENTANYL CITRATE (PF) 100 MCG/2ML IJ SOLN
25.0000 ug | INTRAMUSCULAR | Status: DC | PRN
  Administered 2017-04-03 (×2): 25 ug via INTRAVENOUS
  Administered 2017-04-03: 50 ug via INTRAVENOUS

## 2017-04-03 MED ORDER — LIDOCAINE 2% (20 MG/ML) 5 ML SYRINGE
INTRAMUSCULAR | Status: AC
  Filled 2017-04-03: qty 5

## 2017-04-03 MED ORDER — LIDOCAINE-EPINEPHRINE (PF) 1 %-1:200000 IJ SOLN
INTRAMUSCULAR | Status: AC
  Filled 2017-04-03: qty 30

## 2017-04-03 MED ORDER — FENTANYL CITRATE (PF) 250 MCG/5ML IJ SOLN
INTRAMUSCULAR | Status: AC
  Filled 2017-04-03: qty 5

## 2017-04-03 SURGICAL SUPPLY — 25 items
ARMBAND PINK RESTRICT EXTREMIT (MISCELLANEOUS) ×3 IMPLANT
CANISTER SUCT 3000ML PPV (MISCELLANEOUS) ×3 IMPLANT
CLIP TI MEDIUM 6 (CLIP) ×3 IMPLANT
CLIP TI WIDE RED SMALL 6 (CLIP) ×3 IMPLANT
COVER PROBE W GEL 5X96 (DRAPES) IMPLANT
DERMABOND ADVANCED (GAUZE/BANDAGES/DRESSINGS) ×2
DERMABOND ADVANCED .7 DNX12 (GAUZE/BANDAGES/DRESSINGS) ×1 IMPLANT
ELECT REM PT RETURN 9FT ADLT (ELECTROSURGICAL) ×3
ELECTRODE REM PT RTRN 9FT ADLT (ELECTROSURGICAL) ×1 IMPLANT
GLOVE BIO SURGEON STRL SZ7.5 (GLOVE) ×3 IMPLANT
GOWN STRL REUS W/ TWL LRG LVL3 (GOWN DISPOSABLE) ×2 IMPLANT
GOWN STRL REUS W/ TWL XL LVL3 (GOWN DISPOSABLE) ×1 IMPLANT
GOWN STRL REUS W/TWL LRG LVL3 (GOWN DISPOSABLE) ×4
GOWN STRL REUS W/TWL XL LVL3 (GOWN DISPOSABLE) ×2
KIT BASIN OR (CUSTOM PROCEDURE TRAY) ×3 IMPLANT
KIT ROOM TURNOVER OR (KITS) ×3 IMPLANT
NS IRRIG 1000ML POUR BTL (IV SOLUTION) ×3 IMPLANT
PACK CV ACCESS (CUSTOM PROCEDURE TRAY) ×3 IMPLANT
PAD ARMBOARD 7.5X6 YLW CONV (MISCELLANEOUS) ×6 IMPLANT
SUT MNCRL AB 4-0 PS2 18 (SUTURE) ×3 IMPLANT
SUT PROLENE 6 0 BV (SUTURE) ×9 IMPLANT
SUT VIC AB 3-0 SH 27 (SUTURE) ×2
SUT VIC AB 3-0 SH 27X BRD (SUTURE) ×1 IMPLANT
UNDERPAD 30X30 (UNDERPADS AND DIAPERS) ×3 IMPLANT
WATER STERILE IRR 1000ML POUR (IV SOLUTION) ×3 IMPLANT

## 2017-04-03 NOTE — Anesthesia Procedure Notes (Signed)
Procedure Name: MAC Date/Time: 04/03/2017 10:25 AM Performed by: Neldon Newport Pre-anesthesia Checklist: Timeout performed, Patient being monitored, Suction available, Emergency Drugs available and Patient identified Patient Re-evaluated:Patient Re-evaluated prior to inductionOxygen Delivery Method: Simple face mask Placement Confirmation: breath sounds checked- equal and bilateral and positive ETCO2 Dental Injury: Teeth and Oropharynx as per pre-operative assessment

## 2017-04-03 NOTE — Telephone Encounter (Signed)
-----   Message from Mena Goes, RN sent at 04/03/2017 11:49 AM EDT ----- Regarding: 6-7 weeks    ----- Message ----- From: Ulyses Amor, PA-C Sent: 04/03/2017  11:27 AM To: [facility] Charge Pool  F/U in 6-7 weeks with Dr. Donzetta Matters or PA  When Donzetta Matters is in office same day.s/p left fistula creation.

## 2017-04-03 NOTE — Telephone Encounter (Signed)
Sched appt 05/18/17 at 11:00. Spoke to pt to confirm appt.

## 2017-04-03 NOTE — Anesthesia Postprocedure Evaluation (Signed)
Anesthesia Post Note  Patient: April [name]  Procedure(s) Performed: Procedure(s) (LRB): LEFT BRACHIOCEPHALIC ARTERIOVENOUS (AV)  FISTULA CREATION (Left)  Patient location during evaluation: PACU Anesthesia Type: MAC Level of consciousness: awake and alert Pain management: pain level controlled Vital Signs Assessment: post-procedure vital signs reviewed and stable Respiratory status: spontaneous breathing, nonlabored ventilation, respiratory function stable and patient connected to nasal cannula oxygen Cardiovascular status: stable and blood pressure returned to baseline Anesthetic complications: no       Last Vitals:  Vitals:   04/03/17 1215 04/03/17 1220  BP: (!) 130/55 (!) 124/96  Pulse: 62 70  Resp: 17 (!) 21  Temp:  36.7 C    Last Pain:  Vitals:   04/03/17 1215  TempSrc:   PainSc: Asleep                 Natilee Gauer,JAMES TERRILL

## 2017-04-03 NOTE — Op Note (Signed)
    Patient name: April [name] MRN: [id number] DOB: 27-Oct-1935 Sex: female  04/03/2017 Pre-operative Diagnosis: chronic kidney disease Post-operative diagnosis:  Same Surgeon:  Eda Paschal. Donzetta Matters, MD Assistant: Gerri Lins, PA Procedure Performed: Left arm brachiocephalic arteriovenous fistula  Indications:  82 year old female with chronic kidney disease now indicated for AV fistula placement. We discussed risks, benefits, and alternatives she has signed consent agrees to the above.  Findings: Cephalic vein was of adequate size in the antecubitum at 4 mm externally and dilated to 3.5. There is likely a stenosis in the mid vein in the upper arm. The artery has adequate external diameter 3 mm. A completion a palpable ulnar pulse and there is pulsatility in the runoff vein as well as a high resistance outflow signal on Doppler.   Procedure:  The patient was identified in the holding area and taken to the operating room where she is placed supine on the operating table in Mac anesthesia induced. She was given antibiotics sterilely prepped and draped in the left arm for access procedure and timeout called. We began with instilling 15 mL 1% lidocaine with epinephrine at the antecubitum. I then mapped out the cephalic vein in the upper arm which was noted to be adequate size and patent throughout its course. Then made a transverse incision dissected down to the vein ligated one branch between ties. The vein was dissected up cephalad on the arm for several centimeters. Then divided the deep fascia identified our brachial artery encircled this with vessel loop. The vein was clamped proximally transected distally and then dilated up to 3.5 mm. The end was spatulated was flushed with heparinized saline. I then clamped the artery just vein proximally opened longitudinally flushed proximally distally with heparinized saline and reclamped. The vein was then sewn end-to-side to the artery with 6-0 Prolene suture.  Prior to completion all vessels were flushed. We then completed our anastomosis released our clamps to flush through the venous side. There was palpable pulse at the ulnar artery at the wrist confirmed with Doppler as well as palpable pulsatility throughout the fistula also confirmed with Doppler. I did explore the vein for several centimeters cephalad of the arm it was not kinked and it did have palpable pulses throughout. With this I elected to obtain hemostasis closed with 2 layers with Vicryl Monocryl. Dermabond was placed level skin patient tolerated procedure well without immediate complication.  Blood loss: 20cc    Analucia Hush C. Donzetta Matters, MD [facility] Office: [phone] Pager: [phone]

## 2017-04-03 NOTE — Transfer of Care (Signed)
Immediate Anesthesia Transfer of Care Note  Patient: April [name]  Procedure(s) Performed: Procedure(s): LEFT BRACHIOCEPHALIC ARTERIOVENOUS (AV)  FISTULA CREATION (Left)  Patient Location: PACU  Anesthesia Type:MAC  Level of Consciousness: awake and alert   Airway & Oxygen Therapy: Patient Spontanous Breathing and Patient connected to nasal cannula oxygen  Post-op Assessment: Report given to RN, Post -op Vital signs reviewed and stable and Patient moving all extremities X 4  Post vital signs: Reviewed and stable  Last Vitals:  Vitals:   04/03/17 0728  BP: 139/77  Pulse: (!) 55  Resp: 18  Temp: 36.4 C    Last Pain:  Vitals:   04/03/17 0728  TempSrc: Oral      Patients Stated Pain Goal: 3 (08/13/11 1975)  Complications: No apparent anesthesia complications

## 2017-04-03 NOTE — H&P (Signed)
   History and Physical Update  The patient was interviewed and re-examined.  The patient's previous History and Physical has been reviewed and is unchanged from recent office visit. Plan for left arm av fistula.   Brandon C. Donzetta Matters, MD [facility] Office: [phone] Pager: [phone]  04/03/2017, 9:27 AM

## 2017-04-03 NOTE — Anesthesia Preprocedure Evaluation (Addendum)
Anesthesia Evaluation  Patient identified by MRN, date of birth, ID band Patient awake    Reviewed: Allergy & Precautions, NPO status , Patient's Chart, lab work & pertinent test results  Airway Mallampati: II  TM Distance: <3 FB Neck ROM: Full    Dental no notable dental hx.    Pulmonary COPD, former smoker,    breath sounds clear to auscultation       Cardiovascular hypertension,  Rhythm:Regular Rate:Normal     Neuro/Psych    GI/Hepatic PUD, GERD  ,  Endo/Other  diabetesHyperthyroidism   Renal/GU ESRFRenal disease     Musculoskeletal   Abdominal (+) + obese,   Peds  Hematology   Anesthesia Other Findings   Reproductive/Obstetrics                            Anesthesia Physical Anesthesia Plan  ASA: III  Anesthesia Plan: MAC   Post-op Pain Management:    Induction: Intravenous  Airway Management Planned: Natural Airway and Simple Face Mask  Additional Equipment:   Intra-op Plan:   Post-operative Plan:   Informed Consent:   Plan Discussed with: CRNA  Anesthesia Plan Comments:         Anesthesia Quick Evaluation

## 2017-04-04 ENCOUNTER — Encounter (HOSPITAL_COMMUNITY): Payer: Self-pay | Admitting: Vascular Surgery

## 2017-04-05 ENCOUNTER — Ambulatory Visit: Payer: Medicare Other | Admitting: Family Medicine

## 2017-04-05 ENCOUNTER — Ambulatory Visit (INDEPENDENT_AMBULATORY_CARE_PROVIDER_SITE_OTHER): Payer: Medicare Other | Admitting: Family Medicine

## 2017-04-05 DIAGNOSIS — K259 Gastric ulcer, unspecified as acute or chronic, without hemorrhage or perforation: Secondary | ICD-10-CM

## 2017-04-05 DIAGNOSIS — E1122 Type 2 diabetes mellitus with diabetic chronic kidney disease: Secondary | ICD-10-CM

## 2017-04-05 DIAGNOSIS — J439 Emphysema, unspecified: Secondary | ICD-10-CM | POA: Diagnosis not present

## 2017-04-05 DIAGNOSIS — I129 Hypertensive chronic kidney disease with stage 1 through stage 4 chronic kidney disease, or unspecified chronic kidney disease: Secondary | ICD-10-CM

## 2017-04-17 DIAGNOSIS — E441 Mild protein-calorie malnutrition: Secondary | ICD-10-CM | POA: Diagnosis not present

## 2017-05-07 ENCOUNTER — Encounter: Payer: Self-pay | Admitting: Vascular Surgery

## 2017-05-16 DIAGNOSIS — N184 Chronic kidney disease, stage 4 (severe): Secondary | ICD-10-CM | POA: Diagnosis not present

## 2017-05-16 DIAGNOSIS — J439 Emphysema, unspecified: Secondary | ICD-10-CM | POA: Diagnosis not present

## 2017-05-16 DIAGNOSIS — H9190 Unspecified hearing loss, unspecified ear: Secondary | ICD-10-CM | POA: Diagnosis not present

## 2017-05-16 DIAGNOSIS — I129 Hypertensive chronic kidney disease with stage 1 through stage 4 chronic kidney disease, or unspecified chronic kidney disease: Secondary | ICD-10-CM | POA: Diagnosis not present

## 2017-05-16 DIAGNOSIS — M6281 Muscle weakness (generalized): Secondary | ICD-10-CM | POA: Diagnosis not present

## 2017-05-16 DIAGNOSIS — E440 Moderate protein-calorie malnutrition: Secondary | ICD-10-CM | POA: Diagnosis not present

## 2017-05-16 DIAGNOSIS — E1122 Type 2 diabetes mellitus with diabetic chronic kidney disease: Secondary | ICD-10-CM | POA: Diagnosis not present

## 2017-05-16 DIAGNOSIS — K259 Gastric ulcer, unspecified as acute or chronic, without hemorrhage or perforation: Secondary | ICD-10-CM | POA: Diagnosis not present

## 2017-05-16 DIAGNOSIS — E785 Hyperlipidemia, unspecified: Secondary | ICD-10-CM | POA: Diagnosis not present

## 2017-05-17 DIAGNOSIS — N184 Chronic kidney disease, stage 4 (severe): Secondary | ICD-10-CM | POA: Diagnosis not present

## 2017-05-17 DIAGNOSIS — E441 Mild protein-calorie malnutrition: Secondary | ICD-10-CM | POA: Diagnosis not present

## 2017-05-17 DIAGNOSIS — I770 Arteriovenous fistula, acquired: Secondary | ICD-10-CM | POA: Diagnosis not present

## 2017-05-17 DIAGNOSIS — I10 Essential (primary) hypertension: Secondary | ICD-10-CM | POA: Diagnosis not present

## 2017-05-17 DIAGNOSIS — E119 Type 2 diabetes mellitus without complications: Secondary | ICD-10-CM | POA: Diagnosis not present

## 2017-05-17 DIAGNOSIS — E213 Hyperparathyroidism, unspecified: Secondary | ICD-10-CM | POA: Diagnosis not present

## 2017-05-18 ENCOUNTER — Encounter: Payer: Self-pay | Admitting: Vascular Surgery

## 2017-05-18 ENCOUNTER — Ambulatory Visit (INDEPENDENT_AMBULATORY_CARE_PROVIDER_SITE_OTHER): Payer: Self-pay | Admitting: Vascular Surgery

## 2017-05-18 VITALS — BP 132/56 | HR 54 | Temp 97.2°F | Resp 18 | Ht 62.0 in | Wt 142.0 lb

## 2017-05-18 DIAGNOSIS — N184 Chronic kidney disease, stage 4 (severe): Secondary | ICD-10-CM

## 2017-05-18 NOTE — Progress Notes (Signed)
Subjective:     Patient ID: April [name], female   DOB: Jan 16, 1935, 82 y.o.   MRN: [id number]  HPI  82 year old female follows up from placement of left brachiocephalic AV fistula in May of this year. She is not on dialysis yet. Her hand feels fine. She has healed up well from her incision.  Review of Systems No complaints today    Objective:   Physical Exam Awake and alert Easily palpable left upper arm thrill with mild pulsatility, still immature and might be deep 2+ left ulnar pulse    Assessment/plan     This letter follows up from recent left brachiocephalic AV fistula. She is doing very well and does not require for dialysis at this time. When it comes time to dialysis she may require fistulogram to evaluate the pulsatility. He can attempt cannulated at the beginning of September which will be 3 months out. She can follow-up here on when necessary basis if it is not accessible when needed.  Kairav Russomanno C. Donzetta Matters, MD [facility] [place] Office: [phone] Pager: [phone]

## 2017-06-17 DIAGNOSIS — E441 Mild protein-calorie malnutrition: Secondary | ICD-10-CM | POA: Diagnosis not present

## 2017-06-20 ENCOUNTER — Other Ambulatory Visit: Payer: Self-pay | Admitting: Family Medicine

## 2017-06-20 DIAGNOSIS — M810 Age-related osteoporosis without current pathological fracture: Secondary | ICD-10-CM

## 2017-06-24 ENCOUNTER — Other Ambulatory Visit: Payer: Self-pay | Admitting: Family Medicine

## 2017-07-06 ENCOUNTER — Ambulatory Visit (INDEPENDENT_AMBULATORY_CARE_PROVIDER_SITE_OTHER): Payer: Medicare Other | Admitting: Family Medicine

## 2017-07-06 DIAGNOSIS — I129 Hypertensive chronic kidney disease with stage 1 through stage 4 chronic kidney disease, or unspecified chronic kidney disease: Secondary | ICD-10-CM | POA: Diagnosis not present

## 2017-07-06 DIAGNOSIS — K259 Gastric ulcer, unspecified as acute or chronic, without hemorrhage or perforation: Secondary | ICD-10-CM | POA: Diagnosis not present

## 2017-07-06 DIAGNOSIS — J439 Emphysema, unspecified: Secondary | ICD-10-CM

## 2017-07-06 DIAGNOSIS — E1122 Type 2 diabetes mellitus with diabetic chronic kidney disease: Secondary | ICD-10-CM

## 2017-07-12 DIAGNOSIS — J439 Emphysema, unspecified: Secondary | ICD-10-CM | POA: Diagnosis not present

## 2017-07-12 DIAGNOSIS — E1122 Type 2 diabetes mellitus with diabetic chronic kidney disease: Secondary | ICD-10-CM | POA: Diagnosis not present

## 2017-07-12 DIAGNOSIS — E785 Hyperlipidemia, unspecified: Secondary | ICD-10-CM | POA: Diagnosis not present

## 2017-07-12 DIAGNOSIS — H9190 Unspecified hearing loss, unspecified ear: Secondary | ICD-10-CM | POA: Diagnosis not present

## 2017-07-12 DIAGNOSIS — N184 Chronic kidney disease, stage 4 (severe): Secondary | ICD-10-CM | POA: Diagnosis not present

## 2017-07-12 DIAGNOSIS — M6281 Muscle weakness (generalized): Secondary | ICD-10-CM | POA: Diagnosis not present

## 2017-07-12 DIAGNOSIS — K259 Gastric ulcer, unspecified as acute or chronic, without hemorrhage or perforation: Secondary | ICD-10-CM | POA: Diagnosis not present

## 2017-07-12 DIAGNOSIS — E440 Moderate protein-calorie malnutrition: Secondary | ICD-10-CM | POA: Diagnosis not present

## 2017-07-12 DIAGNOSIS — I129 Hypertensive chronic kidney disease with stage 1 through stage 4 chronic kidney disease, or unspecified chronic kidney disease: Secondary | ICD-10-CM | POA: Diagnosis not present

## 2017-07-13 ENCOUNTER — Telehealth: Payer: Self-pay

## 2017-07-13 NOTE — Telephone Encounter (Signed)
Patients heart rate was low consistently 48-52  BP  142/62

## 2017-07-13 NOTE — Telephone Encounter (Signed)
Beth aware  - pt NTBS

## 2017-07-13 NOTE — Telephone Encounter (Signed)
Appears to be sinus brady on EKG from May  Needs to be seen for full eval  April Apple, MD [facility] 07/13/2017, 1:30 PM

## 2017-07-17 ENCOUNTER — Encounter: Payer: Self-pay | Admitting: Family Medicine

## 2017-07-17 ENCOUNTER — Ambulatory Visit (INDEPENDENT_AMBULATORY_CARE_PROVIDER_SITE_OTHER): Payer: Medicare Other | Admitting: Family Medicine

## 2017-07-17 VITALS — BP 142/61 | HR 52 | Temp 97.6°F | Ht 62.0 in | Wt 147.0 lb

## 2017-07-17 DIAGNOSIS — R001 Bradycardia, unspecified: Secondary | ICD-10-CM

## 2017-07-17 DIAGNOSIS — E119 Type 2 diabetes mellitus without complications: Secondary | ICD-10-CM | POA: Diagnosis not present

## 2017-07-17 DIAGNOSIS — E785 Hyperlipidemia, unspecified: Secondary | ICD-10-CM

## 2017-07-17 LAB — BAYER DCA HB A1C WAIVED: HB A1C (BAYER DCA - WAIVED): 6.3 % (ref <7.0)

## 2017-07-17 NOTE — Progress Notes (Signed)
br

## 2017-07-17 NOTE — Progress Notes (Signed)
   HPI  Patient presents today hear for bradycardia  Patient denies any shortness of breath, chest pain, or syncope. Her daughter-in-law is present and states that she has been sleeping normally, in her normal activity and mentating normally. The patient does not want to pursue further workup.  Type 2 diabetes, hyperlipidemia 2.minimally, not checking blood sugars.  Patient recently had AV graft placed for declining renal function.  PMH: Smoking status noted ROS: Per HPI  Objective: BP (!) 142/61   Pulse (!) 52   Temp 97.6 F (36.4 C) (Oral)   Ht '5\' 2"'$  (1.575 m)   Wt 147 lb (66.7 kg)   BMI 26.89 kg/m  Gen: NAD, alert, cooperative with exam HEENT: NCAT CV: brady Resp: CTABL, no wheezes, non-labored Ext: No edema, warm Neuro: Alert and oriented, No gross deficits  Left arm with palpable thrill just proximal to the antecubital fossa  EKG- Sinus brady, no blocks  Assessment and plan:  # bradycardia Sinus brady Asymptomatic Offered further work up including holter or referral and they decline.  Reviewed red flags for return or emergency care  # Dyslipidemia Repeat labs today No medications  # Type 2 diabetes A1c pending, previously well controlled with diet     Orders Placed This Encounter  Procedures  . LDL Cholesterol, Direct  . CBC with Differential/Platelet  . CMP14+EGFR  . Bayer DCA Hb A1c Waived  . EKG 12-Lead    Laroy Apple, MD [facility] 07/17/2017, 4:09 PM

## 2017-07-17 NOTE — Patient Instructions (Signed)
Great to see you!  Please let me know if she has any concerning symptoms like shortness of breath, chest pain, passing out or almost passing out, increasing fatigue or weakness, or other concerns

## 2017-07-18 DIAGNOSIS — E441 Mild protein-calorie malnutrition: Secondary | ICD-10-CM | POA: Diagnosis not present

## 2017-07-18 LAB — CBC WITH DIFFERENTIAL/PLATELET
BASOS ABS: 0 10*3/uL (ref 0.0–0.2)
Basos: 0 %
EOS (ABSOLUTE): 0.1 10*3/uL (ref 0.0–0.4)
Eos: 2 %
HEMOGLOBIN: 11.3 g/dL (ref 11.1–15.9)
Hematocrit: 35 % (ref 34.0–46.6)
Immature Grans (Abs): 0.1 10*3/uL (ref 0.0–0.1)
Immature Granulocytes: 1 %
LYMPHS ABS: 2.6 10*3/uL (ref 0.7–3.1)
Lymphs: 34 %
MCH: 27.6 pg (ref 26.6–33.0)
MCHC: 32.3 g/dL (ref 31.5–35.7)
MCV: 86 fL (ref 79–97)
MONOCYTES: 8 %
MONOS ABS: 0.6 10*3/uL (ref 0.1–0.9)
NEUTROS ABS: 4.3 10*3/uL (ref 1.4–7.0)
Neutrophils: 55 %
Platelets: 195 10*3/uL (ref 150–379)
RBC: 4.09 x10E6/uL (ref 3.77–5.28)
RDW: 14.8 % (ref 12.3–15.4)
WBC: 7.8 10*3/uL (ref 3.4–10.8)

## 2017-07-18 LAB — CMP14+EGFR
ALT: 8 IU/L (ref 0–32)
AST: 12 IU/L (ref 0–40)
Albumin/Globulin Ratio: 1.6 (ref 1.2–2.2)
Albumin: 3.7 g/dL (ref 3.5–4.8)
Alkaline Phosphatase: 79 IU/L (ref 39–117)
BUN/Creatinine Ratio: 12 (ref 12–28)
BUN: 35 mg/dL — ABNORMAL HIGH (ref 8–27)
Bilirubin Total: 0.3 mg/dL (ref 0.0–1.2)
CO2: 24 mmol/L (ref 20–29)
CREATININE: 2.92 mg/dL — AB (ref 0.57–1.00)
Calcium: 9.5 mg/dL (ref 8.7–10.3)
Chloride: 108 mmol/L — ABNORMAL HIGH (ref 96–106)
GFR, EST AFRICAN AMERICAN: 17 mL/min/{1.73_m2} — AB (ref >59)
GFR, EST NON AFRICAN AMERICAN: 15 mL/min/{1.73_m2} — AB (ref >59)
GLUCOSE: 114 mg/dL — AB (ref 65–99)
Globulin, Total: 2.3 g/dL (ref 1.5–4.5)
Potassium: 4.7 mmol/L (ref 3.5–5.2)
Sodium: 145 mmol/L — ABNORMAL HIGH (ref 134–144)
Total Protein: 6 g/dL (ref 6.0–8.5)

## 2017-07-18 LAB — LDL CHOLESTEROL, DIRECT: LDL DIRECT: 57 mg/dL (ref 0–99)

## 2017-07-24 ENCOUNTER — Ambulatory Visit: Payer: Medicare Other | Admitting: Family Medicine

## 2017-08-17 DIAGNOSIS — E441 Mild protein-calorie malnutrition: Secondary | ICD-10-CM | POA: Diagnosis not present

## 2017-09-10 DIAGNOSIS — N184 Chronic kidney disease, stage 4 (severe): Secondary | ICD-10-CM | POA: Diagnosis not present

## 2017-09-10 DIAGNOSIS — E119 Type 2 diabetes mellitus without complications: Secondary | ICD-10-CM | POA: Diagnosis not present

## 2017-09-10 DIAGNOSIS — Z23 Encounter for immunization: Secondary | ICD-10-CM | POA: Diagnosis not present

## 2017-09-10 DIAGNOSIS — E213 Hyperparathyroidism, unspecified: Secondary | ICD-10-CM | POA: Diagnosis not present

## 2017-09-10 DIAGNOSIS — I10 Essential (primary) hypertension: Secondary | ICD-10-CM | POA: Diagnosis not present

## 2017-09-10 DIAGNOSIS — I770 Arteriovenous fistula, acquired: Secondary | ICD-10-CM | POA: Diagnosis not present

## 2017-09-13 DIAGNOSIS — E440 Moderate protein-calorie malnutrition: Secondary | ICD-10-CM | POA: Diagnosis not present

## 2017-09-13 DIAGNOSIS — N184 Chronic kidney disease, stage 4 (severe): Secondary | ICD-10-CM | POA: Diagnosis not present

## 2017-09-13 DIAGNOSIS — M6281 Muscle weakness (generalized): Secondary | ICD-10-CM | POA: Diagnosis not present

## 2017-09-13 DIAGNOSIS — J439 Emphysema, unspecified: Secondary | ICD-10-CM | POA: Diagnosis not present

## 2017-09-13 DIAGNOSIS — E785 Hyperlipidemia, unspecified: Secondary | ICD-10-CM | POA: Diagnosis not present

## 2017-09-13 DIAGNOSIS — H9190 Unspecified hearing loss, unspecified ear: Secondary | ICD-10-CM | POA: Diagnosis not present

## 2017-09-13 DIAGNOSIS — K259 Gastric ulcer, unspecified as acute or chronic, without hemorrhage or perforation: Secondary | ICD-10-CM | POA: Diagnosis not present

## 2017-09-13 DIAGNOSIS — E1122 Type 2 diabetes mellitus with diabetic chronic kidney disease: Secondary | ICD-10-CM | POA: Diagnosis not present

## 2017-09-13 DIAGNOSIS — I129 Hypertensive chronic kidney disease with stage 1 through stage 4 chronic kidney disease, or unspecified chronic kidney disease: Secondary | ICD-10-CM | POA: Diagnosis not present

## 2017-09-17 DIAGNOSIS — E441 Mild protein-calorie malnutrition: Secondary | ICD-10-CM | POA: Diagnosis not present

## 2017-09-18 ENCOUNTER — Other Ambulatory Visit: Payer: Self-pay | Admitting: Family Medicine

## 2017-09-18 DIAGNOSIS — M810 Age-related osteoporosis without current pathological fracture: Secondary | ICD-10-CM

## 2017-10-17 DIAGNOSIS — E441 Mild protein-calorie malnutrition: Secondary | ICD-10-CM | POA: Diagnosis not present

## 2017-10-25 ENCOUNTER — Encounter: Payer: Self-pay | Admitting: *Deleted

## 2017-10-25 ENCOUNTER — Ambulatory Visit (INDEPENDENT_AMBULATORY_CARE_PROVIDER_SITE_OTHER): Payer: Medicare Other

## 2017-10-25 ENCOUNTER — Ambulatory Visit (INDEPENDENT_AMBULATORY_CARE_PROVIDER_SITE_OTHER): Payer: Medicare Other | Admitting: *Deleted

## 2017-10-25 VITALS — BP 107/57 | HR 60 | Ht 60.0 in | Wt 141.0 lb

## 2017-10-25 DIAGNOSIS — Z0000 Encounter for general adult medical examination without abnormal findings: Secondary | ICD-10-CM

## 2017-10-25 DIAGNOSIS — J439 Emphysema, unspecified: Secondary | ICD-10-CM

## 2017-10-25 DIAGNOSIS — K259 Gastric ulcer, unspecified as acute or chronic, without hemorrhage or perforation: Secondary | ICD-10-CM

## 2017-10-25 DIAGNOSIS — E1122 Type 2 diabetes mellitus with diabetic chronic kidney disease: Secondary | ICD-10-CM

## 2017-10-25 DIAGNOSIS — I129 Hypertensive chronic kidney disease with stage 1 through stage 4 chronic kidney disease, or unspecified chronic kidney disease: Secondary | ICD-10-CM | POA: Diagnosis not present

## 2017-10-25 NOTE — Patient Instructions (Signed)
  April [name] , Thank you for taking time to come for your Medicare Wellness Visit. I appreciate your ongoing commitment to your health goals. Please review the following plan we discussed and let me know if I can assist you in the future.   These are the goals we discussed: Chair exercises for 10 minutes daily (see handout)  This is a list of the screening recommended for you and due dates:  Health Maintenance  Topic Date Due  . Eye exam for diabetics  10/04/2016  . Tetanus Vaccine  10/25/2018*  . Complete foot exam   12/18/2017  . Hemoglobin A1C  01/14/2018  . Urine Protein Check  03/20/2018  . Flu Shot  Completed  . DEXA scan (bone density measurement)  Completed  . Pneumonia vaccines  Completed  *Topic was postponed. The date shown is not the original due date.

## 2017-10-25 NOTE — Progress Notes (Signed)
Subjective:   April [name] is a 82 y.o. female who presents for an Initial Medicare Annual Wellness Visit. Ms Pester is accompanied today by her caregiver whom she lives with. She is in a wheelchair today but is able ambulate short distances with assistance. Patient is very hard of hearing and some of her history is provided by her caregiver.   Review of Systems    Health is about the same as last year.   Cardiac Risk Factors include: advanced age (>25men, >32 women);obesity (BMI >30kg/m2);sedentary lifestyle;hypertension;diabetes mellitus  Other systems negative today.     Objective:    Today's Vitals   10/25/17 1441  BP: (!) 107/57  Pulse: 60  Weight: 141 lb (64 kg)  Height: 5' (1.524 m)   Body mass index is 27.54 kg/m.  Advanced Directives 10/25/2017 05/18/2017 03/09/2017 07/21/2016 06/10/2012  Does Patient Have a Medical Advance Directive? Yes No No No Patient has advance directive, copy not in chart  Type of Advance Directive [place]  Does patient want to make changes to medical advance directive? No - Patient declined - - - -  Copy of [place] in Chart? No - copy requested - - - -  Would patient like information on creating a medical advance directive? - No - Patient declined - - -  Pre-existing out of facility DNR order (yellow form or pink MOST form) - - - - No    Current Medications (verified) Outpatient Encounter Medications as of 10/25/2017  Medication Sig  . alendronate (FOSAMAX) 70 MG tablet TAKE 1 TABLET BY MOUTH EVERY 7 DAYS WITH A FULL GLASS OF WATER OR ON AN EMPTY STOMACH  . atorvastatin (LIPITOR) 20 MG tablet TAKE 1 TABLET (20 MG TOTAL) BY MOUTH DAILY. FOR HYPERLIPIDEMIA  . Calcium Carbonate (CALCIUM 600 PO) Take 600 mg by mouth daily.  . cholecalciferol (VITAMIN D) 1000 UNITS tablet Take 2 tablets (2,000 Units total) by mouth daily. For Vit D deficiency (Patient taking differently: Take  1,000 Units by mouth daily. For Vit D deficiency)  . cinacalcet (SENSIPAR) 60 MG tablet Take 120 mg by mouth daily.  . febuxostat (ULORIC) 40 MG tablet Take 1 tablet (40 mg total) by mouth daily. For gout  . feeding supplement, GLUCERNA SHAKE, (GLUCERNA SHAKE) LIQD Take 237 mLs by mouth 2 (two) times daily between meals.  . pantoprazole (PROTONIX) 40 MG tablet TAKE 1 TABLET (40 MG TOTAL) BY MOUTH DAILY.  Vladimir Faster Glycol-Propyl Glycol (SYSTANE OP) Place 1 drop into both eyes daily.  . sennosides-docusate sodium (SENOKOT-S) 8.6-50 MG tablet Take 1 tablet by mouth daily.   No facility-administered encounter medications on file as of 10/25/2017.     Allergies (verified) Nsaids   History: Past Medical History:  Diagnosis Date  . Chronic kidney disease   . COPD (chronic obstructive pulmonary disease) ([place])   . Diabetes mellitus   . Hypertension   . Hyperthyroidism   . Insomnia   . Perforated ulcer ([place])    Past Surgical History:  Procedure Laterality Date  . AV FISTULA PLACEMENT Left 04/03/2017   Procedure: LEFT BRACHIOCEPHALIC ARTERIOVENOUS (AV)  FISTULA CREATION;  Surgeon: Waynetta Sandy, MD;  Location: [facility];  Service: Vascular;  Laterality: Left;  . LAPAROTOMY  06/10/2012   Procedure: Omental patch of perforated ulcer, EXPLORATORY LAPAROTOMY;  Surgeon: Odis Hollingshead, MD;  Location: [facility];  Service: General;  Laterality: N/A;  closure of perforated  gastric ulcer  . TUMOR REMOVAL     of her sinus cavity   Family History  Problem Relation Age of Onset  . Cancer Daughter        metastatic breast cancer  . Diabetes Daughter   . Cancer Daughter    Social History   Socioeconomic History  . Marital status: Divorced    Spouse name: Not on file  . Number of children: 6  . Years of education: 59  . Highest education level: 10th grade  Social Needs  . Financial resource strain: Not hard at all  . Food insecurity - worry: Never true  . Food insecurity - inability:  Never true  . Transportation needs - medical: No  . Transportation needs - non-medical: No  Occupational History  . Occupation: retired    Comment: cook/waitress  Tobacco Use  . Smoking status: Former Smoker    Packs/day: 1.00    Years: 52.00    Pack years: 52.00    Types: Cigarettes    Last attempt to quit: 06/14/2012    Years since quitting: 5.3  . Smokeless tobacco: Never Used  Substance and Sexual Activity  . Alcohol use: No  . Drug use: No  . Sexual activity: No  Other Topics Concern  . Not on file  Social History Narrative  . Not on file    Tobacco Counseling No tobacco use  Clinical Intake:    Pain : No/denies pain    Diabetes: No  How often do you need to have someone help you when you read instructions, pamphlets, or other written materials from your doctor or pharmacy?: 2 - Rarely(due to eyesight not comprehension) What is the last grade level you completed in school?: 10th grade  Interpreter Needed?: No  Information entered by :: Chong Sicilian, RN   Activities of Daily Living In your present state of health, do you have any difficulty performing the following activities: 10/25/2017 04/03/2017  Hearing? Tempie Donning  Comment very hard of hearing -  Vision? Y Y  Comment glaucoma. Eye exam is overdue. Would like a referral to a different doctor than she has seen in the past.  -  Difficulty concentrating or making decisions? N N  Walking or climbing stairs? Tempie Donning  Comment One level home. 4 steps into home with railings.  -  Dressing or bathing? Y Y  Doing errands, shopping? Y -  Conservation officer, nature and eating ? Y -  Comment Caregiver helps with preparing food but patient can feed herself -  Using the Toilet? N -  In the past six months, have you accidently leaked urine? N -  Do you have problems with loss of bowel control? N -  Managing your Medications? Y -  Comment caregiver keeps up with medications and places them in a pill box -  Managing your Finances? Y -    Comment son takes care of finances -  Housekeeping or managing your Housekeeping? Y -  Comment son and caregiver help with housekeeping -  Some recent data might be hidden     Immunizations and Health Maintenance Immunization History  Administered Date(s) Administered  . Influenza Whole 09/08/2010, 09/23/2012, 08/18/2013  . Influenza, High Dose Seasonal PF 08/16/2016  . Influenza,inj,Quad PF,6+ Mos 09/10/2015  . Influenza-Unspecified 08/17/2014, 09/10/2017  . Pneumococcal Polysaccharide-23 05/11/2005, 09/22/2008  . Td 05/11/2005   Health Maintenance Due  Topic Date Due  . OPHTHALMOLOGY EXAM  10/04/2016    Patient Care Team: Timmothy Euler,  MD as PCP - General (Family Medicine) Estanislado Emms, MD as Consulting Physician (Nephrology) Gerlene Fee, NP as Nurse Practitioner (Nurse Practitioner)  No hospitalizations, ER visits, or surgeries this past year.   Assessment:   This is a routine wellness examination for [place].  Hearing/Vision screen Very hard of hearing. Eye exam is overdue.   Dietary issues and exercise activities discussed: Current Exercise Habits: The patient does not participate in regular exercise at present, Exercise limited by: orthopedic condition(s)  Goals Chair exercises for 10 minutes a day  Depression Screen PHQ 2/9 Scores 07/17/2017 03/20/2017 12/18/2016 11/03/2016 08/16/2016 04/13/2016 12/14/2015  PHQ - 2 Score 0 0 2 0 0 0 0  PHQ- 9 Score - - 5 - - - -    Fall Risk Fall Risk  07/17/2017 03/20/2017 12/18/2016 11/03/2016 08/16/2016  Falls in the past year? No No No No No    Cognitive Function:  Unable to complete due to extreme difficulty hearing     Screening Tests Health Maintenance  Topic Date Due  . OPHTHALMOLOGY EXAM  10/04/2016  . TETANUS/TDAP  10/25/2018 (Originally 05/12/2015)  . FOOT EXAM  12/18/2017  . HEMOGLOBIN A1C  01/14/2018  . URINE MICROALBUMIN  03/20/2018  . INFLUENZA VACCINE  Completed  . DEXA SCAN  Completed  . PNA  vac Low Risk Adult  Completed      Plan:  Chair exercises daily for 10 min. Handout given and reviewed.  Move carefully to avoid falls Referral to ophthalmologist placed.  Keep f/u with PCP  I have personally reviewed and noted the following in the patient's chart:   . Medical and social history . Use of alcohol, tobacco or illicit drugs  . Current medications and supplements . Functional ability and status . Nutritional status . Physical activity . Advanced directives . List of other physicians . Hospitalizations, surgeries, and ER visits in previous 12 months . Vitals . Screenings to include cognitive, depression, and falls . Referrals and appointments  In addition, I have reviewed and discussed with patient certain preventive protocols, quality metrics, and best practice recommendations. A written personalized care plan for preventive services as well as general preventive health recommendations were provided to patient.     Chong Sicilian, RN   10/25/2017    I have reviewed and agree with the above AWV documentation.   Laroy Apple, MD [facility] 10/25/2017, 7:01 PM

## 2017-11-12 DIAGNOSIS — K259 Gastric ulcer, unspecified as acute or chronic, without hemorrhage or perforation: Secondary | ICD-10-CM | POA: Diagnosis not present

## 2017-11-12 DIAGNOSIS — J439 Emphysema, unspecified: Secondary | ICD-10-CM | POA: Diagnosis not present

## 2017-11-12 DIAGNOSIS — H9190 Unspecified hearing loss, unspecified ear: Secondary | ICD-10-CM | POA: Diagnosis not present

## 2017-11-12 DIAGNOSIS — E1122 Type 2 diabetes mellitus with diabetic chronic kidney disease: Secondary | ICD-10-CM | POA: Diagnosis not present

## 2017-11-12 DIAGNOSIS — N184 Chronic kidney disease, stage 4 (severe): Secondary | ICD-10-CM | POA: Diagnosis not present

## 2017-11-12 DIAGNOSIS — E785 Hyperlipidemia, unspecified: Secondary | ICD-10-CM | POA: Diagnosis not present

## 2017-11-12 DIAGNOSIS — E440 Moderate protein-calorie malnutrition: Secondary | ICD-10-CM | POA: Diagnosis not present

## 2017-11-12 DIAGNOSIS — I129 Hypertensive chronic kidney disease with stage 1 through stage 4 chronic kidney disease, or unspecified chronic kidney disease: Secondary | ICD-10-CM | POA: Diagnosis not present

## 2017-11-12 DIAGNOSIS — M6281 Muscle weakness (generalized): Secondary | ICD-10-CM | POA: Diagnosis not present

## 2017-11-16 ENCOUNTER — Ambulatory Visit: Payer: Medicare Other | Admitting: Family Medicine

## 2017-11-17 DIAGNOSIS — E441 Mild protein-calorie malnutrition: Secondary | ICD-10-CM | POA: Diagnosis not present

## 2017-11-19 ENCOUNTER — Ambulatory Visit: Payer: Medicare Other | Admitting: Family Medicine

## 2017-12-03 ENCOUNTER — Ambulatory Visit: Payer: Medicare Other | Admitting: Family Medicine

## 2017-12-11 ENCOUNTER — Other Ambulatory Visit: Payer: Self-pay | Admitting: Family Medicine

## 2017-12-11 ENCOUNTER — Encounter: Payer: Self-pay | Admitting: Family Medicine

## 2017-12-11 ENCOUNTER — Ambulatory Visit (INDEPENDENT_AMBULATORY_CARE_PROVIDER_SITE_OTHER): Payer: Medicare Other | Admitting: Family Medicine

## 2017-12-11 VITALS — BP 128/68 | HR 53 | Temp 97.4°F | Ht 60.0 in | Wt 142.0 lb

## 2017-12-11 DIAGNOSIS — E119 Type 2 diabetes mellitus without complications: Secondary | ICD-10-CM

## 2017-12-11 DIAGNOSIS — M810 Age-related osteoporosis without current pathological fracture: Secondary | ICD-10-CM

## 2017-12-11 LAB — BAYER DCA HB A1C WAIVED: HB A1C (BAYER DCA - WAIVED): 6.1 % (ref <7.0)

## 2017-12-11 NOTE — Progress Notes (Signed)
   HPI  Patient presents today here for follow-up chronic medical conditions.  Patient has stage IV-V CKD and approaching hemodialysis, they are concerned that her fistula is not well functioning.  They state that they have discussed this with her nephrologist has recommended some exercises.  Type 2 diabetes Care diet, no medications.  PMH: Smoking status noted ROS: Per HPI  Objective: BP 128/68   Pulse (!) 53   Temp (!) 97.4 F (36.3 C) (Oral)   Ht 5' (1.524 m)   Wt 142 lb (64.4 kg)   BMI 27.73 kg/m  Gen: NAD, alert, cooperative with exam HEENT: NCAT, EOMI, PERRL CV: RRR, good S1/S2, no murmur Resp: CTABL, no wheezes, non-labored Ext: No edema, warm Neuro: Alert and oriented, No gross deficits  Diabetic Foot Exam - Simple   Simple Foot Form Diabetic Foot exam was performed with the following findings:  Yes 12/11/2017  2:12 PM  Visual Inspection No deformities, no ulcerations, no other skin breakdown bilaterally:  Yes Sensation Testing Intact to touch and monofilament testing bilaterally:  Yes Pulse Check Posterior Tibialis and Dorsalis pulse intact bilaterally:  Yes Comments      Assessment and plan:  # T2DM Well controlled with diet only RTC in 3-4 months    Orders Placed This Encounter  Procedures  . [facility] Hb A1c Carney Bern, MD [facility] 12/11/2017, 2:14 PM

## 2017-12-11 NOTE — Patient Instructions (Signed)
Great to see you!  Your Diabetes is very well controlled, keep up the good work!

## 2017-12-18 DIAGNOSIS — E441 Mild protein-calorie malnutrition: Secondary | ICD-10-CM | POA: Diagnosis not present

## 2017-12-27 ENCOUNTER — Other Ambulatory Visit: Payer: Self-pay | Admitting: *Deleted

## 2017-12-27 MED ORDER — ATORVASTATIN CALCIUM 20 MG PO TABS: 20.0000 mg | ORAL_TABLET | Freq: Every day | ORAL | 0 refills | Status: DC

## 2017-12-27 MED ORDER — PANTOPRAZOLE SODIUM 40 MG PO TBEC: 40.0000 mg | DELAYED_RELEASE_TABLET | Freq: Every day | ORAL | 0 refills | Status: DC

## 2017-12-27 NOTE — Addendum Note (Signed)
Addended by: Antonietta Barcelona D on: 12/27/2017 10:00 AM   Modules accepted: Orders

## 2018-01-08 DIAGNOSIS — J439 Emphysema, unspecified: Secondary | ICD-10-CM | POA: Diagnosis not present

## 2018-01-08 DIAGNOSIS — I129 Hypertensive chronic kidney disease with stage 1 through stage 4 chronic kidney disease, or unspecified chronic kidney disease: Secondary | ICD-10-CM | POA: Diagnosis not present

## 2018-01-08 DIAGNOSIS — E1122 Type 2 diabetes mellitus with diabetic chronic kidney disease: Secondary | ICD-10-CM | POA: Diagnosis not present

## 2018-01-08 DIAGNOSIS — E785 Hyperlipidemia, unspecified: Secondary | ICD-10-CM | POA: Diagnosis not present

## 2018-01-08 DIAGNOSIS — K259 Gastric ulcer, unspecified as acute or chronic, without hemorrhage or perforation: Secondary | ICD-10-CM | POA: Diagnosis not present

## 2018-01-08 DIAGNOSIS — H9190 Unspecified hearing loss, unspecified ear: Secondary | ICD-10-CM | POA: Diagnosis not present

## 2018-01-08 DIAGNOSIS — M6281 Muscle weakness (generalized): Secondary | ICD-10-CM | POA: Diagnosis not present

## 2018-01-08 DIAGNOSIS — N184 Chronic kidney disease, stage 4 (severe): Secondary | ICD-10-CM | POA: Diagnosis not present

## 2018-01-08 DIAGNOSIS — E440 Moderate protein-calorie malnutrition: Secondary | ICD-10-CM | POA: Diagnosis not present

## 2018-01-14 ENCOUNTER — Other Ambulatory Visit: Payer: Self-pay | Admitting: *Deleted

## 2018-01-14 ENCOUNTER — Ambulatory Visit: Payer: Medicare Other | Admitting: *Deleted

## 2018-01-14 MED ORDER — FEBUXOSTAT 40 MG PO TABS: 40.0000 mg | ORAL_TABLET | Freq: Every day | ORAL | 0 refills | Status: DC

## 2018-01-15 DIAGNOSIS — E441 Mild protein-calorie malnutrition: Secondary | ICD-10-CM | POA: Diagnosis not present

## 2018-01-29 ENCOUNTER — Ambulatory Visit (INDEPENDENT_AMBULATORY_CARE_PROVIDER_SITE_OTHER): Payer: Medicare Other

## 2018-01-29 DIAGNOSIS — J439 Emphysema, unspecified: Secondary | ICD-10-CM | POA: Diagnosis not present

## 2018-01-29 DIAGNOSIS — I129 Hypertensive chronic kidney disease with stage 1 through stage 4 chronic kidney disease, or unspecified chronic kidney disease: Secondary | ICD-10-CM | POA: Diagnosis not present

## 2018-01-29 DIAGNOSIS — E1122 Type 2 diabetes mellitus with diabetic chronic kidney disease: Secondary | ICD-10-CM

## 2018-01-29 DIAGNOSIS — K259 Gastric ulcer, unspecified as acute or chronic, without hemorrhage or perforation: Secondary | ICD-10-CM

## 2018-02-15 DIAGNOSIS — E441 Mild protein-calorie malnutrition: Secondary | ICD-10-CM | POA: Diagnosis not present

## 2018-02-20 DIAGNOSIS — I770 Arteriovenous fistula, acquired: Secondary | ICD-10-CM | POA: Diagnosis not present

## 2018-02-20 DIAGNOSIS — I129 Hypertensive chronic kidney disease with stage 1 through stage 4 chronic kidney disease, or unspecified chronic kidney disease: Secondary | ICD-10-CM | POA: Diagnosis not present

## 2018-02-20 DIAGNOSIS — E213 Hyperparathyroidism, unspecified: Secondary | ICD-10-CM | POA: Diagnosis not present

## 2018-02-20 DIAGNOSIS — E1122 Type 2 diabetes mellitus with diabetic chronic kidney disease: Secondary | ICD-10-CM | POA: Diagnosis not present

## 2018-02-20 DIAGNOSIS — N184 Chronic kidney disease, stage 4 (severe): Secondary | ICD-10-CM | POA: Diagnosis not present

## 2018-03-02 ENCOUNTER — Other Ambulatory Visit: Payer: Self-pay | Admitting: Family Medicine

## 2018-03-02 DIAGNOSIS — M810 Age-related osteoporosis without current pathological fracture: Secondary | ICD-10-CM

## 2018-03-11 DIAGNOSIS — H9190 Unspecified hearing loss, unspecified ear: Secondary | ICD-10-CM | POA: Diagnosis not present

## 2018-03-11 DIAGNOSIS — M6281 Muscle weakness (generalized): Secondary | ICD-10-CM | POA: Diagnosis not present

## 2018-03-11 DIAGNOSIS — E440 Moderate protein-calorie malnutrition: Secondary | ICD-10-CM | POA: Diagnosis not present

## 2018-03-11 DIAGNOSIS — K259 Gastric ulcer, unspecified as acute or chronic, without hemorrhage or perforation: Secondary | ICD-10-CM | POA: Diagnosis not present

## 2018-03-11 DIAGNOSIS — E785 Hyperlipidemia, unspecified: Secondary | ICD-10-CM | POA: Diagnosis not present

## 2018-03-11 DIAGNOSIS — N184 Chronic kidney disease, stage 4 (severe): Secondary | ICD-10-CM | POA: Diagnosis not present

## 2018-03-11 DIAGNOSIS — J439 Emphysema, unspecified: Secondary | ICD-10-CM | POA: Diagnosis not present

## 2018-03-11 DIAGNOSIS — E1122 Type 2 diabetes mellitus with diabetic chronic kidney disease: Secondary | ICD-10-CM | POA: Diagnosis not present

## 2018-03-11 DIAGNOSIS — I129 Hypertensive chronic kidney disease with stage 1 through stage 4 chronic kidney disease, or unspecified chronic kidney disease: Secondary | ICD-10-CM | POA: Diagnosis not present

## 2018-03-17 DIAGNOSIS — E441 Mild protein-calorie malnutrition: Secondary | ICD-10-CM | POA: Diagnosis not present

## 2018-03-26 ENCOUNTER — Other Ambulatory Visit: Payer: Self-pay | Admitting: Family Medicine

## 2018-04-11 ENCOUNTER — Ambulatory Visit: Payer: Medicare Other | Admitting: Family Medicine

## 2018-04-16 ENCOUNTER — Encounter: Payer: Self-pay | Admitting: Family Medicine

## 2018-04-16 ENCOUNTER — Ambulatory Visit (INDEPENDENT_AMBULATORY_CARE_PROVIDER_SITE_OTHER): Payer: Medicare Other

## 2018-04-16 ENCOUNTER — Ambulatory Visit (INDEPENDENT_AMBULATORY_CARE_PROVIDER_SITE_OTHER): Payer: Medicare Other | Admitting: Family Medicine

## 2018-04-16 VITALS — BP 118/66 | HR 61 | Temp 97.0°F

## 2018-04-16 DIAGNOSIS — M79644 Pain in right finger(s): Secondary | ICD-10-CM

## 2018-04-16 DIAGNOSIS — M79641 Pain in right hand: Secondary | ICD-10-CM | POA: Diagnosis not present

## 2018-04-16 DIAGNOSIS — E119 Type 2 diabetes mellitus without complications: Secondary | ICD-10-CM

## 2018-04-16 DIAGNOSIS — M810 Age-related osteoporosis without current pathological fracture: Secondary | ICD-10-CM

## 2018-04-16 LAB — BAYER DCA HB A1C WAIVED: HB A1C (BAYER DCA - WAIVED): 5.9 % (ref <7.0)

## 2018-04-16 MED ORDER — SENNA-DOCUSATE SODIUM 8.6-50 MG PO TABS: 1.0000 | ORAL_TABLET | Freq: Every day | ORAL | 3 refills | Status: AC

## 2018-04-16 MED ORDER — FEBUXOSTAT 40 MG PO TABS: 40.0000 mg | ORAL_TABLET | Freq: Every day | ORAL | 3 refills | Status: AC

## 2018-04-16 MED ORDER — ALENDRONATE SODIUM 70 MG PO TABS: ORAL_TABLET | ORAL | 3 refills | Status: AC

## 2018-04-16 MED ORDER — ATORVASTATIN CALCIUM 20 MG PO TABS: 20.0000 mg | ORAL_TABLET | Freq: Every day | ORAL | 3 refills | Status: AC

## 2018-04-16 MED ORDER — CINACALCET HCL 60 MG PO TABS: 60.0000 mg | ORAL_TABLET | Freq: Three times a day (TID) | ORAL | 3 refills | Status: AC

## 2018-04-16 MED ORDER — PANTOPRAZOLE SODIUM 40 MG PO TBEC: 40.0000 mg | DELAYED_RELEASE_TABLET | Freq: Every day | ORAL | 3 refills | Status: AC

## 2018-04-16 NOTE — Progress Notes (Signed)
   HPI  Patient presents today here for follow-up chronic medical conditions and right hand pain.  Patient began having right fourth digit pain without good cause about a week ago. She continues to protect the hand and wince whenever it straightened out her palpated.  She denies any injury.  Type 2 diabetes Watching diet, no medications.  Osteoporosis Needs refill of Fosamax, tolerating well  PMH: Smoking status noted ROS: Per HPI  Objective: BP 118/66   Pulse 61   Temp (!) 97 F (36.1 C) (Oral)  Gen: NAD, alert, cooperative with exam HEENT: NCAT CV: RRR, good S1/S2, no murmur Resp: CTABL, no wheezes, non-labored Ext: No edema, warm Neuro: Alert and oriented, No gross deficits MSK:  TTP of R 4th MCP and prox phalange, Pan with extension R 3-5th fingers with ulnar deviation  Assessment and plan:  #Type 2 diabetes Well-controlled with only diet, A1c 5.9 Follow-up 3 months  #Right fourth finger pain Unclear etiology, possible fracture, possibly gout flare Plain film pending Patient also has some ulnar deviation of the right hand  #Osteoporosis Continue Fosamax, refilled It looks like it has been a while since she has had a bone density scan, consider at  Next visit.     Orders Placed This Encounter  Procedures  . DG Hand Complete Right    Standing Status:   Future    Standing Expiration Date:   06/17/2019    Order Specific Question:   Reason for Exam (SYMPTOM  OR DIAGNOSIS REQUIRED)    Answer:   R 4th MCP proximal phalange pain    Order Specific Question:   Preferred imaging location?    Answer:   Internal    Order Specific Question:   Radiology Contrast Protocol - do NOT remove file path    Answer:   [email or web address]  . Bayer DCA Hb A1c Waived  . CMP14+EGFR    Meds ordered this encounter  Medications  . alendronate (FOSAMAX) 70 MG tablet    Sig: TAKE 1 TABLET BY MOUTH EVERY 7 DAYS WITH A FULL GLASS OF WATER OR  ON AN EMPTY STOMACH    Dispense:  12 tablet    Refill:  3  . atorvastatin (LIPITOR) 20 MG tablet    Sig: Take 1 tablet (20 mg total) by mouth daily. For hyperlipidemia    Dispense:  90 tablet    Refill:  3  . cinacalcet (SENSIPAR) 60 MG tablet    Sig: Take 1 tablet (60 mg total) by mouth 3 (three) times daily.    Dispense:  90 tablet    Refill:  3  . febuxostat (ULORIC) 40 MG tablet    Sig: Take 1 tablet (40 mg total) by mouth daily. For gout    Dispense:  90 tablet    Refill:  3  . pantoprazole (PROTONIX) 40 MG tablet    Sig: Take 1 tablet (40 mg total) by mouth daily.    Dispense:  90 tablet    Refill:  3  . sennosides-docusate sodium (SENOKOT-S) 8.6-50 MG tablet    Sig: Take 1 tablet by mouth daily.    Dispense:  90 tablet    Refill:  3    Sam Bradshaw, MD [facility] 04/16/2018, 1:39 PM     

## 2018-04-16 NOTE — Patient Instructions (Signed)
Great to see you!  Come back to see Dr. Gottschalk in 3 months 

## 2018-04-17 DIAGNOSIS — E441 Mild protein-calorie malnutrition: Secondary | ICD-10-CM | POA: Diagnosis not present

## 2018-04-17 LAB — CMP14+EGFR
A/G RATIO: 1.8 (ref 1.2–2.2)
ALT: 8 IU/L (ref 0–32)
AST: 12 IU/L (ref 0–40)
Albumin: 3.7 g/dL (ref 3.5–4.7)
Alkaline Phosphatase: 81 IU/L (ref 39–117)
BILIRUBIN TOTAL: 0.3 mg/dL (ref 0.0–1.2)
BUN/Creatinine Ratio: 12 (ref 12–28)
BUN: 34 mg/dL — ABNORMAL HIGH (ref 8–27)
CO2: 22 mmol/L (ref 20–29)
Calcium: 9.2 mg/dL (ref 8.7–10.3)
Chloride: 110 mmol/L — ABNORMAL HIGH (ref 96–106)
Creatinine, Ser: 2.73 mg/dL — ABNORMAL HIGH (ref 0.57–1.00)
GFR calc Af Amer: 18 mL/min/{1.73_m2} — ABNORMAL LOW (ref >59)
GFR calc non Af Amer: 16 mL/min/{1.73_m2} — ABNORMAL LOW (ref >59)
Globulin, Total: 2.1 g/dL (ref 1.5–4.5)
Glucose: 145 mg/dL — ABNORMAL HIGH (ref 65–99)
POTASSIUM: 4.2 mmol/L (ref 3.5–5.2)
Sodium: 147 mmol/L — ABNORMAL HIGH (ref 134–144)
Total Protein: 5.8 g/dL — ABNORMAL LOW (ref 6.0–8.5)

## 2018-04-19 ENCOUNTER — Ambulatory Visit: Payer: Medicare Other | Admitting: Family Medicine

## 2018-05-10 DIAGNOSIS — K259 Gastric ulcer, unspecified as acute or chronic, without hemorrhage or perforation: Secondary | ICD-10-CM | POA: Diagnosis not present

## 2018-05-10 DIAGNOSIS — I129 Hypertensive chronic kidney disease with stage 1 through stage 4 chronic kidney disease, or unspecified chronic kidney disease: Secondary | ICD-10-CM | POA: Diagnosis not present

## 2018-05-10 DIAGNOSIS — H9190 Unspecified hearing loss, unspecified ear: Secondary | ICD-10-CM | POA: Diagnosis not present

## 2018-05-10 DIAGNOSIS — J439 Emphysema, unspecified: Secondary | ICD-10-CM | POA: Diagnosis not present

## 2018-05-10 DIAGNOSIS — E440 Moderate protein-calorie malnutrition: Secondary | ICD-10-CM | POA: Diagnosis not present

## 2018-05-10 DIAGNOSIS — N184 Chronic kidney disease, stage 4 (severe): Secondary | ICD-10-CM | POA: Diagnosis not present

## 2018-05-10 DIAGNOSIS — M6281 Muscle weakness (generalized): Secondary | ICD-10-CM | POA: Diagnosis not present

## 2018-05-10 DIAGNOSIS — E785 Hyperlipidemia, unspecified: Secondary | ICD-10-CM | POA: Diagnosis not present

## 2018-05-10 DIAGNOSIS — E1122 Type 2 diabetes mellitus with diabetic chronic kidney disease: Secondary | ICD-10-CM | POA: Diagnosis not present

## 2018-05-13 ENCOUNTER — Telehealth: Payer: Self-pay | Admitting: *Deleted

## 2018-05-13 NOTE — Telephone Encounter (Signed)
VM received April [name] nurse was out recert pt for CAPS program 2 issues Dr. Florene Glen nephrologist increased her Sensipar to 180 mg two - three mos ago, it was 120 mgs she has run out and needs new Rx A month ago she began having right ring finger pain, she continues with this, took tylenol but has N&V, does she need to be seen again.

## 2018-05-13 NOTE — Telephone Encounter (Signed)
Pt aware of recommendations Not wanting to make appt at this time, will discuss with pt's son, she maybe going back into a nursing home with things that are going on.

## 2018-05-13 NOTE — Telephone Encounter (Signed)
LMOVM needs to contact Dr. Florene Glen for Sensipar Rx Pt should be seen for her finger, last OV notes state possible fracture or gout flare-up will call family as well

## 2018-05-17 DIAGNOSIS — E441 Mild protein-calorie malnutrition: Secondary | ICD-10-CM | POA: Diagnosis not present

## 2018-05-20 ENCOUNTER — Ambulatory Visit (INDEPENDENT_AMBULATORY_CARE_PROVIDER_SITE_OTHER): Payer: Medicare Other

## 2018-05-20 DIAGNOSIS — I129 Hypertensive chronic kidney disease with stage 1 through stage 4 chronic kidney disease, or unspecified chronic kidney disease: Secondary | ICD-10-CM | POA: Diagnosis not present

## 2018-05-20 DIAGNOSIS — E785 Hyperlipidemia, unspecified: Secondary | ICD-10-CM | POA: Diagnosis not present

## 2018-05-20 DIAGNOSIS — J439 Emphysema, unspecified: Secondary | ICD-10-CM | POA: Diagnosis not present

## 2018-05-20 DIAGNOSIS — H9190 Unspecified hearing loss, unspecified ear: Secondary | ICD-10-CM

## 2018-05-20 DIAGNOSIS — K259 Gastric ulcer, unspecified as acute or chronic, without hemorrhage or perforation: Secondary | ICD-10-CM | POA: Diagnosis not present

## 2018-05-20 DIAGNOSIS — E1122 Type 2 diabetes mellitus with diabetic chronic kidney disease: Secondary | ICD-10-CM | POA: Diagnosis not present

## 2018-05-20 DIAGNOSIS — M6281 Muscle weakness (generalized): Secondary | ICD-10-CM | POA: Diagnosis not present

## 2018-05-20 DIAGNOSIS — N184 Chronic kidney disease, stage 4 (severe): Secondary | ICD-10-CM | POA: Diagnosis not present

## 2018-05-20 DIAGNOSIS — E440 Moderate protein-calorie malnutrition: Secondary | ICD-10-CM | POA: Diagnosis not present

## 2018-05-23 DIAGNOSIS — R296 Repeated falls: Secondary | ICD-10-CM | POA: Diagnosis not present

## 2018-05-23 DIAGNOSIS — M24541 Contracture, right hand: Secondary | ICD-10-CM | POA: Diagnosis not present

## 2018-05-23 DIAGNOSIS — M109 Gout, unspecified: Secondary | ICD-10-CM | POA: Diagnosis not present

## 2018-05-23 DIAGNOSIS — J439 Emphysema, unspecified: Secondary | ICD-10-CM | POA: Diagnosis not present

## 2018-05-23 DIAGNOSIS — M810 Age-related osteoporosis without current pathological fracture: Secondary | ICD-10-CM | POA: Diagnosis not present

## 2018-05-23 DIAGNOSIS — R2681 Unsteadiness on feet: Secondary | ICD-10-CM | POA: Diagnosis not present

## 2018-05-23 DIAGNOSIS — Z741 Need for assistance with personal care: Secondary | ICD-10-CM | POA: Diagnosis not present

## 2018-05-23 DIAGNOSIS — M79641 Pain in right hand: Secondary | ICD-10-CM | POA: Diagnosis not present

## 2018-05-24 DIAGNOSIS — M79641 Pain in right hand: Secondary | ICD-10-CM | POA: Diagnosis not present

## 2018-05-24 DIAGNOSIS — R2681 Unsteadiness on feet: Secondary | ICD-10-CM | POA: Diagnosis not present

## 2018-05-24 DIAGNOSIS — R296 Repeated falls: Secondary | ICD-10-CM | POA: Diagnosis not present

## 2018-05-24 DIAGNOSIS — Z741 Need for assistance with personal care: Secondary | ICD-10-CM | POA: Diagnosis not present

## 2018-05-24 DIAGNOSIS — M810 Age-related osteoporosis without current pathological fracture: Secondary | ICD-10-CM | POA: Diagnosis not present

## 2018-05-24 DIAGNOSIS — J439 Emphysema, unspecified: Secondary | ICD-10-CM | POA: Diagnosis not present

## 2018-05-24 DIAGNOSIS — M109 Gout, unspecified: Secondary | ICD-10-CM | POA: Diagnosis not present

## 2018-05-24 DIAGNOSIS — M24541 Contracture, right hand: Secondary | ICD-10-CM | POA: Diagnosis not present

## 2018-05-26 DIAGNOSIS — M109 Gout, unspecified: Secondary | ICD-10-CM | POA: Diagnosis not present

## 2018-05-26 DIAGNOSIS — M810 Age-related osteoporosis without current pathological fracture: Secondary | ICD-10-CM | POA: Diagnosis not present

## 2018-05-26 DIAGNOSIS — J439 Emphysema, unspecified: Secondary | ICD-10-CM | POA: Diagnosis not present

## 2018-05-26 DIAGNOSIS — R296 Repeated falls: Secondary | ICD-10-CM | POA: Diagnosis not present

## 2018-05-26 DIAGNOSIS — Z741 Need for assistance with personal care: Secondary | ICD-10-CM | POA: Diagnosis not present

## 2018-05-26 DIAGNOSIS — R2681 Unsteadiness on feet: Secondary | ICD-10-CM | POA: Diagnosis not present

## 2018-05-26 DIAGNOSIS — M79641 Pain in right hand: Secondary | ICD-10-CM | POA: Diagnosis not present

## 2018-05-26 DIAGNOSIS — M24541 Contracture, right hand: Secondary | ICD-10-CM | POA: Diagnosis not present

## 2018-05-27 DIAGNOSIS — M24541 Contracture, right hand: Secondary | ICD-10-CM | POA: Diagnosis not present

## 2018-05-27 DIAGNOSIS — J439 Emphysema, unspecified: Secondary | ICD-10-CM | POA: Diagnosis not present

## 2018-05-27 DIAGNOSIS — Z741 Need for assistance with personal care: Secondary | ICD-10-CM | POA: Diagnosis not present

## 2018-05-27 DIAGNOSIS — M109 Gout, unspecified: Secondary | ICD-10-CM | POA: Diagnosis not present

## 2018-05-27 DIAGNOSIS — R296 Repeated falls: Secondary | ICD-10-CM | POA: Diagnosis not present

## 2018-05-27 DIAGNOSIS — M810 Age-related osteoporosis without current pathological fracture: Secondary | ICD-10-CM | POA: Diagnosis not present

## 2018-05-27 DIAGNOSIS — R2681 Unsteadiness on feet: Secondary | ICD-10-CM | POA: Diagnosis not present

## 2018-05-27 DIAGNOSIS — M79641 Pain in right hand: Secondary | ICD-10-CM | POA: Diagnosis not present

## 2018-05-28 DIAGNOSIS — M810 Age-related osteoporosis without current pathological fracture: Secondary | ICD-10-CM | POA: Diagnosis not present

## 2018-05-28 DIAGNOSIS — J439 Emphysema, unspecified: Secondary | ICD-10-CM | POA: Diagnosis not present

## 2018-05-28 DIAGNOSIS — M24541 Contracture, right hand: Secondary | ICD-10-CM | POA: Diagnosis not present

## 2018-05-28 DIAGNOSIS — R2681 Unsteadiness on feet: Secondary | ICD-10-CM | POA: Diagnosis not present

## 2018-05-28 DIAGNOSIS — M79641 Pain in right hand: Secondary | ICD-10-CM | POA: Diagnosis not present

## 2018-05-28 DIAGNOSIS — M109 Gout, unspecified: Secondary | ICD-10-CM | POA: Diagnosis not present

## 2018-05-28 DIAGNOSIS — R296 Repeated falls: Secondary | ICD-10-CM | POA: Diagnosis not present

## 2018-05-28 DIAGNOSIS — Z741 Need for assistance with personal care: Secondary | ICD-10-CM | POA: Diagnosis not present

## 2018-05-29 DIAGNOSIS — M79641 Pain in right hand: Secondary | ICD-10-CM | POA: Diagnosis not present

## 2018-05-29 DIAGNOSIS — M24541 Contracture, right hand: Secondary | ICD-10-CM | POA: Diagnosis not present

## 2018-05-29 DIAGNOSIS — M109 Gout, unspecified: Secondary | ICD-10-CM | POA: Diagnosis not present

## 2018-05-29 DIAGNOSIS — J439 Emphysema, unspecified: Secondary | ICD-10-CM | POA: Diagnosis not present

## 2018-05-29 DIAGNOSIS — R296 Repeated falls: Secondary | ICD-10-CM | POA: Diagnosis not present

## 2018-05-29 DIAGNOSIS — Z741 Need for assistance with personal care: Secondary | ICD-10-CM | POA: Diagnosis not present

## 2018-05-29 DIAGNOSIS — M810 Age-related osteoporosis without current pathological fracture: Secondary | ICD-10-CM | POA: Diagnosis not present

## 2018-05-29 DIAGNOSIS — M1990 Unspecified osteoarthritis, unspecified site: Secondary | ICD-10-CM | POA: Diagnosis not present

## 2018-05-29 DIAGNOSIS — J449 Chronic obstructive pulmonary disease, unspecified: Secondary | ICD-10-CM | POA: Diagnosis not present

## 2018-05-29 DIAGNOSIS — R2681 Unsteadiness on feet: Secondary | ICD-10-CM | POA: Diagnosis not present

## 2018-05-29 DIAGNOSIS — N184 Chronic kidney disease, stage 4 (severe): Secondary | ICD-10-CM | POA: Diagnosis not present

## 2018-05-30 DIAGNOSIS — M810 Age-related osteoporosis without current pathological fracture: Secondary | ICD-10-CM | POA: Diagnosis not present

## 2018-05-30 DIAGNOSIS — J439 Emphysema, unspecified: Secondary | ICD-10-CM | POA: Diagnosis not present

## 2018-05-30 DIAGNOSIS — M24541 Contracture, right hand: Secondary | ICD-10-CM | POA: Diagnosis not present

## 2018-05-30 DIAGNOSIS — R2681 Unsteadiness on feet: Secondary | ICD-10-CM | POA: Diagnosis not present

## 2018-05-30 DIAGNOSIS — M79641 Pain in right hand: Secondary | ICD-10-CM | POA: Diagnosis not present

## 2018-05-30 DIAGNOSIS — R296 Repeated falls: Secondary | ICD-10-CM | POA: Diagnosis not present

## 2018-05-30 DIAGNOSIS — M109 Gout, unspecified: Secondary | ICD-10-CM | POA: Diagnosis not present

## 2018-05-30 DIAGNOSIS — Z741 Need for assistance with personal care: Secondary | ICD-10-CM | POA: Diagnosis not present

## 2018-05-31 DIAGNOSIS — M810 Age-related osteoporosis without current pathological fracture: Secondary | ICD-10-CM | POA: Diagnosis not present

## 2018-05-31 DIAGNOSIS — R2681 Unsteadiness on feet: Secondary | ICD-10-CM | POA: Diagnosis not present

## 2018-05-31 DIAGNOSIS — M109 Gout, unspecified: Secondary | ICD-10-CM | POA: Diagnosis not present

## 2018-05-31 DIAGNOSIS — M24541 Contracture, right hand: Secondary | ICD-10-CM | POA: Diagnosis not present

## 2018-05-31 DIAGNOSIS — Z741 Need for assistance with personal care: Secondary | ICD-10-CM | POA: Diagnosis not present

## 2018-05-31 DIAGNOSIS — J439 Emphysema, unspecified: Secondary | ICD-10-CM | POA: Diagnosis not present

## 2018-05-31 DIAGNOSIS — M79641 Pain in right hand: Secondary | ICD-10-CM | POA: Diagnosis not present

## 2018-05-31 DIAGNOSIS — R296 Repeated falls: Secondary | ICD-10-CM | POA: Diagnosis not present

## 2018-06-03 DIAGNOSIS — R2681 Unsteadiness on feet: Secondary | ICD-10-CM | POA: Diagnosis not present

## 2018-06-03 DIAGNOSIS — M109 Gout, unspecified: Secondary | ICD-10-CM | POA: Diagnosis not present

## 2018-06-03 DIAGNOSIS — Z741 Need for assistance with personal care: Secondary | ICD-10-CM | POA: Diagnosis not present

## 2018-06-03 DIAGNOSIS — R296 Repeated falls: Secondary | ICD-10-CM | POA: Diagnosis not present

## 2018-06-03 DIAGNOSIS — J439 Emphysema, unspecified: Secondary | ICD-10-CM | POA: Diagnosis not present

## 2018-06-03 DIAGNOSIS — M24541 Contracture, right hand: Secondary | ICD-10-CM | POA: Diagnosis not present

## 2018-06-03 DIAGNOSIS — M810 Age-related osteoporosis without current pathological fracture: Secondary | ICD-10-CM | POA: Diagnosis not present

## 2018-06-03 DIAGNOSIS — M79641 Pain in right hand: Secondary | ICD-10-CM | POA: Diagnosis not present

## 2018-06-04 DIAGNOSIS — Z741 Need for assistance with personal care: Secondary | ICD-10-CM | POA: Diagnosis not present

## 2018-06-04 DIAGNOSIS — J439 Emphysema, unspecified: Secondary | ICD-10-CM | POA: Diagnosis not present

## 2018-06-04 DIAGNOSIS — R2681 Unsteadiness on feet: Secondary | ICD-10-CM | POA: Diagnosis not present

## 2018-06-04 DIAGNOSIS — M79641 Pain in right hand: Secondary | ICD-10-CM | POA: Diagnosis not present

## 2018-06-04 DIAGNOSIS — M24541 Contracture, right hand: Secondary | ICD-10-CM | POA: Diagnosis not present

## 2018-06-04 DIAGNOSIS — M810 Age-related osteoporosis without current pathological fracture: Secondary | ICD-10-CM | POA: Diagnosis not present

## 2018-06-04 DIAGNOSIS — M109 Gout, unspecified: Secondary | ICD-10-CM | POA: Diagnosis not present

## 2018-06-04 DIAGNOSIS — R296 Repeated falls: Secondary | ICD-10-CM | POA: Diagnosis not present

## 2018-06-05 DIAGNOSIS — Z741 Need for assistance with personal care: Secondary | ICD-10-CM | POA: Diagnosis not present

## 2018-06-05 DIAGNOSIS — R296 Repeated falls: Secondary | ICD-10-CM | POA: Diagnosis not present

## 2018-06-05 DIAGNOSIS — M24541 Contracture, right hand: Secondary | ICD-10-CM | POA: Diagnosis not present

## 2018-06-05 DIAGNOSIS — J439 Emphysema, unspecified: Secondary | ICD-10-CM | POA: Diagnosis not present

## 2018-06-05 DIAGNOSIS — M79641 Pain in right hand: Secondary | ICD-10-CM | POA: Diagnosis not present

## 2018-06-05 DIAGNOSIS — M109 Gout, unspecified: Secondary | ICD-10-CM | POA: Diagnosis not present

## 2018-06-05 DIAGNOSIS — R2681 Unsteadiness on feet: Secondary | ICD-10-CM | POA: Diagnosis not present

## 2018-06-05 DIAGNOSIS — M810 Age-related osteoporosis without current pathological fracture: Secondary | ICD-10-CM | POA: Diagnosis not present

## 2018-06-06 DIAGNOSIS — M24541 Contracture, right hand: Secondary | ICD-10-CM | POA: Diagnosis not present

## 2018-06-06 DIAGNOSIS — R296 Repeated falls: Secondary | ICD-10-CM | POA: Diagnosis not present

## 2018-06-06 DIAGNOSIS — M79641 Pain in right hand: Secondary | ICD-10-CM | POA: Diagnosis not present

## 2018-06-06 DIAGNOSIS — R2681 Unsteadiness on feet: Secondary | ICD-10-CM | POA: Diagnosis not present

## 2018-06-06 DIAGNOSIS — J439 Emphysema, unspecified: Secondary | ICD-10-CM | POA: Diagnosis not present

## 2018-06-06 DIAGNOSIS — Z741 Need for assistance with personal care: Secondary | ICD-10-CM | POA: Diagnosis not present

## 2018-06-07 DIAGNOSIS — E785 Hyperlipidemia, unspecified: Secondary | ICD-10-CM | POA: Diagnosis not present

## 2018-06-07 DIAGNOSIS — Z741 Need for assistance with personal care: Secondary | ICD-10-CM | POA: Diagnosis not present

## 2018-06-07 DIAGNOSIS — R296 Repeated falls: Secondary | ICD-10-CM | POA: Diagnosis not present

## 2018-06-07 DIAGNOSIS — E559 Vitamin D deficiency, unspecified: Secondary | ICD-10-CM | POA: Diagnosis not present

## 2018-06-07 DIAGNOSIS — R2681 Unsteadiness on feet: Secondary | ICD-10-CM | POA: Diagnosis not present

## 2018-06-07 DIAGNOSIS — E119 Type 2 diabetes mellitus without complications: Secondary | ICD-10-CM | POA: Diagnosis not present

## 2018-06-07 DIAGNOSIS — J439 Emphysema, unspecified: Secondary | ICD-10-CM | POA: Diagnosis not present

## 2018-06-07 DIAGNOSIS — M109 Gout, unspecified: Secondary | ICD-10-CM | POA: Diagnosis not present

## 2018-06-07 DIAGNOSIS — M24541 Contracture, right hand: Secondary | ICD-10-CM | POA: Diagnosis not present

## 2018-06-07 DIAGNOSIS — M79641 Pain in right hand: Secondary | ICD-10-CM | POA: Diagnosis not present

## 2018-06-10 DIAGNOSIS — R296 Repeated falls: Secondary | ICD-10-CM | POA: Diagnosis not present

## 2018-06-10 DIAGNOSIS — M79641 Pain in right hand: Secondary | ICD-10-CM | POA: Diagnosis not present

## 2018-06-10 DIAGNOSIS — J439 Emphysema, unspecified: Secondary | ICD-10-CM | POA: Diagnosis not present

## 2018-06-10 DIAGNOSIS — Z741 Need for assistance with personal care: Secondary | ICD-10-CM | POA: Diagnosis not present

## 2018-06-10 DIAGNOSIS — R2681 Unsteadiness on feet: Secondary | ICD-10-CM | POA: Diagnosis not present

## 2018-06-10 DIAGNOSIS — M24541 Contracture, right hand: Secondary | ICD-10-CM | POA: Diagnosis not present

## 2018-06-11 DIAGNOSIS — M24541 Contracture, right hand: Secondary | ICD-10-CM | POA: Diagnosis not present

## 2018-06-11 DIAGNOSIS — R2681 Unsteadiness on feet: Secondary | ICD-10-CM | POA: Diagnosis not present

## 2018-06-11 DIAGNOSIS — R296 Repeated falls: Secondary | ICD-10-CM | POA: Diagnosis not present

## 2018-06-11 DIAGNOSIS — M79641 Pain in right hand: Secondary | ICD-10-CM | POA: Diagnosis not present

## 2018-06-11 DIAGNOSIS — Z741 Need for assistance with personal care: Secondary | ICD-10-CM | POA: Diagnosis not present

## 2018-06-11 DIAGNOSIS — J439 Emphysema, unspecified: Secondary | ICD-10-CM | POA: Diagnosis not present

## 2018-06-12 DIAGNOSIS — R296 Repeated falls: Secondary | ICD-10-CM | POA: Diagnosis not present

## 2018-06-12 DIAGNOSIS — J439 Emphysema, unspecified: Secondary | ICD-10-CM | POA: Diagnosis not present

## 2018-06-12 DIAGNOSIS — Z741 Need for assistance with personal care: Secondary | ICD-10-CM | POA: Diagnosis not present

## 2018-06-12 DIAGNOSIS — M24541 Contracture, right hand: Secondary | ICD-10-CM | POA: Diagnosis not present

## 2018-06-12 DIAGNOSIS — M79641 Pain in right hand: Secondary | ICD-10-CM | POA: Diagnosis not present

## 2018-06-12 DIAGNOSIS — R2681 Unsteadiness on feet: Secondary | ICD-10-CM | POA: Diagnosis not present

## 2018-06-13 DIAGNOSIS — Z741 Need for assistance with personal care: Secondary | ICD-10-CM | POA: Diagnosis not present

## 2018-06-13 DIAGNOSIS — J439 Emphysema, unspecified: Secondary | ICD-10-CM | POA: Diagnosis not present

## 2018-06-13 DIAGNOSIS — R296 Repeated falls: Secondary | ICD-10-CM | POA: Diagnosis not present

## 2018-06-13 DIAGNOSIS — R2681 Unsteadiness on feet: Secondary | ICD-10-CM | POA: Diagnosis not present

## 2018-06-13 DIAGNOSIS — M79641 Pain in right hand: Secondary | ICD-10-CM | POA: Diagnosis not present

## 2018-06-13 DIAGNOSIS — M24541 Contracture, right hand: Secondary | ICD-10-CM | POA: Diagnosis not present

## 2018-06-14 DIAGNOSIS — R296 Repeated falls: Secondary | ICD-10-CM | POA: Diagnosis not present

## 2018-06-14 DIAGNOSIS — J439 Emphysema, unspecified: Secondary | ICD-10-CM | POA: Diagnosis not present

## 2018-06-14 DIAGNOSIS — M79641 Pain in right hand: Secondary | ICD-10-CM | POA: Diagnosis not present

## 2018-06-14 DIAGNOSIS — Z741 Need for assistance with personal care: Secondary | ICD-10-CM | POA: Diagnosis not present

## 2018-06-14 DIAGNOSIS — M24541 Contracture, right hand: Secondary | ICD-10-CM | POA: Diagnosis not present

## 2018-06-14 DIAGNOSIS — R2681 Unsteadiness on feet: Secondary | ICD-10-CM | POA: Diagnosis not present

## 2018-06-17 DIAGNOSIS — Z741 Need for assistance with personal care: Secondary | ICD-10-CM | POA: Diagnosis not present

## 2018-06-17 DIAGNOSIS — R296 Repeated falls: Secondary | ICD-10-CM | POA: Diagnosis not present

## 2018-06-17 DIAGNOSIS — R2681 Unsteadiness on feet: Secondary | ICD-10-CM | POA: Diagnosis not present

## 2018-06-17 DIAGNOSIS — J439 Emphysema, unspecified: Secondary | ICD-10-CM | POA: Diagnosis not present

## 2018-06-17 DIAGNOSIS — M24541 Contracture, right hand: Secondary | ICD-10-CM | POA: Diagnosis not present

## 2018-06-17 DIAGNOSIS — E441 Mild protein-calorie malnutrition: Secondary | ICD-10-CM | POA: Diagnosis not present

## 2018-06-17 DIAGNOSIS — M79641 Pain in right hand: Secondary | ICD-10-CM | POA: Diagnosis not present

## 2018-06-18 DIAGNOSIS — R296 Repeated falls: Secondary | ICD-10-CM | POA: Diagnosis not present

## 2018-06-18 DIAGNOSIS — J439 Emphysema, unspecified: Secondary | ICD-10-CM | POA: Diagnosis not present

## 2018-06-18 DIAGNOSIS — Z741 Need for assistance with personal care: Secondary | ICD-10-CM | POA: Diagnosis not present

## 2018-06-18 DIAGNOSIS — M24541 Contracture, right hand: Secondary | ICD-10-CM | POA: Diagnosis not present

## 2018-06-18 DIAGNOSIS — M79641 Pain in right hand: Secondary | ICD-10-CM | POA: Diagnosis not present

## 2018-06-18 DIAGNOSIS — R2681 Unsteadiness on feet: Secondary | ICD-10-CM | POA: Diagnosis not present

## 2018-06-28 DIAGNOSIS — K5900 Constipation, unspecified: Secondary | ICD-10-CM | POA: Diagnosis not present

## 2018-07-02 DIAGNOSIS — N184 Chronic kidney disease, stage 4 (severe): Secondary | ICD-10-CM | POA: Diagnosis not present

## 2018-07-02 DIAGNOSIS — J449 Chronic obstructive pulmonary disease, unspecified: Secondary | ICD-10-CM | POA: Diagnosis not present

## 2018-07-02 DIAGNOSIS — M1990 Unspecified osteoarthritis, unspecified site: Secondary | ICD-10-CM | POA: Diagnosis not present

## 2018-07-03 DIAGNOSIS — E213 Hyperparathyroidism, unspecified: Secondary | ICD-10-CM | POA: Diagnosis not present

## 2018-07-03 DIAGNOSIS — I770 Arteriovenous fistula, acquired: Secondary | ICD-10-CM | POA: Diagnosis not present

## 2018-07-03 DIAGNOSIS — E1122 Type 2 diabetes mellitus with diabetic chronic kidney disease: Secondary | ICD-10-CM | POA: Diagnosis not present

## 2018-07-03 DIAGNOSIS — I129 Hypertensive chronic kidney disease with stage 1 through stage 4 chronic kidney disease, or unspecified chronic kidney disease: Secondary | ICD-10-CM | POA: Diagnosis not present

## 2018-07-03 DIAGNOSIS — N184 Chronic kidney disease, stage 4 (severe): Secondary | ICD-10-CM | POA: Diagnosis not present

## 2018-07-10 ENCOUNTER — Telehealth: Payer: Self-pay | Admitting: *Deleted

## 2018-07-10 NOTE — Telephone Encounter (Signed)
Vm from [place] w/ Advance CAPS program Pt is to be recertified for [facility] talked yesterday going over contact numbers together She was not able to reach anyone or numbers are disconnected She did get a hold of [place] who was the caregiver informed Eustaquio Maize that pt was in an assisted living facility

## 2018-07-11 DIAGNOSIS — S060X0A Concussion without loss of consciousness, initial encounter: Secondary | ICD-10-CM | POA: Diagnosis not present

## 2018-07-16 ENCOUNTER — Telehealth: Payer: Self-pay | Admitting: Family Medicine

## 2018-07-17 ENCOUNTER — Ambulatory Visit: Payer: Medicare Other | Admitting: Family Medicine

## 2018-07-18 DIAGNOSIS — E441 Mild protein-calorie malnutrition: Secondary | ICD-10-CM | POA: Diagnosis not present

## 2018-07-19 DIAGNOSIS — E559 Vitamin D deficiency, unspecified: Secondary | ICD-10-CM | POA: Diagnosis not present

## 2018-07-19 DIAGNOSIS — E785 Hyperlipidemia, unspecified: Secondary | ICD-10-CM | POA: Diagnosis not present

## 2018-07-19 DIAGNOSIS — E119 Type 2 diabetes mellitus without complications: Secondary | ICD-10-CM | POA: Diagnosis not present

## 2018-07-19 DIAGNOSIS — J439 Emphysema, unspecified: Secondary | ICD-10-CM | POA: Diagnosis not present

## 2018-07-19 DIAGNOSIS — M109 Gout, unspecified: Secondary | ICD-10-CM | POA: Diagnosis not present

## 2018-07-22 ENCOUNTER — Encounter: Payer: Self-pay | Admitting: Family Medicine

## 2018-07-23 DIAGNOSIS — I480 Paroxysmal atrial fibrillation: Secondary | ICD-10-CM | POA: Diagnosis not present

## 2018-07-23 DIAGNOSIS — J90 Pleural effusion, not elsewhere classified: Secondary | ICD-10-CM | POA: Diagnosis not present

## 2018-07-23 DIAGNOSIS — K219 Gastro-esophageal reflux disease without esophagitis: Secondary | ICD-10-CM | POA: Diagnosis not present

## 2018-07-23 DIAGNOSIS — J9811 Atelectasis: Secondary | ICD-10-CM | POA: Diagnosis not present

## 2018-07-23 DIAGNOSIS — N189 Chronic kidney disease, unspecified: Secondary | ICD-10-CM | POA: Diagnosis not present

## 2018-07-23 DIAGNOSIS — E7220 Disorder of urea cycle metabolism, unspecified: Secondary | ICD-10-CM | POA: Diagnosis not present

## 2018-07-23 DIAGNOSIS — J9819 Other pulmonary collapse: Secondary | ICD-10-CM | POA: Diagnosis not present

## 2018-07-23 DIAGNOSIS — N200 Calculus of kidney: Secondary | ICD-10-CM | POA: Diagnosis not present

## 2018-07-23 DIAGNOSIS — I519 Heart disease, unspecified: Secondary | ICD-10-CM | POA: Diagnosis not present

## 2018-07-23 DIAGNOSIS — J438 Other emphysema: Secondary | ICD-10-CM | POA: Diagnosis not present

## 2018-07-23 DIAGNOSIS — I700 Atherosclerosis of aorta: Secondary | ICD-10-CM | POA: Diagnosis not present

## 2018-07-23 DIAGNOSIS — Z87891 Personal history of nicotine dependence: Secondary | ICD-10-CM | POA: Diagnosis not present

## 2018-07-23 DIAGNOSIS — J9601 Acute respiratory failure with hypoxia: Secondary | ICD-10-CM | POA: Diagnosis not present

## 2018-07-23 DIAGNOSIS — N289 Disorder of kidney and ureter, unspecified: Secondary | ICD-10-CM | POA: Diagnosis not present

## 2018-07-23 DIAGNOSIS — Z66 Do not resuscitate: Secondary | ICD-10-CM | POA: Diagnosis not present

## 2018-07-23 DIAGNOSIS — R918 Other nonspecific abnormal finding of lung field: Secondary | ICD-10-CM | POA: Diagnosis not present

## 2018-07-23 DIAGNOSIS — N184 Chronic kidney disease, stage 4 (severe): Secondary | ICD-10-CM | POA: Diagnosis not present

## 2018-07-23 DIAGNOSIS — Z79899 Other long term (current) drug therapy: Secondary | ICD-10-CM | POA: Diagnosis not present

## 2018-07-23 DIAGNOSIS — N179 Acute kidney failure, unspecified: Secondary | ICD-10-CM | POA: Diagnosis not present

## 2018-07-23 DIAGNOSIS — J439 Emphysema, unspecified: Secondary | ICD-10-CM | POA: Diagnosis not present

## 2018-07-23 DIAGNOSIS — Z0389 Encounter for observation for other suspected diseases and conditions ruled out: Secondary | ICD-10-CM | POA: Diagnosis not present

## 2018-07-23 DIAGNOSIS — E785 Hyperlipidemia, unspecified: Secondary | ICD-10-CM | POA: Diagnosis not present

## 2018-07-23 DIAGNOSIS — M838 Other adult osteomalacia: Secondary | ICD-10-CM | POA: Diagnosis not present

## 2018-07-23 DIAGNOSIS — J910 Malignant pleural effusion: Secondary | ICD-10-CM | POA: Diagnosis not present

## 2018-07-23 DIAGNOSIS — C3492 Malignant neoplasm of unspecified part of left bronchus or lung: Secondary | ICD-10-CM | POA: Diagnosis not present

## 2018-07-23 DIAGNOSIS — K5909 Other constipation: Secondary | ICD-10-CM | POA: Diagnosis not present

## 2018-07-23 DIAGNOSIS — R069 Unspecified abnormalities of breathing: Secondary | ICD-10-CM | POA: Diagnosis not present

## 2018-07-23 DIAGNOSIS — R410 Disorientation, unspecified: Secondary | ICD-10-CM | POA: Diagnosis not present

## 2018-07-23 DIAGNOSIS — I358 Other nonrheumatic aortic valve disorders: Secondary | ICD-10-CM | POA: Diagnosis not present

## 2018-07-23 DIAGNOSIS — R0902 Hypoxemia: Secondary | ICD-10-CM | POA: Diagnosis not present

## 2018-07-23 DIAGNOSIS — E870 Hyperosmolality and hypernatremia: Secondary | ICD-10-CM | POA: Diagnosis not present

## 2018-07-23 DIAGNOSIS — I959 Hypotension, unspecified: Secondary | ICD-10-CM | POA: Diagnosis not present

## 2018-07-23 DIAGNOSIS — R531 Weakness: Secondary | ICD-10-CM | POA: Diagnosis not present

## 2018-07-23 DIAGNOSIS — G9341 Metabolic encephalopathy: Secondary | ICD-10-CM | POA: Diagnosis not present

## 2018-07-23 DIAGNOSIS — R0602 Shortness of breath: Secondary | ICD-10-CM | POA: Diagnosis not present

## 2018-07-23 DIAGNOSIS — R590 Localized enlarged lymph nodes: Secondary | ICD-10-CM | POA: Diagnosis not present

## 2018-07-23 DIAGNOSIS — C384 Malignant neoplasm of pleura: Secondary | ICD-10-CM | POA: Diagnosis not present

## 2018-07-23 DIAGNOSIS — C7931 Secondary malignant neoplasm of brain: Secondary | ICD-10-CM | POA: Diagnosis not present

## 2018-07-23 DIAGNOSIS — Z515 Encounter for palliative care: Secondary | ICD-10-CM | POA: Diagnosis not present

## 2018-07-23 DIAGNOSIS — J432 Centrilobular emphysema: Secondary | ICD-10-CM | POA: Diagnosis not present

## 2018-07-23 DIAGNOSIS — Z743 Need for continuous supervision: Secondary | ICD-10-CM | POA: Diagnosis not present

## 2018-07-23 DIAGNOSIS — M109 Gout, unspecified: Secondary | ICD-10-CM | POA: Diagnosis not present

## 2018-07-23 DIAGNOSIS — R93422 Abnormal radiologic findings on diagnostic imaging of left kidney: Secondary | ICD-10-CM | POA: Diagnosis not present

## 2018-07-23 DIAGNOSIS — R279 Unspecified lack of coordination: Secondary | ICD-10-CM | POA: Diagnosis not present

## 2018-07-23 DIAGNOSIS — Z888 Allergy status to other drugs, medicaments and biological substances status: Secondary | ICD-10-CM | POA: Diagnosis not present

## 2018-07-23 DIAGNOSIS — E213 Hyperparathyroidism, unspecified: Secondary | ICD-10-CM | POA: Diagnosis not present

## 2018-07-23 DIAGNOSIS — E1122 Type 2 diabetes mellitus with diabetic chronic kidney disease: Secondary | ICD-10-CM | POA: Diagnosis not present

## 2018-07-23 DIAGNOSIS — Z86018 Personal history of other benign neoplasm: Secondary | ICD-10-CM | POA: Diagnosis not present

## 2018-07-23 DIAGNOSIS — I4891 Unspecified atrial fibrillation: Secondary | ICD-10-CM | POA: Diagnosis not present

## 2018-09-06 DEATH — deceased

## 2018-10-21 ENCOUNTER — Ambulatory Visit: Payer: Medicare Other | Admitting: *Deleted
